# Patient Record
Sex: Female | Born: 1950 | Race: White | Hispanic: No | Smoking: Former smoker
Health system: Southern US, Community
[De-identification: ages and names within clinical notes are randomized; demographics above are authoritative.]

## PROBLEM LIST (undated history)

## (undated) DIAGNOSIS — M810 Age-related osteoporosis without current pathological fracture: Secondary | ICD-10-CM

## (undated) DIAGNOSIS — T7840XA Allergy, unspecified, initial encounter: Secondary | ICD-10-CM

## (undated) DIAGNOSIS — R112 Nausea with vomiting, unspecified: Secondary | ICD-10-CM

## (undated) DIAGNOSIS — Z9889 Other specified postprocedural states: Secondary | ICD-10-CM

## (undated) DIAGNOSIS — D689 Coagulation defect, unspecified: Secondary | ICD-10-CM

## (undated) DIAGNOSIS — M1990 Unspecified osteoarthritis, unspecified site: Secondary | ICD-10-CM

## (undated) DIAGNOSIS — R339 Retention of urine, unspecified: Secondary | ICD-10-CM

## (undated) DIAGNOSIS — C801 Malignant (primary) neoplasm, unspecified: Secondary | ICD-10-CM

## (undated) DIAGNOSIS — T8859XA Other complications of anesthesia, initial encounter: Secondary | ICD-10-CM

## (undated) DIAGNOSIS — Z8619 Personal history of other infectious and parasitic diseases: Secondary | ICD-10-CM

## (undated) DIAGNOSIS — D649 Anemia, unspecified: Secondary | ICD-10-CM

## (undated) DIAGNOSIS — R519 Headache, unspecified: Secondary | ICD-10-CM

## (undated) DIAGNOSIS — F32A Depression, unspecified: Secondary | ICD-10-CM

## (undated) DIAGNOSIS — F319 Bipolar disorder, unspecified: Secondary | ICD-10-CM

## (undated) HISTORY — DX: Personal history of other infectious and parasitic diseases: Z86.19

## (undated) HISTORY — DX: Coagulation defect, unspecified: D68.9

## (undated) HISTORY — DX: Allergy, unspecified, initial encounter: T78.40XA

## (undated) HISTORY — DX: Unspecified osteoarthritis, unspecified site: M19.90

## (undated) HISTORY — DX: Age-related osteoporosis without current pathological fracture: M81.0

## (undated) HISTORY — DX: Malignant (primary) neoplasm, unspecified: C80.1

## (undated) HISTORY — PX: FRACTURE SURGERY: SHX138

## (undated) HISTORY — DX: Depression, unspecified: F32.A

---

## 1973-09-24 DIAGNOSIS — I639 Cerebral infarction, unspecified: Secondary | ICD-10-CM

## 1973-09-24 HISTORY — DX: Cerebral infarction, unspecified: I63.9

## 1977-09-24 HISTORY — PX: ABDOMINAL HYSTERECTOMY: SHX81

## 1998-09-24 DIAGNOSIS — F319 Bipolar disorder, unspecified: Secondary | ICD-10-CM | POA: Insufficient documentation

## 2009-01-16 DIAGNOSIS — M5137 Other intervertebral disc degeneration, lumbosacral region: Secondary | ICD-10-CM | POA: Insufficient documentation

## 2015-10-11 DIAGNOSIS — F32A Depression, unspecified: Secondary | ICD-10-CM | POA: Insufficient documentation

## 2015-10-11 DIAGNOSIS — M1990 Unspecified osteoarthritis, unspecified site: Secondary | ICD-10-CM | POA: Insufficient documentation

## 2015-10-11 DIAGNOSIS — F329 Major depressive disorder, single episode, unspecified: Secondary | ICD-10-CM | POA: Insufficient documentation

## 2015-10-12 ENCOUNTER — Ambulatory Visit (INDEPENDENT_AMBULATORY_CARE_PROVIDER_SITE_OTHER): Payer: Medicare Other | Admitting: Family Medicine

## 2015-10-12 ENCOUNTER — Encounter: Payer: Self-pay | Admitting: Family Medicine

## 2015-10-12 VITALS — BP 90/60 | HR 76 | Temp 98.0°F | Resp 16 | Ht 65.0 in | Wt 133.0 lb

## 2015-10-12 DIAGNOSIS — Z1159 Encounter for screening for other viral diseases: Secondary | ICD-10-CM

## 2015-10-12 DIAGNOSIS — Z0000 Encounter for general adult medical examination without abnormal findings: Secondary | ICD-10-CM

## 2015-10-12 DIAGNOSIS — Z1239 Encounter for other screening for malignant neoplasm of breast: Secondary | ICD-10-CM

## 2015-10-12 NOTE — Progress Notes (Deleted)
Patient: Pamela Henson, Female    DOB: 1951/04/13, 65 y.o.   MRN: [id number] Visit Date: 10/12/2015  Today's Provider: Lelon Huh, MD   Chief Complaint  Patient presents with  . Annual Exam  . Follow-up    Bipolar affective disorder and Degeneration of Lunbar disc   Subjective:    Annual physical exam Pamela Henson is a 65 y.o. female who presents today for health maintenance and complete physical. She feels {DESC; WELL/FAIRLY WELL/POORLY:18703}. She reports exercising ***. She reports she is sleeping {DESC; WELL/FAIRLY WELL/POORLY:18703}.  ----------------------------------------------------------------- Follow up Bipolar affective disorder:  Last office visit was 4 years ago and no changes were made. This condition was followed by Dr. Caprice Beaver.   Follow up Degeneration of Lumbar disc:  Last office visit was 4 years ago and patient was started on Lidoderm patches.    Review of Systems  Social History      She  reports that she quit smoking about 17 years ago. She does not have any smokeless tobacco history on file. She reports that she does not drink alcohol or use illicit drugs.       Social History   Social History  . Marital Status: Widowed    Spouse Name: N/A  . Number of Children: N/A  . Years of Education: N/A   Social History Main Topics  . Smoking status: Former Smoker -- 15 years    Quit date: 09/24/1998  . Smokeless tobacco: Not on file  . Alcohol Use: No  . Drug Use: No  . Sexual Activity: Not on file   Other Topics Concern  . Not on file   Social History Narrative  . No narrative on file    Past Medical History  Diagnosis Date  . Bipolar disorder ([place])   . History of chicken pox   . History of measles   . History of mumps   . Depression   . Arthritis      Patient Active Problem List   Diagnosis Date Noted  . Arthritis 10/11/2015  . Clinical depression 10/11/2015  . Degeneration of lumbar or lumbosacral intervertebral disc  01/16/2009  . Bipolar affective disorder ([place]) 09/24/1998    Past Surgical History  Procedure Laterality Date  . Abdominal hysterectomy  1979    age 31 due to vaginal bleeding    Family History        Family Status  Relation Status Death Age  . Mother Deceased 54    stroke  . Father Deceased 43    stroke        Her family history includes Stroke (age of onset: 51) in her mother; Stroke (age of onset: 71) in her father.    Allergies no known allergies  Previous Medications   GABAPENTIN (NEURONTIN) 400 MG CAPSULE    Take 1 capsule by mouth daily.   LIDOCAINE (LIDODERM) 5 %    1-3 Patch for no more than 12 hours per day    Patient Care Team: Birdie Sons, MD as PCP - General (Family Medicine) Historical Provider, MD     Objective:   Vitals: There were no vitals taken for this visit.   Physical Exam   Depression Screen No flowsheet data found.    Assessment & Plan:     Routine Health Maintenance and Physical Exam  Exercise Activities and Dietary recommendations Goals    None       There is no immunization history on file  for this patient.  Health Maintenance  Topic Date Due  . Hepatitis C Screening  1951-09-03  . HIV Screening  12/24/1967  . TETANUS/TDAP  12/24/1971  . PAP SMEAR  12/23/1973  . MAMMOGRAM  12/24/2002  . COLONOSCOPY  12/24/2002  . ZOSTAVAX  12/23/2012  . INFLUENZA VACCINE  04/25/2015      Discussed health benefits of physical activity, and encouraged her to engage in regular exercise appropriate for her age and condition.    --------------------------------------------------------------------

## 2015-10-12 NOTE — Progress Notes (Signed)
Patient: Pamela Henson, Female    DOB: 04-06-1951, 65 y.o.   MRN: [id number] Visit Date: 10/12/2015  Today's Provider: Lelon Huh, MD   Chief Complaint  Patient presents with  . Annual Exam  . Follow-up    Bipolar affective disorder and Degeneration of Lunbar disc   Subjective:    Annual exam  Pamela Henson is a 65 y.o. female. She feels well. She reports exercising once a week dancing and walking. She reports she is sleeping fairly well.  ----------------------------------------------------------- Follow up Bipolar affective disorder/ Depression:  Last office visit was 4 years ago and no changes were made. This condition is followed by Dr. Caprice Beaver. Since last office visit, patient has been following up with Dr. Caprice Beaver every 3 months. Patient has been started on Zoloft and EnLyte. Gabapentin dosage was decreased. Patient feels her symptoms are now controlled with theses changes.  she has been intolerant to many antidepressants. She had [employer] testing to evaluate her potential genetic reactions to medications. She states that Dr. Caprice Beaver subsequent prescribed [facility] which is a folate replacement which has worked great for her. She has no other complaints today. Patient now sees Dr. Caprice Beaver every 6 months.    Follow up Degeneration of Lumbar disc:  Last office visit was 4 years ago and patient was started on Lidoderm patches. Patient states she has not used the Lidoderm patches for several years. Patient has been using OTC pain patches which has helped with back pain.    Review of Systems  Constitutional: Negative for fever, chills and fatigue.  HENT: Positive for congestion. Negative for ear pain, rhinorrhea, sneezing and sore throat.   Eyes: Negative.  Negative for pain and redness.  Respiratory: Positive for cough (productive with clear phlegm). Negative for shortness of breath and wheezing.   Cardiovascular: Negative for chest pain and leg swelling.    Gastrointestinal: Negative for nausea, abdominal pain, diarrhea, constipation and blood in stool.  Endocrine: Negative for polydipsia and polyphagia.  Genitourinary: Negative.  Negative for dysuria, hematuria, flank pain, vaginal bleeding, vaginal discharge and pelvic pain.  Musculoskeletal: Positive for back pain (stable). Negative for joint swelling, arthralgias and gait problem.  Skin: Negative for rash.  Neurological: Negative.  Negative for dizziness, tremors, seizures, weakness, light-headedness, numbness and headaches.  Hematological: Negative for adenopathy.  Psychiatric/Behavioral: Negative.  Negative for behavioral problems, confusion and dysphoric mood. The patient is not nervous/anxious and is not hyperactive.     Social History   Social History  . Marital Status: Widowed    Spouse Name: N/A  . Number of Children: 1  . Years of Education: N/A   Occupational History  . Disablity     Due to back problems   Social History Main Topics  . Smoking status: Former Smoker -- 15 years    Quit date: 09/24/1998  . Smokeless tobacco: Not on file  . Alcohol Use: No  . Drug Use: No  . Sexual Activity: Not on file   Other Topics Concern  . Not on file   Social History Narrative    Past Medical History  Diagnosis Date  . Bipolar disorder ([place])   . History of chicken pox   . History of measles   . History of mumps   . Depression   . Arthritis      Patient Active Problem List   Diagnosis Date Noted  . Arthritis 10/11/2015  . Clinical depression 10/11/2015  . Degeneration of lumbar  or lumbosacral intervertebral disc 01/16/2009  . Bipolar affective disorder ([place]) 09/24/1998    Past Surgical History  Procedure Laterality Date  . Abdominal hysterectomy  1979    age 53 due to vaginal bleeding    Her family history includes Stroke (age of onset: 48) in her mother; Stroke (age of onset: 34) in her father.    Previous Medications   DIETARY MANAGEMENT PRODUCT (ENLYTE  PO)    Take 1 capsule by mouth daily.   GABAPENTIN (NEURONTIN) 100 MG CAPSULE    Take 100 mg by mouth daily.   LIDOCAINE (LIDODERM) 5 %    Reported on 10/12/2015   SERTRALINE (ZOLOFT) 25 MG TABLET    Take 25 mg by mouth daily.    Patient Care Team: Birdie Sons, MD as PCP - General (Family Medicine) Historical Provider, MD Sheralyn Boatman, MD as Consulting Physician (Psychiatry)     Objective:   Vitals: BP 90/60 mmHg  Pulse 76  Temp(Src) 98 F (36.7 C) (Oral)  Resp 16  Ht 5\' 5"  (1.651 m)  Wt 133 lb (60.328 kg)  BMI 22.13 kg/m2  SpO2 93%  Physical Exam    General Appearance:    Alert, cooperative, no distress, appears stated age  Head:    Normocephalic, without obvious abnormality, atraumatic  Eyes:    PERRL, conjunctiva/corneas clear, EOM's intact, fundi    benign, both eyes  Ears:    Normal TM's and external ear canals, both ears  Nose:   Nares normal, septum midline, mucosa normal, no drainage    or sinus tenderness  Throat:   Lips, mucosa, and tongue normal; teeth and gums normal  Neck:   Supple, symmetrical, trachea midline, no adenopathy;    thyroid:  no enlargement/tenderness/nodules; no carotid   bruit or JVD  Back:     Symmetric, no curvature, ROM normal, no CVA tenderness  Lungs:     Clear to auscultation bilaterally, respirations unlabored  Chest Wall:    No tenderness or deformity   Heart:    Regular rate and rhythm, S1 and S2 normal, no murmur, rub   or gallop  Breast Exam:    normal appearance, no masses or tenderness  Abdomen:     Soft, non-tender, bowel sounds active all four quadrants,    no masses, no organomegaly  Pelvic:    deferred  Extremities:   Extremities normal, atraumatic, no cyanosis or edema  Pulses:   2+ and symmetric all extremities  Skin:   Skin color, texture, turgor normal, no rashes or lesions  Lymph nodes:   Cervical, supraclavicular, and axillary nodes normal  Neurologic:   CNII-XII intact, normal strength, sensation and  reflexes    throughout    Activities of Daily Living In your present state of health, do you have any difficulty performing the following activities: 10/12/2015  Hearing? N  Vision? N  Difficulty concentrating or making decisions? N  Walking or climbing stairs? N  Dressing or bathing? N  Doing errands, shopping? N    Fall Risk Assessment Fall Risk  10/12/2015  Falls in the past year? No     Depression Screen PHQ 2/9 Scores 10/12/2015  PHQ - 2 Score 0    Cognitive Testing - 6-CIT  Correct? Score   What year is it? yes 0 0 or 4  What month is it? yes 0 0 or 3  Memorize:    Pia Mau,  [address]  [place],  What time is it? (within 1 hour) yes 0 0 or 3  Count backwards from 20 yes 0 0, 2, or 4  Name the months of the year yes 0 0, 2, or 4  Repeat name & address above yes 0 0, 2, 4, 6, 8, or 10       TOTAL SCORE  0/28   Interpretation:  Normal  Normal (0-7) Abnormal (8-28)     Audit-C Alcohol Use Screening  Question Answer Points  How often do you have alcoholic drink? never 0  On days you do drink alcohol, how many drinks do you typically consume? 0 0  How oftey will you drink 6 or more in a total? never 0  Total Score:  0   A score of 3 or more in women, and 4 or more in men indicates increased risk for alcohol abuse, EXCEPT if all of the points are from question 1.   Assessment & Plan:     Annual Wellness Visit  Reviewed patient's Family Medical History Reviewed and updated list of patient's medical providers Assessment of cognitive impairment was done Assessed patient's functional ability Established a written schedule for health screening [place] Completed and Reviewed  Exercise Activities and Dietary recommendations Goals    None       There is no immunization history on file for this patient.  Health Maintenance  Topic Date Due  . Hepatitis C Screening  June 30, 1951  . HIV Screening  12/24/1967  . TETANUS/TDAP   12/24/1971  . PAP SMEAR  12/23/1973  . MAMMOGRAM  12/24/2002  . COLONOSCOPY  12/24/2002  . ZOSTAVAX  12/23/2012  . INFLUENZA VACCINE  12/23/2015 (Originally 04/25/2015)      Discussed health benefits of physical activity, and encouraged her to engage in regular exercise appropriate for her age and condition.    ------------------------------------------------------------------------------------------------------------  1. Annual physical exam Doing well.  - Basic metabolic panel - Lipid panel  2. Screening for breast cancer  - MM DIGITAL SCREENING BILATERAL; Future  3. Need for hepatitis C screening test  - Hepatitis C antibody

## 2015-10-13 ENCOUNTER — Encounter: Payer: Self-pay | Admitting: Family Medicine

## 2015-10-19 ENCOUNTER — Telehealth: Payer: Self-pay

## 2015-10-19 LAB — BASIC METABOLIC PANEL
BUN/Creatinine Ratio: 24 (ref 11–26)
BUN: 16 mg/dL (ref 8–27)
CO2: 21 mmol/L (ref 18–29)
CREATININE: 0.66 mg/dL (ref 0.57–1.00)
Calcium: 9.2 mg/dL (ref 8.7–10.3)
Chloride: 105 mmol/L (ref 96–106)
GFR calc Af Amer: 109 mL/min/{1.73_m2} (ref >59)
GFR, EST NON AFRICAN AMERICAN: 95 mL/min/{1.73_m2} (ref >59)
GLUCOSE: 107 mg/dL — AB (ref 65–99)
Potassium: 4.4 mmol/L (ref 3.5–5.2)
Sodium: 144 mmol/L (ref 134–144)

## 2015-10-19 LAB — LIPID PANEL
CHOL/HDL RATIO: 3.5 ratio (ref 0.0–4.4)
CHOLESTEROL TOTAL: 202 mg/dL — AB (ref 100–199)
HDL: 57 mg/dL (ref >39)
LDL CALC: 119 mg/dL — AB (ref 0–99)
TRIGLYCERIDES: 128 mg/dL (ref 0–149)
VLDL CHOLESTEROL CAL: 26 mg/dL (ref 5–40)

## 2015-10-19 LAB — HEPATITIS C ANTIBODY: Hep C Virus Ab: <0.1 s/co ratio (ref 0.0–0.9)

## 2015-10-19 NOTE — Telephone Encounter (Signed)
-----   Message from Birdie Sons, MD sent at 10/19/2015 12:34 PM EST ----- Blood sugar, kidney functions, electrolytes and cholesterol are all normal. Check labs yearly.

## 2015-10-19 NOTE — Telephone Encounter (Signed)
Pt advised.   Thanks,   -Layna Roeper  

## 2015-10-31 ENCOUNTER — Ambulatory Visit (HOSPITAL_COMMUNITY)
Admission: RE | Admit: 2015-10-31 | Discharge: 2015-10-31 | Disposition: A | Discharge status: Home / Self Care | Payer: Medicare Other | Source: Ambulatory Visit | Attending: Family Medicine | Admitting: Family Medicine

## 2015-10-31 DIAGNOSIS — Z1239 Encounter for other screening for malignant neoplasm of breast: Secondary | ICD-10-CM

## 2015-10-31 DIAGNOSIS — Z1231 Encounter for screening mammogram for malignant neoplasm of breast: Secondary | ICD-10-CM | POA: Diagnosis not present

## 2016-01-11 DIAGNOSIS — M9902 Segmental and somatic dysfunction of thoracic region: Secondary | ICD-10-CM | POA: Diagnosis not present

## 2016-01-11 DIAGNOSIS — M9903 Segmental and somatic dysfunction of lumbar region: Secondary | ICD-10-CM | POA: Diagnosis not present

## 2016-01-11 DIAGNOSIS — M9905 Segmental and somatic dysfunction of pelvic region: Secondary | ICD-10-CM | POA: Diagnosis not present

## 2016-01-11 DIAGNOSIS — M545 Low back pain: Secondary | ICD-10-CM | POA: Diagnosis not present

## 2016-03-14 DIAGNOSIS — H524 Presbyopia: Secondary | ICD-10-CM | POA: Diagnosis not present

## 2016-04-04 DIAGNOSIS — M545 Low back pain: Secondary | ICD-10-CM | POA: Diagnosis not present

## 2016-04-04 DIAGNOSIS — M9905 Segmental and somatic dysfunction of pelvic region: Secondary | ICD-10-CM | POA: Diagnosis not present

## 2016-04-04 DIAGNOSIS — M9903 Segmental and somatic dysfunction of lumbar region: Secondary | ICD-10-CM | POA: Diagnosis not present

## 2016-04-04 DIAGNOSIS — M9902 Segmental and somatic dysfunction of thoracic region: Secondary | ICD-10-CM | POA: Diagnosis not present

## 2016-05-23 DIAGNOSIS — N390 Urinary tract infection, site not specified: Secondary | ICD-10-CM | POA: Diagnosis not present

## 2016-05-23 DIAGNOSIS — N3000 Acute cystitis without hematuria: Secondary | ICD-10-CM | POA: Diagnosis not present

## 2016-05-25 ENCOUNTER — Encounter: Payer: Self-pay | Admitting: Family Medicine

## 2016-05-25 ENCOUNTER — Ambulatory Visit (INDEPENDENT_AMBULATORY_CARE_PROVIDER_SITE_OTHER): Payer: Medicare Other | Admitting: Family Medicine

## 2016-05-25 VITALS — BP 104/70 | HR 81 | Temp 98.1°F | Resp 16 | Ht 65.0 in | Wt 140.0 lb

## 2016-05-25 DIAGNOSIS — R350 Frequency of micturition: Secondary | ICD-10-CM | POA: Diagnosis not present

## 2016-05-25 DIAGNOSIS — N390 Urinary tract infection, site not specified: Secondary | ICD-10-CM | POA: Diagnosis not present

## 2016-05-25 LAB — POCT URINALYSIS DIPSTICK
Bilirubin, UA: NEGATIVE
GLUCOSE UA: NEGATIVE
NITRITE UA: NEGATIVE
Protein, UA: NEGATIVE
Spec Grav, UA: 1.01
UROBILINOGEN UA: 0.2
pH, UA: 7

## 2016-05-25 MED ORDER — SULFAMETHOXAZOLE-TRIMETHOPRIM 800-160 MG PO TABS: 1.0000 | ORAL_TABLET | Freq: Two times a day (BID) | ORAL | 0 refills | Status: AC

## 2016-05-25 MED ORDER — TAMSULOSIN HCL 0.4 MG PO CAPS: 0.4000 mg | ORAL_CAPSULE | Freq: Every day | ORAL | 0 refills | Status: DC

## 2016-05-25 NOTE — Progress Notes (Signed)
Patient: Pamela Henson Female    DOB: 03/17/1951   65 y.o.   MRN: [id number] Visit Date: 05/25/2016  Today's Provider: Lelon Huh, MD   Chief Complaint  Patient presents with  . Dysuria   Subjective:    Patient started having painful urination 5 days ago. Symptoms of pelvic pain, urgency, frequency and burning when voiding. Patient went to urgent care in [facility] Wednesday 05/23/2016 and was prescribed cipro. Patient states that she has had no relief.   Dysuria   This is a new problem. The current episode started in the past 7 days (6 days ago). The problem occurs every urination. The problem has been unchanged. The quality of the pain is described as burning, aching and stabbing. The pain is at a severity of 9/10. The pain is severe. There has been no fever. Associated symptoms include frequency. Pertinent negatives include no chills, discharge, flank pain, hematuria, hesitancy, nausea, possible pregnancy, sweats, urgency or vomiting. She has tried antibiotics (cipro 500) for the symptoms. The treatment provided no relief.  states she was up all night last night having to void several times with very little urine flow.     No Known Allergies Current Meds  Medication Sig  . Dietary Management Product (ENLYTE PO) Take 1 capsule by mouth daily.  [name] Kitchen gabapentin (NEURONTIN) 100 MG capsule Take 100 mg by mouth daily.  [name] Kitchen lidocaine (LIDODERM) 5 % Reported on 10/12/2015  . sertraline (ZOLOFT) 25 MG tablet Take 25 mg by mouth daily.    Review of Systems  Constitutional: Negative for appetite change, chills, fatigue and fever.  Respiratory: Negative for chest tightness and shortness of breath.   Cardiovascular: Negative for chest pain and palpitations.  Gastrointestinal: Negative for abdominal pain, nausea and vomiting.  Genitourinary: Positive for dysuria, frequency, pelvic pain and vaginal pain. Negative for flank pain, hematuria, hesitancy, urgency, vaginal bleeding and vaginal  discharge.  Neurological: Negative for dizziness and weakness.    Social History  Substance Use Topics  . Smoking status: Former Smoker    Packs/day: 1.00    Years: 15.00    Types: Cigarettes    Quit date: 09/24/1998  . Smokeless tobacco: Not on file  . Alcohol use No   Objective:   BP 104/70 (BP Location: Left Arm, Patient Position: Sitting, Cuff Size: Normal)   Pulse 81   Temp 98.1 F (36.7 C) (Oral)   Resp 16   Ht 5\' 5"  (1.651 m)   Wt 140 lb (63.5 kg)   SpO2 92%   BMI 23.30 kg/m   Physical Exam  General Appearance:    Alert, cooperative, no distress  Eyes:    PERRL, conjunctiva/corneas clear, EOM's intact       Lungs:     Clear to auscultation bilaterally, respirations unlabored  Heart:    Regular rate and rhythm  Abdomen:   bowel sounds present and normal in all 4 quadrants, soft, round or nontender. No CVA tenderness. Fullness of bladder.          Assessment & Plan:     1. Urinary frequency Unclear if this is entirely due to UTI or if she is having incomplete bladder emptying. Try empirical tamsulosin and change antibiotic as below.  - tamsulosin (FLOMAX) 0.4 MG CAPS capsule; Take 1 capsule (0.4 mg total) by mouth daily.  Dispense: 30 capsule; Refill: 0  2. Urinary tract infection without hematuria, site unspecified No improvement in symptoms on Cipro, will change antibiotic and  repeat culture - Urine culture - sulfamethoxazole-trimethoprim (BACTRIM DS,SEPTRA DS) 800-160 MG tablet; Take 1 tablet by mouth 2 (two) times daily.  Dispense: 14 tablet; Refill: 0  Advised to go to ER if abdominal swelling or pain worsens, or if she develops any fever, nausea, or vomiting. Advised to call if not greatly improved over the weekend. Consider pelvic imaging if symptoms not resolved.         Lelon Huh, MD  [facility]

## 2016-05-26 LAB — URINE CULTURE: ORGANISM ID, BACTERIA: NO GROWTH

## 2016-05-27 ENCOUNTER — Emergency Department (HOSPITAL_COMMUNITY): Payer: Medicare Other

## 2016-05-27 ENCOUNTER — Emergency Department (HOSPITAL_COMMUNITY)
Admission: EM | Admit: 2016-05-27 | Discharge: 2016-05-27 | Disposition: A | Discharge status: Home / Self Care | Payer: Medicare Other | Attending: Emergency Medicine | Admitting: Emergency Medicine

## 2016-05-27 ENCOUNTER — Encounter (HOSPITAL_COMMUNITY): Payer: Self-pay | Admitting: Emergency Medicine

## 2016-05-27 DIAGNOSIS — R339 Retention of urine, unspecified: Secondary | ICD-10-CM | POA: Diagnosis not present

## 2016-05-27 DIAGNOSIS — R350 Frequency of micturition: Secondary | ICD-10-CM

## 2016-05-27 DIAGNOSIS — Z79899 Other long term (current) drug therapy: Secondary | ICD-10-CM | POA: Insufficient documentation

## 2016-05-27 DIAGNOSIS — Z87891 Personal history of nicotine dependence: Secondary | ICD-10-CM | POA: Diagnosis not present

## 2016-05-27 DIAGNOSIS — R300 Dysuria: Secondary | ICD-10-CM | POA: Diagnosis present

## 2016-05-27 DIAGNOSIS — R1030 Lower abdominal pain, unspecified: Secondary | ICD-10-CM | POA: Insufficient documentation

## 2016-05-27 HISTORY — DX: Bipolar disorder, unspecified: F31.9

## 2016-05-27 LAB — URINALYSIS, ROUTINE W REFLEX MICROSCOPIC
Bilirubin Urine: NEGATIVE
GLUCOSE, UA: NEGATIVE mg/dL
Hgb urine dipstick: NEGATIVE
Ketones, ur: NEGATIVE mg/dL
LEUKOCYTES UA: NEGATIVE
Nitrite: NEGATIVE
PROTEIN: NEGATIVE mg/dL
Specific Gravity, Urine: <1.005 — ABNORMAL LOW (ref 1.005–1.030)
pH: 7 (ref 5.0–8.0)

## 2016-05-27 LAB — WET PREP, GENITAL
CLUE CELLS WET PREP: NONE SEEN
SPERM: NONE SEEN
Trich, Wet Prep: NONE SEEN
WBC WET PREP: NONE SEEN
YEAST WET PREP: NONE SEEN

## 2016-05-27 NOTE — ED Notes (Signed)
Pt c/o feeling like she can not urinate.  C/o lower abdominal pain.

## 2016-05-27 NOTE — ED Triage Notes (Signed)
Pt reports difficulty with urination, burning and pressure.  Pt was seen at walk in clinic and given cipro and flomax.  Pt still having symptoms at this time. Pt denies n/v/d.

## 2016-05-27 NOTE — Discharge Instructions (Signed)
Take your prescription antibiotic and flomax as previously directed.  Call the Urologist on Tuesday to schedule a follow up appointment this week.  Return to the Emergency Department immediately sooner if worsening.

## 2016-05-27 NOTE — ED Provider Notes (Signed)
[facility] DEPT Provider Note   CSN: [id number] Arrival date & time: 05/27/16  1237     History   Chief Complaint Chief Complaint  Patient presents with  . Dysuria    HPI Pamela Henson is a 65 y.o. female.  HPI  Pt was seen at 1320. Per pt, c/o gradual onset and persistence of constant urinary frequency, "pressure" and "burning" for the past 1 week. Pt was evaluated at [facility] last week for same, dx UTI, rx antibiotic. Pt states she followed up with her PMD 2 days ago, rx flomax and had her antibiotic changed. States she continues to have symptoms.  Denies back/flank pain, no fevers, no abd pain, no N/V/D, no vaginal bleeding/discharge, no rash.    Past Medical History:  Diagnosis Date  . Bipolar 1 disorder ([place])   . History of chicken pox   . History of measles   . History of mumps     Patient Active Problem List   Diagnosis Date Noted  . Arthritis 10/11/2015  . Clinical depression 10/11/2015  . Degeneration of lumbar or lumbosacral intervertebral disc 01/16/2009  . Bipolar affective disorder ([place]) 09/24/1998    Past Surgical History:  Procedure Laterality Date  . ABDOMINAL HYSTERECTOMY  1979   age 30 due to vaginal bleeding    OB History    Gravida Para Term Preterm AB Living   1 1           SAB TAB Ectopic Multiple Live Births                   Home Medications    Prior to Admission medications   Medication Sig Start Date End Date Taking? Authorizing Provider  Dietary Management Product (ENLYTE PO) Take 1 capsule by mouth daily.   Yes Historical Provider, MD  gabapentin (NEURONTIN) 100 MG capsule Take 100 mg by mouth at bedtime.    Yes Historical Provider, MD  sertraline (ZOLOFT) 25 MG tablet Take 25 mg by mouth at bedtime.    Yes Historical Provider, MD  sulfamethoxazole-trimethoprim (BACTRIM DS,SEPTRA DS) 800-160 MG tablet Take 1 tablet by mouth 2 (two) times daily. 05/25/16 06/01/16 Yes Birdie Sons, MD  tamsulosin (FLOMAX) 0.4 MG CAPS capsule Take 1  capsule (0.4 mg total) by mouth daily. 05/25/16  Yes Birdie Sons, MD    Family History Family History  Problem Relation Age of Onset  . Stroke Mother 20  . Stroke Father 48    Social History Social History  Substance Use Topics  . Smoking status: Former Smoker    Packs/day: 1.00    Years: 15.00    Types: Cigarettes    Quit date: 09/24/1998  . Smokeless tobacco: Not on file  . Alcohol use No     Allergies   Review of patient's allergies indicates no known allergies.   Review of Systems Review of Systems ROS: Statement: All systems negative except as marked or noted in the HPI; Constitutional: Negative for fever and chills. ; ; Eyes: Negative for eye pain, redness and discharge. ; ; ENMT: Negative for ear pain, hoarseness, nasal congestion, sinus pressure and sore throat. ; ; Cardiovascular: Negative for chest pain, palpitations, diaphoresis, dyspnea and peripheral edema. ; ; Respiratory: Negative for cough, wheezing and stridor. ; ; Gastrointestinal: Negative for nausea, vomiting, diarrhea, abdominal pain, blood in stool, hematemesis, jaundice and rectal bleeding. . ; ; Genitourinary: +urinary frequency, pressure, burning. Negative for flank pain and hematuria. ; ; GYN:  No pelvic  pain, no vaginal bleeding, no vaginal discharge, no vulvar pain.;; Musculoskeletal: Negative for back pain and neck pain. Negative for swelling and trauma.; ; Skin: Negative for pruritus, rash, abrasions, blisters, bruising and skin lesion.; ; Neuro: Negative for headache, lightheadedness and neck stiffness. Negative for weakness, altered level of consciousness, altered mental status, extremity weakness, paresthesias, involuntary movement, seizure and syncope.       Physical Exam Updated Vital Signs BP 130/75 (BP Location: Left Arm)   Pulse 108   Temp (!) 96.8 F (36 C) (Oral)   Resp 20   Ht 5\' 5"  (1.651 m)   Wt 140 lb (63.5 kg)   SpO2 100%   BMI 23.30 kg/m   Physical Exam 1325: Physical  examination:  Nursing notes reviewed; Vital signs and O2 SAT reviewed;  Constitutional: Well developed, Well nourished, Well hydrated, In no acute distress; Head:  Normocephalic, atraumatic; Eyes: EOMI, PERRL, No scleral icterus; ENMT: Mouth and pharynx normal, Mucous membranes moist; Neck: Supple, Full range of motion, No lymphadenopathy; Cardiovascular: Regular rate and rhythm, No gallop; Respiratory: Breath sounds clear & equal bilaterally, No wheezes.  Speaking full sentences with ease, Normal respiratory effort/excursion; Chest: Nontender, Movement normal; Abdomen: Soft, +suprapubic tenderness to palp. Normal bowel sounds; Genitourinary: No CVA tenderness; Spine:  No midline CS, TS, LS tenderness.;; Extremities: Pulses normal, No tenderness, No edema, No calf edema or asymmetry.; Neuro: AA&Ox3, Major CN grossly intact.  Speech clear. No gross focal motor or sensory deficits in extremities. Climbs on and off stretcher easily by herself. Gait steady.; Skin: Color normal, Warm, Dry.   ED Treatments / Results  Labs (all labs ordered are listed, but only abnormal results are displayed)   EKG  EKG Interpretation None       Radiology   Procedures Procedures (including critical care time)  Medications Ordered in ED Medications - No data to display   Initial Impression / Assessment and Plan / ED Course  I have reviewed the triage vital signs and the nursing notes.  Pertinent labs & imaging results that were available during my care of the patient were reviewed by me and considered in my medical decision making (see chart for details).   MDM Reviewed: previous chart, nursing note and vitals Interpretation: labs and CT scan   Results for orders placed or performed during the hospital encounter of 05/27/16  Wet prep, genital  Result Value Ref Range   Yeast Wet Prep HPF POC NONE SEEN NONE SEEN   Trich, Wet Prep NONE SEEN NONE SEEN   Clue Cells Wet Prep HPF POC NONE SEEN NONE SEEN    WBC, Wet Prep HPF POC NONE SEEN NONE SEEN   Sperm NONE SEEN   Urinalysis, Routine w reflex microscopic- may I&O cath if menses  Result Value Ref Range   Color, Urine YELLOW YELLOW   APPearance CLEAR CLEAR   Specific Gravity, Urine <1.005 (L) 1.005 - 1.030   pH 7.0 5.0 - 8.0   Glucose, UA NEGATIVE NEGATIVE mg/dL   Hgb urine dipstick NEGATIVE NEGATIVE   Bilirubin Urine NEGATIVE NEGATIVE   Ketones, ur NEGATIVE NEGATIVE mg/dL   Protein, ur NEGATIVE NEGATIVE mg/dL   Nitrite NEGATIVE NEGATIVE   Leukocytes, UA NEGATIVE NEGATIVE   Ct Renal Stone Study Result Date: 05/27/2016 CLINICAL DATA:  Two day history of difficulty urinating and lower abdominal pain. EXAM: CT ABDOMEN AND PELVIS WITHOUT CONTRAST TECHNIQUE: Multidetector CT imaging of the abdomen and pelvis was performed following the standard protocol without IV contrast. COMPARISON:  None. FINDINGS: Lower chest: The lung bases are clear of acute process. No pulmonary lesions or pleural effusion. The heart is normal in size. No pericardial effusion. The distal esophagus is grossly normal. Hepatobiliary: No focal hepatic lesions or intrahepatic biliary dilatation. The gallbladder is normal. No common bile duct dilatation. Pancreas: No mass, inflammation or duct dilatation. Spleen: Normal size.  No focal lesions. Adrenals/Urinary Tract: The adrenal glands are unremarkable. No renal, ureteral or bladder calculi. No hydroureteronephrosis. No renal lesions. Bladder is markedly distended extending up to the level of the umbilicus. No bladder mass or wall thickening. Stomach/Bowel: The stomach, duodenum, small bowel and colon are grossly normal without oral contrast. No inflammatory changes, mass lesions or obstructive findings. The terminal ileum is normal. The appendix is normal. Vascular/Lymphatic: No mesenteric or retroperitoneal mass or adenopathy. Advanced atherosclerotic calcifications involving aorta and iliac arteries. No aneurysm. Reproductive: Status  posthysterectomy. I believe both ovaries are still present and appear normal. Other: No inguinal mass or adenopathy. No abdominal wall hernia or subcutaneous lesions. Musculoskeletal: No significant bony findings. IMPRESSION: 1. Markedly distended bladder extending up to the level of the umbilicus. No bladder mass or calculi. 2. No renal or ureteral calculi or hydroureteronephrosis. 3. Advanced atherosclerotic calcifications involving the aorta and iliac arteries. Electronically Signed   By: Marijo Sanes M.D.   On: 05/27/2016 15:00    1525:  Pt went to CT scan before pelvic exam completed. CT scan with distended bladder, but otherwise reassuring. Upon her return: pt ambulated to the bathroom to urinate twice but continued to state she "still needed to go." Bladder scan with 250+ ml urine. I&O cath performed before pelvic exam: 1781ml urine drained. Pt immediately felt "all better." Pelvic exam performed with permission of pt and female ED RN assist during exam.  External genitalia w/o lesions. Vaginal vault without discharge.  Cervix surgically absent. Wet prep obtained and sent to lab. T/C to Uro Dr. Junious Silk, case discussed, including:  HPI, pertinent PM/SHx, VS/PE, dx testing, ED course and treatment:  Ok to continue flomax, FYI pt that zoloft also known to cause urinary retention, offer foley vs strict return precautions (repeat I&O cath), f/u office this week. Dx and testing, as well as d/w Uro MD, d/w pt and family.  Questions answered.  Verb understanding. Pt does not want foley catheter, agreeable to d/c home with outpt f/u and return to ED immediately sooner if developing urinary retention symptoms.     Final Clinical Impressions(s) / ED Diagnoses   Final diagnoses:  Frequency of urination    New Prescriptions New Prescriptions   No medications on file     Francine Graven, DO 05/30/16 1735

## 2016-05-29 ENCOUNTER — Emergency Department (HOSPITAL_COMMUNITY)
Admission: EM | Admit: 2016-05-29 | Discharge: 2016-05-29 | Disposition: A | Discharge status: Home / Self Care | Payer: Medicare Other | Attending: Emergency Medicine | Admitting: Emergency Medicine

## 2016-05-29 ENCOUNTER — Emergency Department (HOSPITAL_COMMUNITY): Payer: Medicare Other

## 2016-05-29 ENCOUNTER — Encounter (HOSPITAL_COMMUNITY): Payer: Self-pay | Admitting: Emergency Medicine

## 2016-05-29 ENCOUNTER — Telehealth: Payer: Self-pay

## 2016-05-29 DIAGNOSIS — Z87891 Personal history of nicotine dependence: Secondary | ICD-10-CM | POA: Diagnosis not present

## 2016-05-29 DIAGNOSIS — R339 Retention of urine, unspecified: Secondary | ICD-10-CM | POA: Insufficient documentation

## 2016-05-29 DIAGNOSIS — M5126 Other intervertebral disc displacement, lumbar region: Secondary | ICD-10-CM | POA: Diagnosis not present

## 2016-05-29 LAB — URINALYSIS, ROUTINE W REFLEX MICROSCOPIC
Bilirubin Urine: NEGATIVE
GLUCOSE, UA: NEGATIVE mg/dL
HGB URINE DIPSTICK: NEGATIVE
Ketones, ur: NEGATIVE mg/dL
LEUKOCYTES UA: NEGATIVE
Nitrite: NEGATIVE
PH: 6 (ref 5.0–8.0)
Protein, ur: NEGATIVE mg/dL
Specific Gravity, Urine: <1.005 — ABNORMAL LOW (ref 1.005–1.030)

## 2016-05-29 LAB — BASIC METABOLIC PANEL
ANION GAP: 7 (ref 5–15)
BUN: 8 mg/dL (ref 6–20)
CHLORIDE: 104 mmol/L (ref 101–111)
CO2: 25 mmol/L (ref 22–32)
Calcium: 9.1 mg/dL (ref 8.9–10.3)
Creatinine, Ser: 0.73 mg/dL (ref 0.44–1.00)
GFR calc Af Amer: >60 mL/min (ref >60)
GFR calc non Af Amer: >60 mL/min (ref >60)
GLUCOSE: 103 mg/dL — AB (ref 65–99)
POTASSIUM: 4.4 mmol/L (ref 3.5–5.1)
Sodium: 136 mmol/L (ref 135–145)

## 2016-05-29 MED ORDER — PHENAZOPYRIDINE HCL 100 MG PO TABS
100.0000 mg | ORAL_TABLET | Freq: Once | ORAL | Status: AC
  Administered 2016-05-29: 100 mg via ORAL
  Filled 2016-05-29: qty 1

## 2016-05-29 NOTE — Telephone Encounter (Signed)
Pt advised. Pt had to go to ED for urinary urgency. Is currently catheterized. Has FU with uro scheduled for next week. Renaldo Fiddler, CMA

## 2016-05-29 NOTE — ED Triage Notes (Signed)
Pt reports urinary retention x 1 week. States she was here on Sunday and 2L were drained from her bladder with in and out cath. States she is only able to urinate a tiny bit each time she goes. Pt also c/o pelvic pressure.

## 2016-05-29 NOTE — ED Provider Notes (Signed)
[facility] DEPT Provider Note   CSN: [id number] Arrival date & time: 05/29/16  [id number]     History   Chief Complaint Chief Complaint  Patient presents with  . Urinary Retention    HPI Pamela Henson is a 65 y.o. female. She presents again with urinary retention. She was having some dysuria last week and placed on an antibiotic by her primary care physician. Seen in the emergency room on Sunday and underwent straight cath for nearly 2 L of urine. Care was discussed with urology patient chose to attempt a voiding trial. She's been minimally able to void since that time and presents here with suprapubic discomfort.  HPI  Past Medical History:  Diagnosis Date  . Bipolar 1 disorder ([place])   . History of chicken pox   . History of measles   . History of mumps     Patient Active Problem List   Diagnosis Date Noted  . Arthritis 10/11/2015  . Clinical depression 10/11/2015  . Degeneration of lumbar or lumbosacral intervertebral disc 01/16/2009  . Bipolar affective disorder ([place]) 09/24/1998    Past Surgical History:  Procedure Laterality Date  . ABDOMINAL HYSTERECTOMY  1979   age 35 due to vaginal bleeding    OB History    Gravida Para Term Preterm AB Living   1 1           SAB TAB Ectopic Multiple Live Births                   Home Medications    Prior to Admission medications   Medication Sig Start Date End Date Taking? Authorizing Provider  Dietary Management Product (ENLYTE PO) Take 1 capsule by mouth daily.    Historical Provider, MD  gabapentin (NEURONTIN) 100 MG capsule Take 100 mg by mouth at bedtime.     Historical Provider, MD  sertraline (ZOLOFT) 25 MG tablet Take 25 mg by mouth at bedtime.     Historical Provider, MD  sulfamethoxazole-trimethoprim (BACTRIM DS,SEPTRA DS) 800-160 MG tablet Take 1 tablet by mouth 2 (two) times daily. 05/25/16 06/01/16  Birdie Sons, MD  tamsulosin (FLOMAX) 0.4 MG CAPS capsule Take 1 capsule (0.4 mg total) by mouth daily. 05/25/16    Birdie Sons, MD    Family History Family History  Problem Relation Age of Onset  . Stroke Mother 57  . Stroke Father 43    Social History Social History  Substance Use Topics  . Smoking status: Former Smoker    Packs/day: 1.00    Years: 15.00    Types: Cigarettes    Quit date: 09/24/1998  . Smokeless tobacco: Never Used  . Alcohol use No     Allergies   Review of patient's allergies indicates no known allergies.   Review of Systems Review of Systems  Constitutional: Negative for appetite change, chills, diaphoresis, fatigue and fever.  HENT: Negative for mouth sores, sore throat and trouble swallowing.   Eyes: Negative for visual disturbance.  Respiratory: Negative for cough, chest tightness, shortness of breath and wheezing.   Cardiovascular: Negative for chest pain.  Gastrointestinal: Negative for abdominal distention, abdominal pain, diarrhea, nausea and vomiting.  Endocrine: Negative for polydipsia, polyphagia and polyuria.  Genitourinary: Positive for decreased urine volume, difficulty urinating and frequency. Negative for dysuria and hematuria.  Musculoskeletal: Negative for gait problem.  Skin: Negative for color change, pallor and rash.  Neurological: Negative for dizziness, syncope, light-headedness and headaches.  Hematological: Does not bruise/bleed easily.  Psychiatric/Behavioral: Negative  for behavioral problems and confusion.     Physical Exam Updated Vital Signs BP 92/70 (BP Location: Left Arm)   Pulse 115   Temp 97.6 F (36.4 C) (Oral)   Resp 20   Ht 5\' 5"  (1.651 m)   Wt 140 lb (63.5 kg)   SpO2 95%   BMI 23.30 kg/m   Physical Exam  Constitutional: She is oriented to person, place, and time. She appears well-developed and well-nourished. No distress.  HENT:  Head: Normocephalic.  Eyes: Conjunctivae are normal. Pupils are equal, round, and reactive to light. No scleral icterus.  Neck: Normal range of motion. Neck supple. No thyromegaly  present.  Cardiovascular: Normal rate and regular rhythm.  Exam reveals no gallop and no friction rub.   No murmur heard. Pulmonary/Chest: Effort normal and breath sounds normal. No respiratory distress. She has no wheezes. She has no rales.  Abdominal: Soft. Bowel sounds are normal. She exhibits no distension. There is no tenderness. There is no rebound.  Musculoskeletal: Normal range of motion.  Neurological: She is alert and oriented to person, place, and time.  Skin: Skin is warm and dry. No rash noted.  Psychiatric: She has a normal mood and affect. Her behavior is normal.   rectal has good tone. No hard stool in the vault. Normal perineal sensation. Norma symmetric strength to flex/.extend hip and knees, dorsi/plantar flex ankles. Normal symmetric sensation to all distributions to LEs Patellar and achilles reflexes 1-2+. Downgoing Babinski    ED Treatments / Results  Labs (all labs ordered are listed, but only abnormal results are displayed) Labs Reviewed  URINALYSIS, ROUTINE W REFLEX MICROSCOPIC (NOT AT [facility]) - Abnormal; Notable for the following:       Result Value   Specific Gravity, Urine <1.005 (*)    All other components within normal limits  BASIC METABOLIC PANEL - Abnormal; Notable for the following:    Glucose, Bld 103 (*)    All other components within normal limits    EKG  EKG Interpretation None       Radiology Mr Lumbar Spine Wo Contrast  Result Date: 05/29/2016 CLINICAL DATA:  Urinary retention and bilateral lower extremity weakness for 1 week. EXAM: MRI LUMBAR SPINE WITHOUT CONTRAST TECHNIQUE: Multiplanar, multisequence MR imaging of the lumbar spine was performed. No intravenous contrast was administered. COMPARISON:  CT abdomen and pelvis 05/27/2016. FINDINGS: Segmentation:  Unremarkable. Alignment:  Trace anterolisthesis L4 on L5 is seen. Vertebrae: Small Schmorl's node in the inferior endplate of L2 is identified. No fracture or worrisome marrow lesion.  Conus medullaris: Extends to the L1-2: Level and appears normal. Paraspinal and other soft tissues: Unremarkable. Disc levels: T11-12 is imaged in the sagittal plane only and negative. T12-L1:  Negative. L1-2: Shallow right paracentral protrusion without central canal or foraminal narrowing. L2-3: Shallow disc bulge without central canal or foraminal narrowing. L3-4: Shallow disc bulge. The central canal and left foramen are widely patent. Mild right foraminal narrowing is noted. L4-5: Moderate facet arthropathy and a shallow disc bulge are seen. The central canal and foramina are widely patent. L5-S1: Shallow broad-based central protrusion contacts the descending right S1 root without compression or displacement. The foramina are open. IMPRESSION: No finding to explain the patient's symptoms. Mild right foraminal narrowing L3-4. Shallow broad-based central protrusion at L5-S1 contacts the descending right S1 root without compression or displacement. Electronically Signed   By: Inge Rise M.D.   On: 05/29/2016 11:34   Ct Renal Stone Study  Result Date: 05/27/2016  CLINICAL DATA:  Two day history of difficulty urinating and lower abdominal pain. EXAM: CT ABDOMEN AND PELVIS WITHOUT CONTRAST TECHNIQUE: Multidetector CT imaging of the abdomen and pelvis was performed following the standard protocol without IV contrast. COMPARISON:  None. FINDINGS: Lower chest: The lung bases are clear of acute process. No pulmonary lesions or pleural effusion. The heart is normal in size. No pericardial effusion. The distal esophagus is grossly normal. Hepatobiliary: No focal hepatic lesions or intrahepatic biliary dilatation. The gallbladder is normal. No common bile duct dilatation. Pancreas: No mass, inflammation or duct dilatation. Spleen: Normal size.  No focal lesions. Adrenals/Urinary Tract: The adrenal glands are unremarkable. No renal, ureteral or bladder calculi. No hydroureteronephrosis. No renal lesions. Bladder is  markedly distended extending up to the level of the umbilicus. No bladder mass or wall thickening. Stomach/Bowel: The stomach, duodenum, small bowel and colon are grossly normal without oral contrast. No inflammatory changes, mass lesions or obstructive findings. The terminal ileum is normal. The appendix is normal. Vascular/Lymphatic: No mesenteric or retroperitoneal mass or adenopathy. Advanced atherosclerotic calcifications involving aorta and iliac arteries. No aneurysm. Reproductive: Status posthysterectomy. I believe both ovaries are still present and appear normal. Other: No inguinal mass or adenopathy. No abdominal wall hernia or subcutaneous lesions. Musculoskeletal: No significant bony findings. IMPRESSION: 1. Markedly distended bladder extending up to the level of the umbilicus. No bladder mass or calculi. 2. No renal or ureteral calculi or hydroureteronephrosis. 3. Advanced atherosclerotic calcifications involving the aorta and iliac arteries. Electronically Signed   By: Marijo Sanes M.D.   On: 05/27/2016 15:00    Procedures Procedures (including critical care time)  Medications Ordered in ED Medications  phenazopyridine (PYRIDIUM) tablet 100 mg (100 mg Oral Given 05/29/16 1036)  phenazopyridine (PYRIDIUM) tablet 100 mg (100 mg Oral Given 05/29/16 1036)     Initial Impression / Assessment and Plan / ED Course  I have reviewed the triage vital signs and the nursing notes.  Pertinent labs & imaging results that were available during my care of the patient were reviewed by me and considered in my medical decision making (see chart for details).  Clinical Course    With acute urinary retention and constipation MRI was ordered to rule out neurogenic bladder. This is normal. We will stop her Zoloft. She has a bladder stretch injury and this will require catheter placement for at least 5-7 days. Constipation may be simply due to the size of her bladder which is now decompressed. However agree  with urological follow-up. Patient instructed in the use of her leg bag and catheter care.  Final Clinical Impressions(s) / ED Diagnoses   Final diagnoses:  Urinary retention    New Prescriptions New Prescriptions   No medications on file     Tanna Furry, MD 05/29/16 1236

## 2016-05-29 NOTE — ED Notes (Signed)
Pt c/o urinary retention. States she was seen in ED x 2 days ago, had bladder drained with in and out catheter and has been taking flomax at home with no relief. Pt tender over abdomen when obtaining bladder scan and c/o feeling pressure. Pt states she was able to urinate "a few drops" pta.

## 2016-05-29 NOTE — Telephone Encounter (Signed)
-----   Message from Birdie Sons, MD sent at 05/27/2016  8:39 AM EDT ----- Urine culture is negative. If not doing much better then need referral to urology for urinary retention/frequeny

## 2016-05-29 NOTE — ED Provider Notes (Signed)
[facility] DEPT Provider Note   CSN: [id number] Arrival date & time: 05/29/16  [id number]  By signing my name below, I, Pamela Henson, attest that this documentation has been prepared under the direction and in the presence of Tanna Furry, MD . Electronically Signed: Higinio Henson, Scribe. 05/29/2016. 10:27 AM.  History   Chief Complaint Chief Complaint  Patient presents with  . Urinary Retention   The history is provided by the patient. No language interpreter was used.   HPI Comments: Pamela Henson is a 65 y.o. female who presents to the Emergency Department complaining of gradually worsening, urinary retention that began 1 week ago and worsened today. Pt reports she went to a walk in clinic 5 days ago and was diagnosed with a UTI and then saw her PCP 4 days ago and was prescribed Flomax and another antibiotic with moderate relief. She notes her PCP did not take her off of Zoloft. She states she also visited the ED 2 days ago and received a CT scan which was negative and had a catheter placed in which 2L of fluid were drained from her bladder. She notes she is only able to urinate a "tiny bit" each time she goes. Pt also reports associated pelvic pressure that began this morning; she notes she is not in pain now in the ED. She notes she has also been constipated recently and has been taking Senna with no relief. She states she had 1 had BM yesterday and noticed some rectal bleeding. Pt reports she has also been more "thirsty" and dehydrated for the past 3 days in which she feels like she "can't get enough." She denies back pain, headache and increased clumsiness.   Past Medical History:  Diagnosis Date  . Bipolar 1 disorder ([place])   . History of chicken pox   . History of measles   . History of mumps     Patient Active Problem List   Diagnosis Date Noted  . Arthritis 10/11/2015  . Clinical depression 10/11/2015  . Degeneration of lumbar or lumbosacral intervertebral disc 01/16/2009  . Bipolar  affective disorder ([place]) 09/24/1998    Past Surgical History:  Procedure Laterality Date  . ABDOMINAL HYSTERECTOMY  1979   age 29 due to vaginal bleeding    OB History    Gravida Para Term Preterm AB Living   1 1           SAB TAB Ectopic Multiple Live Births                   Home Medications    Prior to Admission medications   Medication Sig Start Date End Date Taking? Authorizing Provider  Dietary Management Product (ENLYTE PO) Take 1 capsule by mouth daily.    Historical Provider, MD  gabapentin (NEURONTIN) 100 MG capsule Take 100 mg by mouth at bedtime.     Historical Provider, MD  sertraline (ZOLOFT) 25 MG tablet Take 25 mg by mouth at bedtime.     Historical Provider, MD  sulfamethoxazole-trimethoprim (BACTRIM DS,SEPTRA DS) 800-160 MG tablet Take 1 tablet by mouth 2 (two) times daily. 05/25/16 06/01/16  Birdie Sons, MD  tamsulosin (FLOMAX) 0.4 MG CAPS capsule Take 1 capsule (0.4 mg total) by mouth daily. 05/25/16   Birdie Sons, MD    Family History Family History  Problem Relation Age of Onset  . Stroke Mother 39  . Stroke Father 16    Social History Social History  Substance Use Topics  .  Smoking status: Former Smoker    Packs/day: 1.00    Years: 15.00    Types: Cigarettes    Quit date: 09/24/1998  . Smokeless tobacco: Never Used  . Alcohol use No     Allergies   Review of patient's allergies indicates no known allergies.  Review of Systems Review of Systems  Constitutional: Negative for appetite change, chills, diaphoresis, fatigue and fever.  HENT: Negative for mouth sores, sore throat and trouble swallowing.   Eyes: Negative for visual disturbance.  Respiratory: Negative for cough, chest tightness, shortness of breath and wheezing.   Cardiovascular: Negative for chest pain.  Gastrointestinal: Positive for constipation. Negative for abdominal distention, abdominal pain, diarrhea, nausea and vomiting.  Endocrine: Negative for polydipsia, polyphagia  and polyuria.  Genitourinary: Positive for decreased urine volume and pelvic pain. Negative for dysuria, frequency and hematuria.  Musculoskeletal: Negative for back pain and gait problem.  Skin: Negative for color change, pallor and rash.  Neurological: Negative for dizziness, syncope, light-headedness and headaches.  Hematological: Does not bruise/bleed easily.  Psychiatric/Behavioral: Negative for behavioral problems and confusion.   Physical Exam Updated Vital Signs BP 92/70 (BP Location: Left Arm)   Pulse 115   Temp 97.6 F (36.4 C) (Oral)   Resp 20   Ht 5\' 5"  (1.651 m)   Wt 140 lb (63.5 kg)   SpO2 95%   BMI 23.30 kg/m   Physical Exam  Constitutional: She is oriented to person, place, and time. She appears well-developed and well-nourished. No distress.  HENT:  Head: Normocephalic.  Eyes: Conjunctivae are normal. Pupils are equal, round, and reactive to light. No scleral icterus.  Neck: Normal range of motion. Neck supple. No thyromegaly present.  Cardiovascular: Normal rate and regular rhythm.  Exam reveals no gallop and no friction rub.   No murmur heard. Pulmonary/Chest: Effort normal and breath sounds normal. No respiratory distress. She has no wheezes. She has no rales.  Abdominal: Soft. Bowel sounds are normal. She exhibits no distension. There is no tenderness. There is no rebound.  Genitourinary:  Genitourinary Comments: Chaperone was present for exam which was performed with no discomfort or complications.  Normal sphincter tone on rectal exam   Musculoskeletal: Normal range of motion.  Neurological: She is alert and oriented to person, place, and time.  Normal senation to BLE   Skin: Skin is warm and dry. No rash noted.  Psychiatric: She has a normal mood and affect. Her behavior is normal.   ED Treatments / Results  Labs (all labs ordered are listed, but only abnormal results are displayed) Labs Reviewed  URINALYSIS, ROUTINE W REFLEX MICROSCOPIC (NOT AT  [facility])    EKG  EKG Interpretation None       Radiology Ct Renal Stone Study  Result Date: 05/27/2016 CLINICAL DATA:  Two day history of difficulty urinating and lower abdominal pain. EXAM: CT ABDOMEN AND PELVIS WITHOUT CONTRAST TECHNIQUE: Multidetector CT imaging of the abdomen and pelvis was performed following the standard protocol without IV contrast. COMPARISON:  None. FINDINGS: Lower chest: The lung bases are clear of acute process. No pulmonary lesions or pleural effusion. The heart is normal in size. No pericardial effusion. The distal esophagus is grossly normal. Hepatobiliary: No focal hepatic lesions or intrahepatic biliary dilatation. The gallbladder is normal. No common bile duct dilatation. Pancreas: No mass, inflammation or duct dilatation. Spleen: Normal size.  No focal lesions. Adrenals/Urinary Tract: The adrenal glands are unremarkable. No renal, ureteral or bladder calculi. No hydroureteronephrosis. No renal lesions.  Bladder is markedly distended extending up to the level of the umbilicus. No bladder mass or wall thickening. Stomach/Bowel: The stomach, duodenum, small bowel and colon are grossly normal without oral contrast. No inflammatory changes, mass lesions or obstructive findings. The terminal ileum is normal. The appendix is normal. Vascular/Lymphatic: No mesenteric or retroperitoneal mass or adenopathy. Advanced atherosclerotic calcifications involving aorta and iliac arteries. No aneurysm. Reproductive: Status posthysterectomy. I believe both ovaries are still present and appear normal. Other: No inguinal mass or adenopathy. No abdominal wall hernia or subcutaneous lesions. Musculoskeletal: No significant bony findings. IMPRESSION: 1. Markedly distended bladder extending up to the level of the umbilicus. No bladder mass or calculi. 2. No renal or ureteral calculi or hydroureteronephrosis. 3. Advanced atherosclerotic calcifications involving the aorta and iliac arteries.  Electronically Signed   By: Marijo Sanes M.D.   On: 05/27/2016 15:00    Procedures Procedures  DIAGNOSTIC STUDIES:  Oxygen Saturation is 95% on RA, normal by my interpretation.    COORDINATION OF CARE:  10:25 AM Discussed treatment Henson with pt at bedside and pt agreed to Henson.  Medications Ordered in ED Medications - No data to display   Initial Impression / Assessment and Henson / ED Course  I have reviewed the triage vital signs and the nursing notes.  Pertinent labs & imaging results that were available during my care of the patient were reviewed by me and considered in my medical decision making (see chart for details).  Clinical Course   With patient complaining of constipation a rectal exam was done. There is no stool in the fall. She does have good rectal tone and sensation. MRI does not show any lumbar sacral lesions that would suggest neurogenic bowel or bladder area constipation may have simply been from the size of her bladder. Symptoms are consistent with a bladder stretch injury. Vascular to tolerate the catheter for 5-7 days and follow-up with urology. We will stop her Zoloft. Astra contact her psychiatrist regarding this. Was instructed in use of the leg bag and Foley care.  I personally performed the services described in this documentation, which was scribed in my presence. The recorded information has been reviewed and is accurate.   Final Clinical Impressions(s) / ED Diagnoses   Final diagnoses:  None    New Prescriptions New Prescriptions   No medications on file     Tanna Furry, MD 05/29/16 1226

## 2016-05-29 NOTE — Discharge Instructions (Signed)
Stop zoloft.  Call urologist for appointment in 5-7 days.

## 2016-05-29 NOTE — ED Notes (Signed)
Foley converted to a leg bag

## 2016-06-04 DIAGNOSIS — R338 Other retention of urine: Secondary | ICD-10-CM | POA: Diagnosis not present

## 2016-06-12 DIAGNOSIS — R338 Other retention of urine: Secondary | ICD-10-CM | POA: Diagnosis not present

## 2016-06-13 ENCOUNTER — Telehealth: Payer: Self-pay | Admitting: Family Medicine

## 2016-06-13 DIAGNOSIS — K648 Other hemorrhoids: Secondary | ICD-10-CM

## 2016-06-13 NOTE — Telephone Encounter (Signed)
Pt called saying she was in to see you the first of September.  She is having two problems, one with bladder and also with hemorrhoids.  She now has a catheter for her bladder and is supposed to have URO dynamics bladder test Oct 6th.  She says the pain with her hemorrhoids is the worse right now.  She says the Urologist thinks they are related.  She would like to get your opinion.  Her call back is [phone]

## 2016-06-13 NOTE — Telephone Encounter (Signed)
I don't know of any way they would be related, does she need prescription for hemorrhoid cream?

## 2016-06-14 NOTE — Telephone Encounter (Signed)
Patient was notified. Patient stated that she would like to have something sent to pharmacy.

## 2016-06-15 MED ORDER — HYDROCORTISONE ACETATE 25 MG RE SUPP: 25.0000 mg | Freq: Two times a day (BID) | RECTAL | 0 refills | Status: DC

## 2016-06-15 NOTE — Telephone Encounter (Signed)
Her hemorrhoids are internal.   She uses [facility] in [place].  ThanksTeri

## 2016-06-15 NOTE — Telephone Encounter (Signed)
Are they external or internal hemorrhoids?

## 2016-06-15 NOTE — Telephone Encounter (Signed)
Please advise 

## 2016-06-15 NOTE — Telephone Encounter (Signed)
Sent in anusol suppository for Dr. Rosanna Randy

## 2016-06-15 NOTE — Telephone Encounter (Signed)
Dr Rosanna Randy left and I am leaving can you review this-aa

## 2016-06-16 NOTE — Telephone Encounter (Signed)
thanks

## 2016-07-04 DIAGNOSIS — R338 Other retention of urine: Secondary | ICD-10-CM | POA: Diagnosis not present

## 2016-07-18 DIAGNOSIS — R338 Other retention of urine: Secondary | ICD-10-CM | POA: Diagnosis not present

## 2016-07-18 DIAGNOSIS — R350 Frequency of micturition: Secondary | ICD-10-CM | POA: Diagnosis not present

## 2016-08-03 DIAGNOSIS — R350 Frequency of micturition: Secondary | ICD-10-CM | POA: Diagnosis not present

## 2016-08-03 DIAGNOSIS — R338 Other retention of urine: Secondary | ICD-10-CM | POA: Diagnosis not present

## 2016-09-13 DIAGNOSIS — R350 Frequency of micturition: Secondary | ICD-10-CM | POA: Diagnosis not present

## 2016-09-13 DIAGNOSIS — R338 Other retention of urine: Secondary | ICD-10-CM | POA: Diagnosis not present

## 2016-11-06 DIAGNOSIS — R338 Other retention of urine: Secondary | ICD-10-CM | POA: Diagnosis not present

## 2016-11-06 DIAGNOSIS — R350 Frequency of micturition: Secondary | ICD-10-CM | POA: Diagnosis not present

## 2017-05-08 DIAGNOSIS — R350 Frequency of micturition: Secondary | ICD-10-CM | POA: Diagnosis not present

## 2017-05-08 DIAGNOSIS — R338 Other retention of urine: Secondary | ICD-10-CM | POA: Diagnosis not present

## 2017-07-10 DIAGNOSIS — M545 Low back pain: Secondary | ICD-10-CM | POA: Diagnosis not present

## 2017-07-10 DIAGNOSIS — M9902 Segmental and somatic dysfunction of thoracic region: Secondary | ICD-10-CM | POA: Diagnosis not present

## 2017-07-10 DIAGNOSIS — M9903 Segmental and somatic dysfunction of lumbar region: Secondary | ICD-10-CM | POA: Diagnosis not present

## 2017-07-10 DIAGNOSIS — M9905 Segmental and somatic dysfunction of pelvic region: Secondary | ICD-10-CM | POA: Diagnosis not present

## 2017-07-20 DIAGNOSIS — Z0000 Encounter for general adult medical examination without abnormal findings: Secondary | ICD-10-CM | POA: Diagnosis not present

## 2017-07-20 DIAGNOSIS — Z1211 Encounter for screening for malignant neoplasm of colon: Secondary | ICD-10-CM | POA: Diagnosis not present

## 2017-07-20 LAB — FECAL OCCULT BLOOD, GUAIAC: Fecal Occult Blood: POSITIVE

## 2017-07-29 IMAGING — CT CT RENAL STONE PROTOCOL
2 of 4 series · 16 of 46 positions shown, 18 images · non-contrast
Comparison: None.

CLINICAL DATA: Two day history of difficulty urinating and lower
abdominal pain.

EXAM:
CT ABDOMEN AND PELVIS WITHOUT CONTRAST
TECHNIQUE: Multidetector CT imaging of the abdomen and pelvis was performed
following the standard protocol without IV contrast.

[Series 2: routine abd pel with · axial · 0.71mm/px · z∈[-446,-41]mm · 13 of 89 slices shown, 15 images]
[im 4/89  soft-tissue]
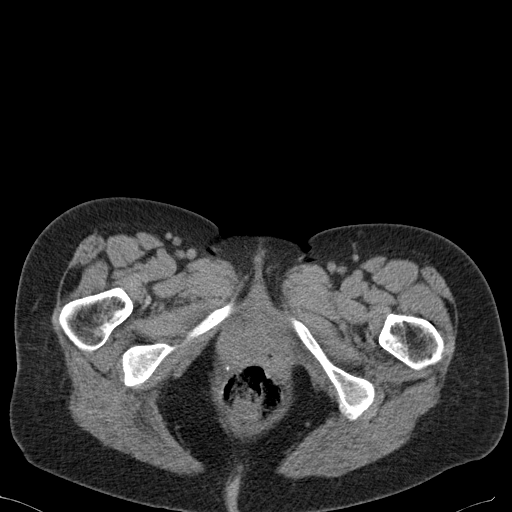
[im 4/89  bone]
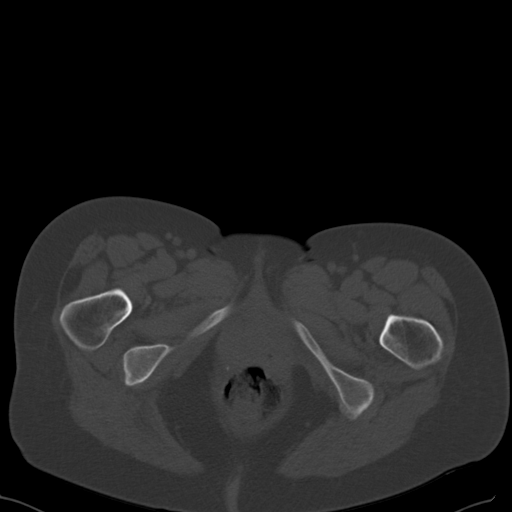
[im 12/89  soft-tissue]
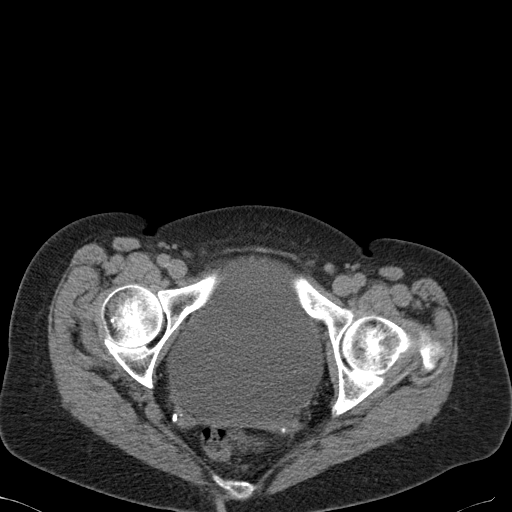
[im 19/89  soft-tissue]
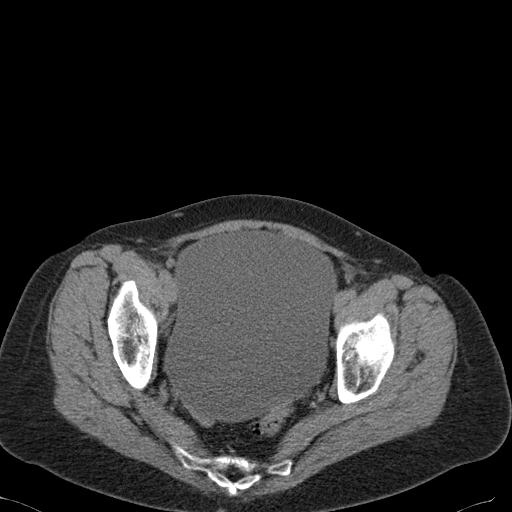
[im 26/89  soft-tissue]
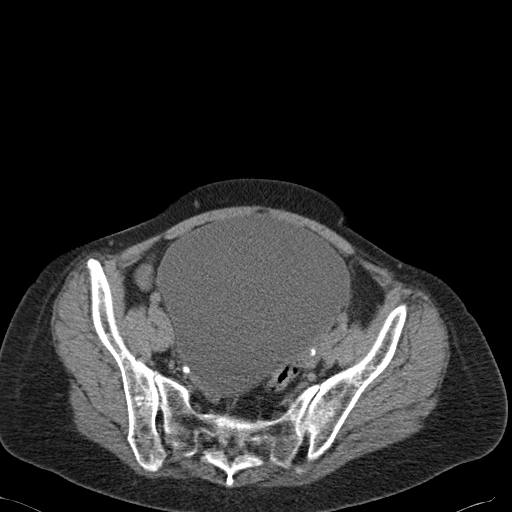
[im 30/89  soft-tissue]
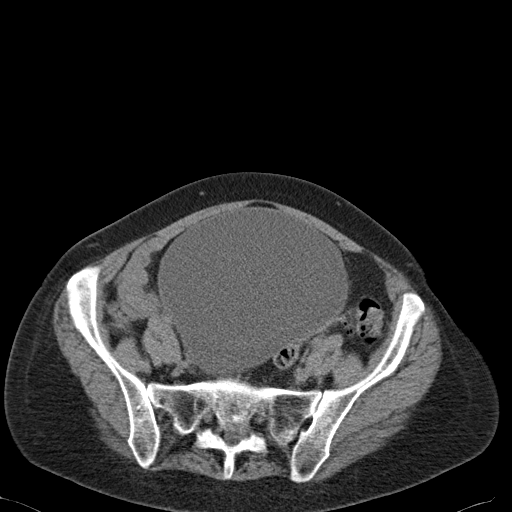
[im 37/89  soft-tissue]
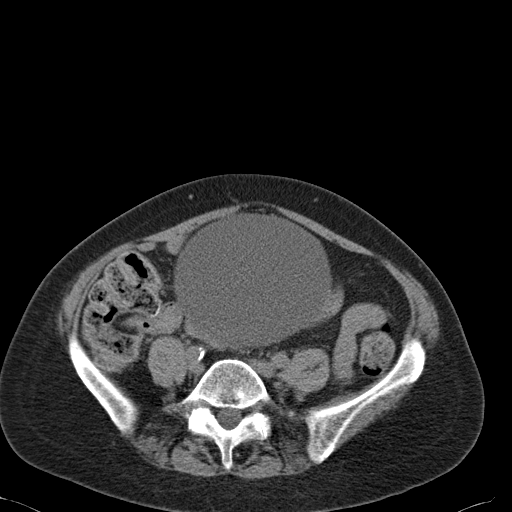
[im 45/89  soft-tissue]
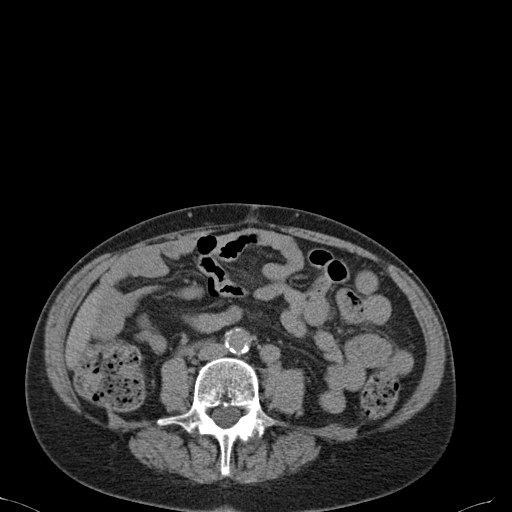
[im 52/89  soft-tissue]
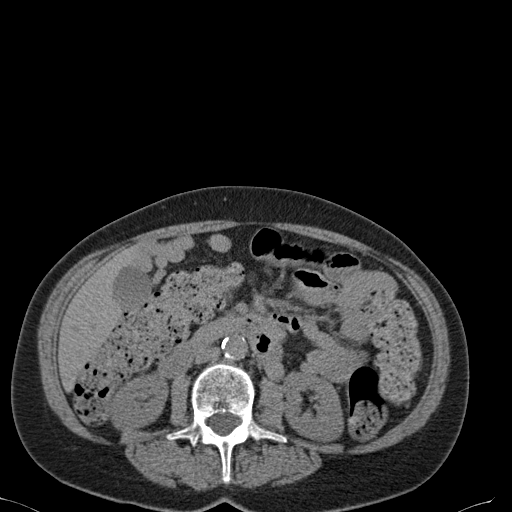
[im 59/89  soft-tissue]
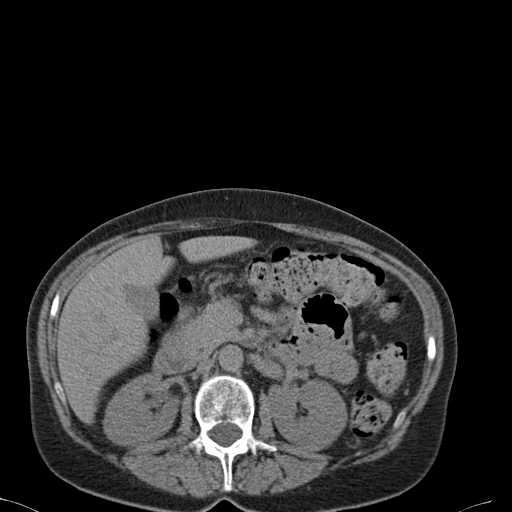
[im 59/89  bone]
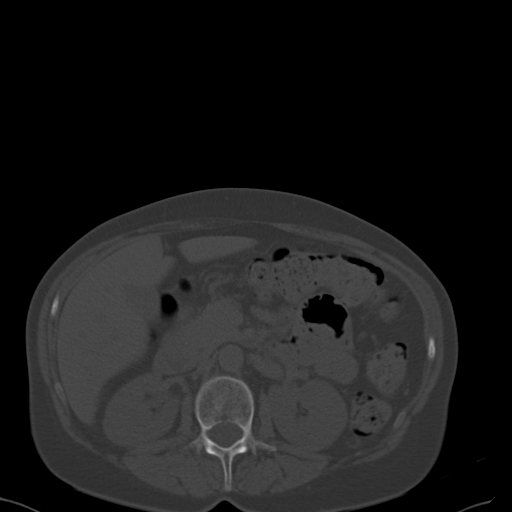
[im 63/89  soft-tissue]
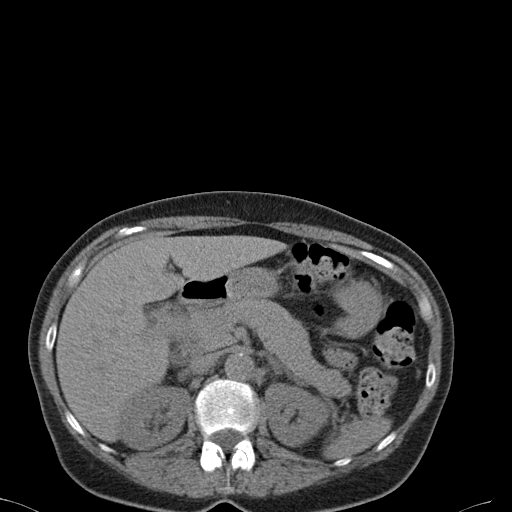
[im 70/89  soft-tissue]
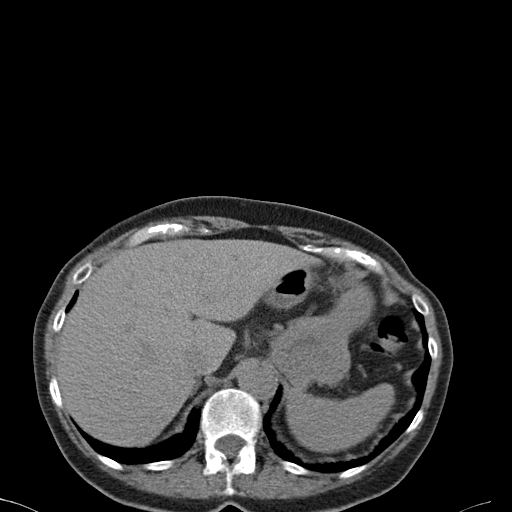
[im 78/89  soft-tissue]
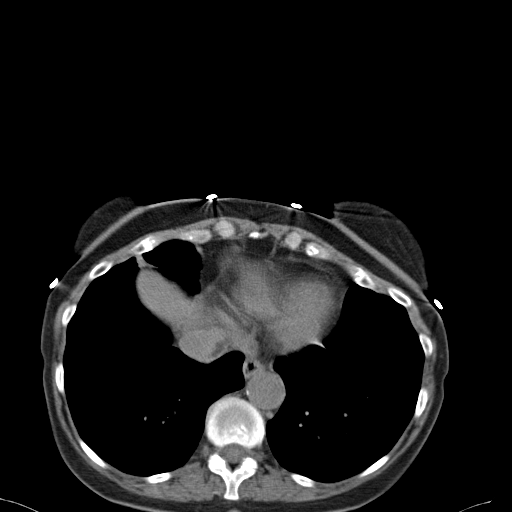
[im 85/89  soft-tissue]
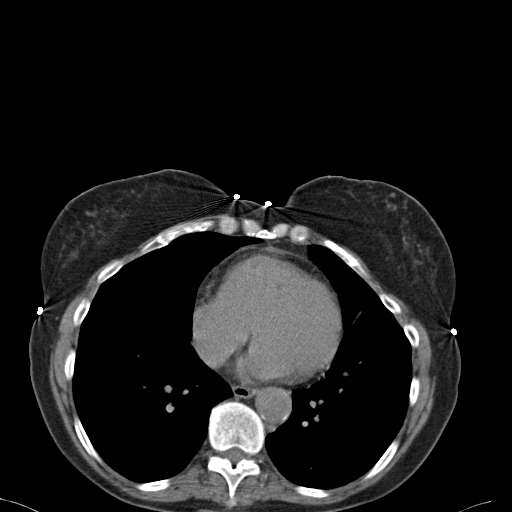

[Series 3: coronal · coronal · 0.81mm/px · 3 of 143 slices shown]
[im 48/143  soft-tissue]
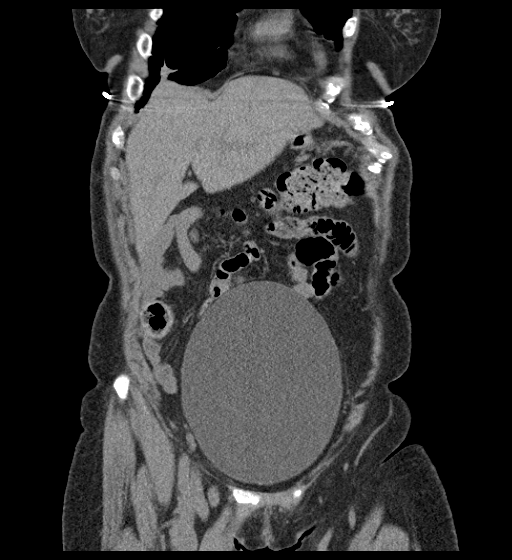
[im 64/143  soft-tissue]
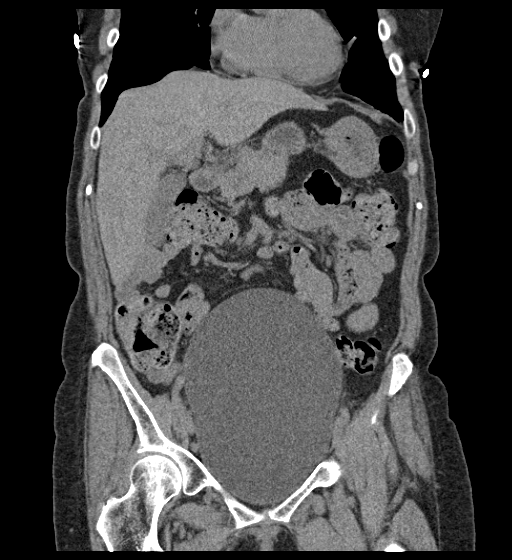
[im 79/143  soft-tissue]
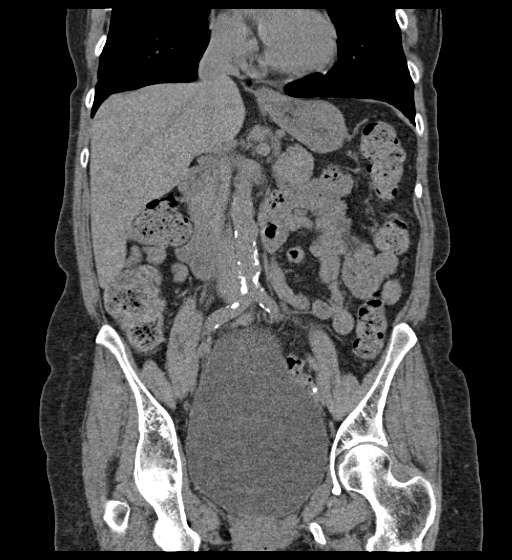

[16 of 46 positions shown; findings below may reference images not displayed]

FINDINGS: Lower chest: The lung bases are clear of acute process. No pulmonary
lesions or pleural effusion. The heart is normal in size. No
pericardial effusion. The distal esophagus is grossly normal.

Hepatobiliary: No focal hepatic lesions or intrahepatic biliary
dilatation. The gallbladder is normal. No common bile duct
dilatation.

Pancreas: No mass, inflammation or duct dilatation.

Spleen: Normal size.  No focal lesions.

Adrenals/Urinary Tract: The adrenal glands are unremarkable.

No renal, ureteral or bladder calculi. No hydroureteronephrosis. No
renal lesions.

Bladder is markedly distended extending up to the level of the
umbilicus. No bladder mass or wall thickening.

Stomach/Bowel: The stomach, duodenum, small bowel and colon are
grossly normal without oral contrast. No inflammatory changes, mass
lesions or obstructive findings. The terminal ileum is normal. The
appendix is normal.

Vascular/Lymphatic: No mesenteric or retroperitoneal mass or
adenopathy. Advanced atherosclerotic calcifications involving aorta
and iliac arteries. No aneurysm.

Reproductive: Status posthysterectomy. I believe both ovaries are
still present and appear normal.

Other: No inguinal mass or adenopathy. No abdominal wall hernia or
subcutaneous lesions.

Musculoskeletal: No significant bony findings.
IMPRESSION: 1. Markedly distended bladder extending up to the level of the
umbilicus. No bladder mass or calculi.
2. No renal or ureteral calculi or hydroureteronephrosis.
3. Advanced atherosclerotic calcifications involving the aorta and
iliac arteries.

## 2017-08-14 ENCOUNTER — Telehealth: Payer: Self-pay | Admitting: Family Medicine

## 2017-08-14 DIAGNOSIS — R195 Other fecal abnormalities: Secondary | ICD-10-CM | POA: Insufficient documentation

## 2017-08-14 NOTE — Telephone Encounter (Signed)
Patient was notified. Referral in epic.  

## 2017-08-14 NOTE — Telephone Encounter (Signed)
Please advise patient we received copy of stool test results which was positive for blood. She needs referral to GI for further evaluation.

## 2017-08-14 NOTE — Telephone Encounter (Signed)
LMTCB ED 

## 2017-08-14 NOTE — Telephone Encounter (Signed)
Please refer to GI. Thanks!

## 2017-08-21 ENCOUNTER — Encounter (INDEPENDENT_AMBULATORY_CARE_PROVIDER_SITE_OTHER): Payer: Self-pay | Admitting: Internal Medicine

## 2017-10-01 ENCOUNTER — Ambulatory Visit (INDEPENDENT_AMBULATORY_CARE_PROVIDER_SITE_OTHER): Payer: Medicare Other | Admitting: Internal Medicine

## 2017-10-07 NOTE — Telephone Encounter (Signed)
Patient states she had to reschedule GI appointment. She has appt with [facility] 10/10/2017.

## 2017-10-07 NOTE — Telephone Encounter (Signed)
Patient was referred to GI in November for positive stool test, but I don't have records that GI ever saw her. Please check with patient if she ever saw GI. If not we need to redo referral. Thanks.

## 2017-10-10 ENCOUNTER — Telehealth (INDEPENDENT_AMBULATORY_CARE_PROVIDER_SITE_OTHER): Payer: Self-pay | Admitting: *Deleted

## 2017-10-10 ENCOUNTER — Encounter (INDEPENDENT_AMBULATORY_CARE_PROVIDER_SITE_OTHER): Payer: Self-pay | Admitting: Internal Medicine

## 2017-10-10 ENCOUNTER — Encounter (INDEPENDENT_AMBULATORY_CARE_PROVIDER_SITE_OTHER): Payer: Self-pay | Admitting: *Deleted

## 2017-10-10 ENCOUNTER — Ambulatory Visit (INDEPENDENT_AMBULATORY_CARE_PROVIDER_SITE_OTHER): Payer: Medicare Other | Admitting: Internal Medicine

## 2017-10-10 VITALS — BP 128/78 | HR 80 | Temp 97.7°F | Ht 65.0 in | Wt 141.6 lb

## 2017-10-10 DIAGNOSIS — R195 Other fecal abnormalities: Secondary | ICD-10-CM | POA: Diagnosis not present

## 2017-10-10 MED ORDER — PEG 3350-KCL-NA BICARB-NACL 420 G PO SOLR: 4000.0000 mL | Freq: Once | ORAL | 0 refills | Status: AC

## 2017-10-10 NOTE — Patient Instructions (Signed)
Colonoscopy. The risks of bleeding, perforation and infection were reviewed with patient.  

## 2017-10-10 NOTE — Progress Notes (Addendum)
   Subjective:    Patient ID: Pamela Henson, female    DOB: [birth year], 67 y.o.   MRN: [id number]  HPI Referred by Dr. Lelon Huh for positive stool card. She denies seeing any blood in her stools. She leans toward constipation.  Stools are usually small and hard. Change in stool for about a year. She says in 2017, her bladder stopped working. She says as long as she takes the Flomax she has no trouble voiding. Her appetite is okay. No weight loss. She has never had a colonoscopy.  No family hx of colon cancer.    Review of Systems Past Medical History:  Diagnosis Date  . Bipolar 1 disorder ([place])   . History of chicken pox   . History of measles   . History of mumps     Past Surgical History:  Procedure Laterality Date  . ABDOMINAL HYSTERECTOMY  1979   age 8 due to vaginal bleeding    No Known Allergies  Current Outpatient Medications on File Prior to Visit  Medication Sig Dispense Refill  . Dietary Management Product (ENLYTE PO) Take 1 capsule by mouth daily.    [name] Kitchen gabapentin (NEURONTIN) 100 MG capsule Take 100 mg by mouth at bedtime.     . sertraline (ZOLOFT) 25 MG tablet Take 25 mg by mouth at bedtime.     . tamsulosin (FLOMAX) 0.4 MG CAPS capsule Take 1 capsule (0.4 mg total) by mouth daily. 30 capsule 0   No current facility-administered medications on file prior to visit.         Objective:   Physical Exam Vitals:   10/10/17 1345  Weight: 141 lb 9.6 oz (64.2 kg)  Height: 5\' 5"  (1.651 m)   Alert and oriented. Skin warm and dry. Oral mucosa is moist.   . Sclera anicteric, conjunctivae is pink. Thyroid not enlarged. No cervical lymphadenopathy. Lungs clear. Heart regular rate and rhythm.  Abdomen is soft. Bowel sounds are positive. No hepatomegaly. No abdominal masses felt. No tenderness.  No edema to lower extremities.  Stool brown an guaiac negative. No rectal masses felt.          Assessment & Plan:  Positive stool card. Colonic neoplasm needs to be ruled  out. Hemorrhoids, Diverticula, ulcer also needs to be ruled out.

## 2017-10-10 NOTE — Telephone Encounter (Signed)
Patient needs trilyte 

## 2017-10-30 ENCOUNTER — Encounter (HOSPITAL_COMMUNITY)
Admission: RE | Disposition: A | Discharge status: Home / Self Care | Payer: Self-pay | Source: Ambulatory Visit | Attending: Internal Medicine

## 2017-10-30 ENCOUNTER — Encounter (HOSPITAL_COMMUNITY): Payer: Self-pay | Admitting: *Deleted

## 2017-10-30 ENCOUNTER — Other Ambulatory Visit: Payer: Self-pay

## 2017-10-30 ENCOUNTER — Ambulatory Visit (HOSPITAL_COMMUNITY)
Admission: RE | Admit: 2017-10-30 | Discharge: 2017-10-30 | Disposition: A | Discharge status: Home / Self Care | Payer: Medicare Other | Source: Ambulatory Visit | Attending: Internal Medicine | Admitting: Internal Medicine

## 2017-10-30 DIAGNOSIS — Z7982 Long term (current) use of aspirin: Secondary | ICD-10-CM | POA: Insufficient documentation

## 2017-10-30 DIAGNOSIS — Z87891 Personal history of nicotine dependence: Secondary | ICD-10-CM | POA: Diagnosis not present

## 2017-10-30 DIAGNOSIS — K5730 Diverticulosis of large intestine without perforation or abscess without bleeding: Secondary | ICD-10-CM | POA: Insufficient documentation

## 2017-10-30 DIAGNOSIS — F319 Bipolar disorder, unspecified: Secondary | ICD-10-CM | POA: Insufficient documentation

## 2017-10-30 DIAGNOSIS — Z79899 Other long term (current) drug therapy: Secondary | ICD-10-CM | POA: Insufficient documentation

## 2017-10-30 DIAGNOSIS — R195 Other fecal abnormalities: Secondary | ICD-10-CM | POA: Diagnosis not present

## 2017-10-30 DIAGNOSIS — D123 Benign neoplasm of transverse colon: Secondary | ICD-10-CM | POA: Insufficient documentation

## 2017-10-30 DIAGNOSIS — K644 Residual hemorrhoidal skin tags: Secondary | ICD-10-CM | POA: Insufficient documentation

## 2017-10-30 HISTORY — PX: COLONOSCOPY: SHX5424

## 2017-10-30 SURGERY — COLONOSCOPY
Anesthesia: Moderate Sedation

## 2017-10-30 MED ORDER — MIDAZOLAM HCL 5 MG/5ML IJ SOLN
INTRAMUSCULAR | Status: AC
  Filled 2017-10-30: qty 10

## 2017-10-30 MED ORDER — MEPERIDINE HCL 50 MG/ML IJ SOLN
INTRAMUSCULAR | Status: AC
  Filled 2017-10-30: qty 1

## 2017-10-30 MED ORDER — MEPERIDINE HCL 50 MG/ML IJ SOLN
INTRAMUSCULAR | Status: DC | PRN
  Administered 2017-10-30 (×2): 25 mg

## 2017-10-30 MED ORDER — MIDAZOLAM HCL 5 MG/5ML IJ SOLN
INTRAMUSCULAR | Status: DC | PRN
  Administered 2017-10-30: 2 mg via INTRAVENOUS
  Administered 2017-10-30: 1 mg via INTRAVENOUS
  Administered 2017-10-30: 2 mg via INTRAVENOUS
  Administered 2017-10-30: 1 mg via INTRAVENOUS

## 2017-10-30 MED ORDER — SODIUM CHLORIDE 0.9 % IV SOLN
INTRAVENOUS | Status: DC
  Administered 2017-10-30: 12:00:00 via INTRAVENOUS

## 2017-10-30 NOTE — H&P (Signed)
Pamela Henson is an 67 y.o. female.   Chief Complaint: Patient is here for colonoscopy. HPI: Patient is 67 year old Caucasian female who was recently found to have heme positive stool.  She denies melena or rectal bleeding.  She also denies abdominal pain.  States she developed hemorrhoids when she developed urinary retention and required catheter for few days.  She does not bleed from hemorrhoids.  She has good appetite and her weight has been stable.  She takes ibuprofen and/or aspirin on as needed basis but not very frequently.  She may take aspirin twice a month or so.  Patient has never been screened for CRC. Family history is negative for CRC.  Past Medical History:  Diagnosis Date  . Bipolar 1 disorder ([place])   . Bipolar 1 disorder ([place])   . History of chicken pox   . History of measles   . History of mumps     Past Surgical History:  Procedure Laterality Date  . ABDOMINAL HYSTERECTOMY  1979   age 89 due to vaginal bleeding    Family History  Problem Relation Age of Onset  . Stroke Mother 67  . Stroke Father 75  . Colon cancer Neg Hx    Social History:  reports that she quit smoking about 19 years ago. Her smoking use included cigarettes. She has a 15.00 pack-year smoking history. she has never used smokeless tobacco. She reports that she does not drink alcohol or use drugs.  Allergies: No Known Allergies  Medications Prior to Admission  Medication Sig Dispense Refill  . aspirin 325 MG EC tablet Take 325 mg by mouth daily as needed for pain.    . Dietary Management Product (ENLYTE PO) Take 1 capsule by mouth daily.    [name] Kitchen gabapentin (NEURONTIN) 100 MG capsule Take 100 mg by mouth at bedtime.     [name] Kitchen ibuprofen (ADVIL,MOTRIN) 200 MG tablet Take 200 mg by mouth every 8 (eight) hours as needed for mild pain.    [name] Kitchen sertraline (ZOLOFT) 25 MG tablet Take 12.5 mg by mouth at bedtime.     . tamsulosin (FLOMAX) 0.4 MG CAPS capsule Take 1 capsule (0.4 mg total) by mouth daily. (Patient  taking differently: Take 0.4 mg by mouth at bedtime. ) 30 capsule 0  . zaleplon (SONATA) 10 MG capsule Take 10 mg by mouth at bedtime as needed for sleep.      No results found for this or any previous visit (from the past 48 hour(s)). No results found.  ROS  Blood pressure 135/80, pulse 85, temperature 97.8 F (36.6 C), temperature source Oral, resp. rate 17, height 5\' 5"  (1.651 m), weight 141 lb (64 kg), SpO2 95 %. Physical Exam  Constitutional: She appears well-developed and well-nourished.  HENT:  Mouth/Throat: Oropharynx is clear and moist.  Eyes: Conjunctivae are normal. No scleral icterus.  Neck: No thyromegaly present.  Cardiovascular: Normal rate, regular rhythm and normal heart sounds.  No murmur heard. Respiratory: Effort normal and breath sounds normal.  GI: Soft. She exhibits no distension and no mass. There is no tenderness.  Musculoskeletal: She exhibits no edema.  Lymphadenopathy:    She has no cervical adenopathy.  Neurological: She is alert.  Skin: Skin is warm and dry.     Assessment/Plan Heme positive stool. Diagnostic colonoscopy.  Hildred Laser, MD 10/30/2017, 12:15 PM

## 2017-10-30 NOTE — Discharge Instructions (Signed)
No aspirin or NSAIDs for 24 hours. Resume usual medications as before. High fiber diet. No driving for 24 hours. Physician will call with biopsy results.   Colonoscopy, Adult, Care After This sheet gives you information about how to care for yourself after your procedure. Your doctor may also give you more specific instructions. If you have problems or questions, call your doctor. Follow these instructions at home: General instructions   For the first 24 hours after the procedure: ? Do not drive or use machinery. ? Do not sign important documents. ? Do not drink alcohol. ? Do your daily activities more slowly than normal. ? Eat foods that are soft and easy to digest. ? Rest often.  Take over-the-counter or prescription medicines only as told by your doctor.  It is up to you to get the results of your procedure. Ask your doctor, or the department performing the procedure, when your results will be ready. To help cramping and bloating:  Try walking around.  Put heat on your belly (abdomen) as told by your doctor. Use a heat source that your doctor recommends, such as a moist heat pack or a heating pad. ? Put a towel between your skin and the heat source. ? Leave the heat on for 20-30 minutes. ? Remove the heat if your skin turns bright red. This is especially important if you cannot feel pain, heat, or cold. You can get burned. Eating and drinking  Drink enough fluid to keep your pee (urine) clear or pale yellow.  Return to your normal diet as told by your doctor. Avoid heavy or fried foods that are hard to digest.  Avoid drinking alcohol for as long as told by your doctor. Contact a doctor if:  You have blood in your poop (stool) 2-3 days after the procedure. Get help right away if:  You have more than a small amount of blood in your poop.  You see large clumps of tissue (blood clots) in your poop.  Your belly is swollen.  You feel sick to your stomach  (nauseous).  You throw up (vomit).  You have a fever.  You have belly pain that gets worse, and medicine does not help your pain. This information is not intended to replace advice given to you by your health care provider. Make sure you discuss any questions you have with your health care provider. Document Released: 10/13/2010 Document Revised: 06/04/2016 Document Reviewed: 06/04/2016 Elsevier Interactive Patient Education  2017 [employer].  Colon Polyps Polyps are tissue growths inside the body. Polyps can grow in many places, including the large intestine (colon). A polyp may be a round bump or a mushroom-shaped growth. You could have one polyp or several. Most colon polyps are noncancerous (benign). However, some colon polyps can become cancerous over time. What are the causes? The exact cause of colon polyps is not known. What increases the risk? This condition is more likely to develop in people who:  Have a family history of colon cancer or colon polyps.  Are older than 50 or older than 45 if they are African American.  Have inflammatory bowel disease, such as ulcerative colitis or Crohn disease.  Are overweight.  Smoke cigarettes.  Do not get enough exercise.  Drink too much alcohol.  Eat a diet that is: ? High in fat and red meat. ? Low in fiber.  Had childhood cancer that was treated with abdominal radiation.  What are the signs or symptoms? Most polyps do not cause  symptoms. If you have symptoms, they may include:  Blood coming from your rectum when having a bowel movement.  Blood in your stool.The stool may look dark red or black.  A change in bowel habits, such as constipation or diarrhea.  How is this diagnosed? This condition is diagnosed with a colonoscopy. This is a procedure that uses a lighted, flexible scope to look at the inside of your colon. How is this treated? Treatment for this condition involves removing any polyps that are found.  Those polyps will then be tested for cancer. If cancer is found, your health care provider will talk to you about options for colon cancer treatment. Follow these instructions at home: Diet  Eat plenty of fiber, such as fruits, vegetables, and whole grains.  Eat foods that are high in calcium and vitamin D, such as milk, cheese, yogurt, eggs, liver, fish, and broccoli.  Limit foods high in fat, red meats, and processed meats, such as hot dogs, sausage, bacon, and lunch meats.  Maintain a healthy weight, or lose weight if recommended by your health care provider. General instructions  Do not smoke cigarettes.  Do not drink alcohol excessively.  Keep all follow-up visits as told by your health care provider. This is important. This includes keeping regularly scheduled colonoscopies. Talk to your health care provider about when you need a colonoscopy.  Exercise every day or as told by your health care provider. Contact a health care provider if:  You have new or worsening bleeding during a bowel movement.  You have new or increased blood in your stool.  You have a change in bowel habits.  You unexpectedly lose weight. This information is not intended to replace advice given to you by your health care provider. Make sure you discuss any questions you have with your health care provider. Document Released: 06/06/2004 Document Revised: 02/16/2016 Document Reviewed: 08/01/2015 Elsevier Interactive Patient Education  2018 [employer].  Hemorrhoids Hemorrhoids are swollen veins in and around the rectum or anus. There are two types of hemorrhoids:  Internal hemorrhoids. These occur in the veins that are just inside the rectum. They may poke through to the outside and become irritated and painful.  External hemorrhoids. These occur in the veins that are outside of the anus and can be felt as a painful swelling or hard lump near the anus.  Most hemorrhoids do not cause serious problems,  and they can be managed with home treatments such as diet and lifestyle changes. If home treatments do not help your symptoms, procedures can be done to shrink or remove the hemorrhoids. What are the causes? This condition is caused by increased pressure in the anal area. This pressure may result from various things, including:  Constipation.  Straining to have a bowel movement.  Diarrhea.  Pregnancy.  Obesity.  Sitting for long periods of time.  Heavy lifting or other activity that causes you to strain.  Anal sex.  What are the signs or symptoms? Symptoms of this condition include:  Pain.  Anal itching or irritation.  Rectal bleeding.  Leakage of stool (feces).  Anal swelling.  One or more lumps around the anus.  How is this diagnosed? This condition can often be diagnosed through a visual exam. Other exams or tests may also be done, such as:  Examination of the rectal area with a gloved hand (digital rectal exam).  Examination of the anal canal using a small tube (anoscope).  A blood test, if you have lost  a significant amount of blood. °· A test to look inside the colon (sigmoidoscopy or colonoscopy). ° °How is this treated? °This condition can usually be treated at home. However, various procedures may be done if dietary changes, lifestyle changes, and other home treatments do not help your symptoms. These procedures can help make the hemorrhoids smaller or remove them completely. Some of these procedures involve surgery, and others do not. Common procedures include: °· Rubber band ligation. Rubber bands are placed at the base of the hemorrhoids to cut off the blood supply to them. °· Sclerotherapy. Medicine is injected into the hemorrhoids to shrink them. °· Infrared coagulation. A type of light energy is used to get rid of the hemorrhoids. °· Hemorrhoidectomy surgery. The hemorrhoids are surgically removed, and the veins that supply them are tied off. °· Stapled  hemorrhoidopexy surgery. A circular stapling device is used to remove the hemorrhoids and use staples to cut off the blood supply to them. ° °Follow these instructions at home: °Eating and drinking °· Eat foods that have a lot of fiber in them, such as whole grains, beans, nuts, fruits, and vegetables. Ask your health care provider about taking products that have added fiber (fiber supplements). °· Drink enough fluid to keep your urine clear or pale yellow. °Managing pain and swelling °· Take warm sitz baths for 20 minutes, 3-4 times a day to ease pain and discomfort. °· If directed, apply ice to the affected area. Using ice packs between sitz baths may be helpful. °? Put ice in a plastic bag. °? Place a towel between your skin and the bag. °? Leave the ice on for 20 minutes, 2-3 times a day. °General instructions °· Take over-the-counter and prescription medicines only as told by your health care provider. °· Use medicated creams or suppositories as told. °· Exercise regularly. °· Go to the bathroom when you have the urge to have a bowel movement. Do not wait. °· Avoid straining to have bowel movements. °· Keep the anal area dry and clean. Use wet toilet paper or moist towelettes after a bowel movement. °· Do not sit on the toilet for long periods of time. This increases blood pooling and pain. °Contact a health care provider if: °· You have increasing pain and swelling that are not controlled by treatment or medicine. °· You have uncontrolled bleeding. °· You have difficulty having a bowel movement, or you are unable to have a bowel movement. °· You have pain or inflammation outside the area of the hemorrhoids. °This information is not intended to replace advice given to you by your health care provider. Make sure you discuss any questions you have with your health care provider. °Document Released: 09/07/2000 Document Revised: 02/08/2016 Document Reviewed: 05/25/2015 °Elsevier Interactive Patient Education © 2018  [employer] ° °Diverticulosis °Diverticulosis is a condition that develops when small pouches (diverticula) form in the wall of the large intestine (colon). The colon is where water is absorbed and stool is formed. The pouches form when the inside layer of the colon pushes through weak spots in the outer layers of the colon. You may have a few pouches or many of them. °What are the causes? °The cause of this condition is not known. °What increases the risk? °The following factors may make you more likely to develop this condition: °· Being older than age 60. Your risk for this condition increases with age. Diverticulosis is rare among people younger than age 30. By age 80, many people have   it. °· Eating a low-fiber diet. °· Having frequent constipation. °· Being overweight. °· Not getting enough exercise. °· Smoking. °· Taking over-the-counter pain medicines, like aspirin and ibuprofen. °· Having a family history of diverticulosis. ° °What are the signs or symptoms? °In most people, there are no symptoms of this condition. If you do have symptoms, they may include: °· Bloating. °· Cramps in the abdomen. °· Constipation or diarrhea. °· Pain in the lower left side of the abdomen. ° °How is this diagnosed? °This condition is most often diagnosed during an exam for other colon problems. Because diverticulosis usually has no symptoms, it often cannot be diagnosed independently. This condition may be diagnosed by: °· Using a flexible scope to examine the colon (colonoscopy). °· Taking an X-ray of the colon after dye has been put into the colon (barium enema). °· Doing a CT scan. ° °How is this treated? °You may not need treatment for this condition if you have never developed an infection related to diverticulosis. If you have had an infection before, treatment may include: °· Eating a high-fiber diet. This may include eating more fruits, vegetables, and grains. °· Taking a fiber supplement. °· Taking a live bacteria  supplement (probiotic). °· Taking medicine to relax your colon. °· Taking antibiotic medicines. ° °Follow these instructions at home: °· Drink 6-8 glasses of water or more each day to prevent constipation. °· Try not to strain when you have a bowel movement. °· If you have had an infection before: °? Eat more fiber as directed by your health care provider or your diet and nutrition specialist (dietitian). °? Take a fiber supplement or probiotic, if your health care provider approves. °· Take over-the-counter and prescription medicines only as told by your health care provider. °· If you were prescribed an antibiotic, take it as told by your health care provider. Do not stop taking the antibiotic even if you start to feel better. °· Keep all follow-up visits as told by your health care provider. This is important. °Contact a health care provider if: °· You have pain in your abdomen. °· You have bloating. °· You have cramps. °· You have not had a bowel movement in 3 days. °Get help right away if: °· Your pain gets worse. °· Your bloating becomes very bad. °· You have a fever or chills, and your symptoms suddenly get worse. °· You vomit. °· You have bowel movements that are bloody or black. °· You have bleeding from your rectum. °Summary °· Diverticulosis is a condition that develops when small pouches (diverticula) form in the wall of the large intestine (colon). °· You may have a few pouches or many of them. °· This condition is most often diagnosed during an exam for other colon problems. °· If you have had an infection related to diverticulosis, treatment may include increasing the fiber in your diet, taking supplements, or taking medicines. °This information is not intended to replace advice given to you by your health care provider. Make sure you discuss any questions you have with your health care provider. °Document Released: 06/07/2004 Document Revised: 07/30/2016 Document Reviewed: 07/30/2016 °Elsevier  Interactive Patient Education © 2017 Elsevier Inc. ° °

## 2017-10-30 NOTE — Op Note (Signed)
[facility] Patient Name: Pamela Henson Procedure Date: 10/30/2017 12:05 PM MRN: [id number] Date of Birth: 06-02-1951 Attending MD: Hildred Laser , MD CSN: [id number] Age: 67 Admit Type: Outpatient Procedure:                Colonoscopy Indications:              Heme positive stool Providers:                Hildred Laser, MD, Janeece Riggers, RN, Nelma Rothman,                            Technician Referring MD:             Kirstie Peri. Fisher, MD Medicines:                Meperidine 50 mg IV, Midazolam 6 mg IV Complications:            No immediate complications. Estimated Blood Loss:     Estimated blood loss was minimal. Procedure:                Pre-Anesthesia Assessment:                           - Prior to the procedure, a History and Physical                            was performed, and patient medications and                            allergies were reviewed. The patient's tolerance of                            previous anesthesia was also reviewed. The risks                            and benefits of the procedure and the sedation                            options and risks were discussed with the patient.                            All questions were answered, and informed consent                            was obtained. Prior Anticoagulants: The patient has                            taken no previous anticoagulant or antiplatelet                            agents. ASA Grade Assessment: II - A patient with                            mild systemic disease. After reviewing the risks  and benefits, the patient was deemed in                            satisfactory condition to undergo the procedure.                           After obtaining informed consent, the colonoscope                            was passed under direct vision. Throughout the                            procedure, the patient's blood pressure, pulse, and                            oxygen  saturations were monitored continuously. The                            [id number] ([id number]) scope was introduced through                            the anus and advanced to the the cecum, identified                            by appendiceal orifice and ileocecal valve. The                            colonoscopy was somewhat difficult due to a                            tortuous colon. Successful completion of the                            procedure was aided by changing the patient to a                            supine position. The patient tolerated the                            procedure well. The quality of the bowel                            preparation was adequate. The ileocecal valve,                            appendiceal orifice, and rectum were photographed. Scope In: 12:26:11 PM Scope Out: 12:52:01 PM Scope Withdrawal Time: 0 hours 10 minutes 5 seconds  Total Procedure Duration: 0 hours 25 minutes 50 seconds  Findings:      The perianal and digital rectal examinations were normal.      Two sessile polyps were found in the hepatic flexure. The polyps were       small in size. These were biopsied with a cold forceps for histology.      Scattered medium-mouthed diverticula were found  in the sigmoid colon.      External hemorrhoids were found during retroflexion. The hemorrhoids       were small. Impression:               - Two small polyps at the hepatic flexure. Biopsied.                           - Diverticulosis in the sigmoid colon.                           - External hemorrhoids. Moderate Sedation:      Moderate (conscious) sedation was administered by the endoscopy nurse       and supervised by the endoscopist. The following parameters were       monitored: oxygen saturation, heart rate, blood pressure, CO2       capnography and response to care. Total physician intraservice time was       32 minutes. Recommendation:           - Patient has a contact number  available for                            emergencies. The signs and symptoms of potential                            delayed complications were discussed with the                            patient. Return to normal activities tomorrow.                            Written discharge instructions were provided to the                            patient.                           - High fiber diet today.                           - Continue present medications.                           - No aspirin, ibuprofen, naproxen, or other                            non-steroidal anti-inflammatory drugs for 1 day.                           - Await pathology results.                           - Repeat colonoscopy is recommended. The                            colonoscopy date will be determined after pathology  results from today's exam become available for                            review. Procedure Code(s):        --- Professional ---                           [phone], Colonoscopy, flexible; with biopsy, single                            or multiple                           99152, Moderate sedation services provided by the                            same physician or other qualified health care                            professional performing the diagnostic or                            therapeutic service that the sedation supports,                            requiring the presence of an independent trained                            observer to assist in the monitoring of the                            patient's level of consciousness and physiological                            status; initial 15 minutes of intraservice time,                            patient age 47 years or older                           [id number], Moderate sedation services; each additional                            15 minutes intraservice time Diagnosis Code(s):        --- Professional ---                            D12.3, Benign neoplasm of transverse colon (hepatic                            flexure or splenic flexure)                           K64.4, Residual hemorrhoidal skin tags  R19.5, Other fecal abnormalities                           K57.30, Diverticulosis of large intestine without                            perforation or abscess without bleeding CPT copyright 2016 American [employer] Association. All rights reserved. The codes documented in this report are preliminary and upon coder review may  be revised to meet current compliance requirements. Hildred Laser, MD Hildred Laser, MD 10/30/2017 1:02:10 PM This report has been signed electronically. Number of Addenda: 0

## 2017-10-31 ENCOUNTER — Encounter: Payer: Self-pay | Admitting: Family Medicine

## 2017-10-31 DIAGNOSIS — Z86010 Personal history of colonic polyps: Secondary | ICD-10-CM | POA: Insufficient documentation

## 2017-11-01 ENCOUNTER — Encounter (HOSPITAL_COMMUNITY): Payer: Self-pay | Admitting: Internal Medicine

## 2018-05-27 DIAGNOSIS — H5213 Myopia, bilateral: Secondary | ICD-10-CM | POA: Diagnosis not present

## 2018-07-02 DIAGNOSIS — R338 Other retention of urine: Secondary | ICD-10-CM | POA: Diagnosis not present

## 2018-07-02 DIAGNOSIS — R350 Frequency of micturition: Secondary | ICD-10-CM | POA: Diagnosis not present

## 2019-05-26 DIAGNOSIS — F410 Panic disorder [episodic paroxysmal anxiety] without agoraphobia: Secondary | ICD-10-CM | POA: Diagnosis not present

## 2019-05-26 DIAGNOSIS — F331 Major depressive disorder, recurrent, moderate: Secondary | ICD-10-CM | POA: Diagnosis not present

## 2019-05-26 DIAGNOSIS — F411 Generalized anxiety disorder: Secondary | ICD-10-CM | POA: Diagnosis not present

## 2019-07-07 DIAGNOSIS — R350 Frequency of micturition: Secondary | ICD-10-CM | POA: Diagnosis not present

## 2019-07-07 DIAGNOSIS — R338 Other retention of urine: Secondary | ICD-10-CM | POA: Diagnosis not present

## 2019-08-25 DIAGNOSIS — F331 Major depressive disorder, recurrent, moderate: Secondary | ICD-10-CM | POA: Diagnosis not present

## 2019-08-25 DIAGNOSIS — F411 Generalized anxiety disorder: Secondary | ICD-10-CM | POA: Diagnosis not present

## 2019-08-25 DIAGNOSIS — F410 Panic disorder [episodic paroxysmal anxiety] without agoraphobia: Secondary | ICD-10-CM | POA: Diagnosis not present

## 2020-01-07 ENCOUNTER — Telehealth: Payer: Self-pay | Admitting: Family Medicine

## 2020-01-07 DIAGNOSIS — F329 Major depressive disorder, single episode, unspecified: Secondary | ICD-10-CM

## 2020-01-07 DIAGNOSIS — F319 Bipolar disorder, unspecified: Secondary | ICD-10-CM

## 2020-01-07 NOTE — Telephone Encounter (Signed)
Patient is calling to request a referral to another psychiatrist.  Patient has hard time getting her medication refills from her current psychiatrist. Patient is open to a psychiatrist in [place], [place], or [place]. Please advise CB- [phone]

## 2020-01-12 NOTE — Telephone Encounter (Signed)
Patient calling to check status of this request.  

## 2020-01-13 NOTE — Telephone Encounter (Signed)
Please advise of ok to place psychiatry referral. If so do you have any preferences?

## 2020-01-13 NOTE — Telephone Encounter (Signed)
Referral was entered last week. Please check with sarah.

## 2020-01-13 NOTE — Telephone Encounter (Signed)
Patient called requesting an update on psychiatry referral. Please advise.

## 2020-01-14 NOTE — Telephone Encounter (Signed)
Pt advised it was sent to [facility] on 01/08/20 by Dr Caryn Section.I gave her their contact information and also sent their referral coordinator a message

## 2020-02-02 ENCOUNTER — Telehealth: Payer: Self-pay

## 2020-02-02 NOTE — Telephone Encounter (Signed)
Copied from [place] [id number]. Topic: Referral - Status >> Feb 02, 2020  2:20 PM Greggory Keen D wrote: Reason for CRM: Pt called saying she was referred to psychiatry in the middle of April from Dr, Caryn Section.   She called psy and left them a message and has not heard back from them.  This has been one month and today she called again and left another message.  She wants to know if there is anything Dr, Caryn Section can do to spend up her getting an appt or at least a phone call back from their office.  Pt's CB# [phone]

## 2020-02-03 NOTE — Telephone Encounter (Signed)
Pamela Henson, this patient was referred to psychiatry on 01/08/2020 but hasn't heard back from anyone for that office. Is there anyway we can call the psychiatrist to get an update for the patient?

## 2020-02-15 NOTE — Telephone Encounter (Signed)
Pt is scheduled for 03/30/20 and has spoken to office

## 2020-03-30 ENCOUNTER — Telehealth (HOSPITAL_COMMUNITY): Payer: Medicare Other | Admitting: Psychiatry

## 2020-07-13 DIAGNOSIS — H524 Presbyopia: Secondary | ICD-10-CM | POA: Diagnosis not present

## 2020-07-13 DIAGNOSIS — H5213 Myopia, bilateral: Secondary | ICD-10-CM | POA: Diagnosis not present

## 2020-07-20 DIAGNOSIS — R338 Other retention of urine: Secondary | ICD-10-CM | POA: Diagnosis not present

## 2020-07-20 DIAGNOSIS — R350 Frequency of micturition: Secondary | ICD-10-CM | POA: Diagnosis not present

## 2020-08-24 DIAGNOSIS — F331 Major depressive disorder, recurrent, moderate: Secondary | ICD-10-CM | POA: Diagnosis not present

## 2020-08-24 DIAGNOSIS — F411 Generalized anxiety disorder: Secondary | ICD-10-CM | POA: Diagnosis not present

## 2020-08-24 DIAGNOSIS — F410 Panic disorder [episodic paroxysmal anxiety] without agoraphobia: Secondary | ICD-10-CM | POA: Diagnosis not present

## 2021-03-15 DIAGNOSIS — F410 Panic disorder [episodic paroxysmal anxiety] without agoraphobia: Secondary | ICD-10-CM | POA: Diagnosis not present

## 2021-03-15 DIAGNOSIS — F331 Major depressive disorder, recurrent, moderate: Secondary | ICD-10-CM | POA: Diagnosis not present

## 2021-03-15 DIAGNOSIS — F411 Generalized anxiety disorder: Secondary | ICD-10-CM | POA: Diagnosis not present

## 2021-07-19 DIAGNOSIS — R350 Frequency of micturition: Secondary | ICD-10-CM | POA: Diagnosis not present

## 2021-07-19 DIAGNOSIS — R338 Other retention of urine: Secondary | ICD-10-CM | POA: Diagnosis not present

## 2021-09-26 DIAGNOSIS — F411 Generalized anxiety disorder: Secondary | ICD-10-CM | POA: Diagnosis not present

## 2021-09-26 DIAGNOSIS — F331 Major depressive disorder, recurrent, moderate: Secondary | ICD-10-CM | POA: Diagnosis not present

## 2021-09-26 DIAGNOSIS — F410 Panic disorder [episodic paroxysmal anxiety] without agoraphobia: Secondary | ICD-10-CM | POA: Diagnosis not present

## 2021-11-20 ENCOUNTER — Ambulatory Visit: Payer: Medicare Other | Admitting: Allergy & Immunology

## 2021-11-20 ENCOUNTER — Encounter: Payer: Self-pay | Admitting: Allergy & Immunology

## 2021-11-20 ENCOUNTER — Other Ambulatory Visit: Payer: Self-pay

## 2021-11-20 VITALS — BP 106/66 | HR 73 | Temp 97.7°F | Resp 16 | Ht 65.0 in | Wt 126.2 lb

## 2021-11-20 DIAGNOSIS — J310 Chronic rhinitis: Secondary | ICD-10-CM

## 2021-11-20 DIAGNOSIS — L299 Pruritus, unspecified: Secondary | ICD-10-CM

## 2021-11-20 DIAGNOSIS — J3089 Other allergic rhinitis: Secondary | ICD-10-CM

## 2021-11-20 NOTE — Patient Instructions (Addendum)
1. Chronic rhinitis - Testing today showed: indoor molds (which does not really fit your symptom pattern) - Copy of test results provided.  - Avoidance measures provided. - Continue with: Zyrtec (cetirizine) 10mg  tablet once daily - Start taking: Flonase (fluticasone) one spray per nostril TWICE daily (AIM FOR EAR ON EACH SIDE) and Pazeo one drop per eye daily as needed - You can use an extra dose of the antihistamine, if needed, for breakthrough symptoms.   2. Return in about 6 weeks (around 01/01/2022).    Please inform us of any Emergency Department visits, hospitalizations, or changes in symptoms. Call us before going to the ED for breathing or allergy symptoms since we might be able to fit you in for a sick visit. Feel free to contact us anytime with any questions, problems, or concerns.  It was a pleasure to meet you today!  Websites that have reliable patient information: 1. American Academy of Asthma, Allergy, and Immunology: www.aaaai.org 2. Food Allergy Research and Education (FARE): foodallergy.org 3. Mothers of Asthmatics: http://www.asthmacommunitynetwork.org 4. American College of Allergy, Asthma, and Immunology: www.acaai.org   COVID-19 Vaccine Information can be found at: ShippingScam.co.uk For questions related to vaccine distribution or appointments, please email [email or web address] or call [phone].   We realize that you might be concerned about having an allergic reaction to the COVID19 vaccines. To help with that concern, WE ARE OFFERING THE COVID19 VACCINES IN OUR OFFICE! Ask the front desk for dates!     Like [place] on [place] and Instagram for our latest updates!      A healthy democracy works best when [employer] participate! Make sure you are registered to vote! If you have moved or changed any of your contact information, you will need to get this updated before voting!  In some cases, you MAY  be able to register to vote online: [email or web address]         Airborne Adult Perc - 11/20/21 1152     Time Antigen Placed [place] Lavella Hammock    Location Back    Number of Test 59    1. Control-Buffer 50% Glycerol Negative    2. Control-Histamine 1 mg/ml 2+    3. Albumin saline Negative    4. [place] Negative    5. [place] Negative    6. Johnson Negative    7. Castle Blue Negative    8. Meadow Fescue Negative    9. Perennial Rye Negative    10. Sweet Vernal Negative    11. Timothy Negative    12. Cocklebur Negative    13. Burweed Marshelder Negative    14. Ragweed, short Negative    15. Ragweed, Giant Negative    16. Plantain,  English Negative    17. Lamb's Quarters Negative    18. Sheep Sorrell Negative    19. Rough Pigweed Negative    20. Marsh Elder, Rough Negative    21. Mugwort, Common Negative    22. Ash mix Negative    23. Birch mix Negative    24. Beech American Negative    25. Box, Elder Negative    26. Cedar, red Negative    27. Cottonwood, [place] Negative    28. Elm mix Negative    29. Hickory Negative    30. Maple mix Negative    31. Oak, [place] mix Negative    32. Pecan Pollen Negative    33. Pine mix Negative    34. Sycamore Eastern Negative  [place], Black Pollen Negative    36. Alternaria alternata Negative    37. Cladosporium Herbarum Negative    38. Aspergillus mix Negative    39. Penicillium mix Negative    40. Bipolaris sorokiniana (Helminthosporium) Negative    41. Drechslera spicifera (Curvularia) Negative    42. Mucor plumbeus Negative    43. Fusarium moniliforme Negative    44. Aureobasidium pullulans (pullulara) Negative    45. Rhizopus oryzae Negative    46. Botrytis cinera Negative    47. Epicoccum nigrum Negative    48. Phoma betae Negative    49. Candida Albicans Negative    50. Trichophyton mentagrophytes Negative    51. Mite, D Farinae  5,000 AU/ml Negative    52.  Mite, D Pteronyssinus  5,000 AU/ml Negative    53. Cat Hair 10,000 BAU/ml Negative    54.  Dog Epithelia Negative    55. Mixed Feathers Negative    56. Horse Epithelia Negative    57. Cockroach, German Negative    58. Mouse Negative    59. Tobacco Leaf Negative             Intradermal - 11/20/21 1220     Time Antigen Placed 1220    Allergen Manufacturer Lavella Hammock    Location Arm    Number of Test 15    Intradermal Select    Control Negative    [place] Negative    Johnson Negative    7 Grass Negative    Ragweed mix Negative    Weed mix Negative    Tree mix Negative    Mold 1 Negative    Mold 2 2+    Mold 3 Negative    Mold 4 Negative    Cat Negative    Dog Negative    Cockroach Negative    Mite mix Negative             Control of Mold Allergen   Mold and fungi can grow on a variety of surfaces provided certain temperature and moisture conditions exist.  Outdoor molds grow on plants, decaying vegetation and soil.  The major outdoor mold, Alternaria and Cladosporium, are found in very high numbers during hot and dry conditions.  Generally, a late Summer - Fall peak is seen for common outdoor fungal spores.  Rain will temporarily lower outdoor mold spore count, but counts rise rapidly when the rainy period ends.  The most important indoor molds are Aspergillus and Penicillium.  Dark, humid and poorly ventilated basements are ideal sites for mold growth.  The next most common sites of mold growth are the bathroom and the kitchen.  Indoor (Perennial) Mold Control   Positive indoor molds via skin testing: Aspergillus and Penicillium  Maintain humidity below 50%. Clean washable surfaces with 5% bleach solution. Remove sources e.g. contaminated carpets.

## 2021-11-20 NOTE — Progress Notes (Signed)
NEW PATIENT  Date of Service/Encounter:  11/20/21  Consult requested by: No primary care provider on file.   Assessment:   Chronic rhinitis - with testing only positive to indoor molds  Pruritus - consider labs at the next visit if symptoms continue  Plan/Recommendations:    1. Chronic rhinitis - Testing today showed: indoor molds (which does not really fit your symptom pattern) - Copy of test results provided.  - Avoidance measures provided. - Continue with: Zyrtec (cetirizine) 10mg  tablet once daily - Start taking: Flonase (fluticasone) one spray per nostril TWICE daily (AIM FOR EAR ON EACH SIDE) and Pazeo one drop per eye daily as needed - You can use an extra dose of the antihistamine, if needed, for breakthrough symptoms.   2. Return in about 6 weeks (around 01/01/2022).   This note in its entirety was forwarded to the Provider who requested this consultation.  Subjective:   Pamela Henson is a 71 y.o. female presenting today for evaluation of  Chief Complaint  Patient presents with   Allergic Rhinitis     Wants to know what she is allergic to.    Pamela Henson has a history of the following: Patient Active Problem List   Diagnosis Date Noted   Chronic rhinitis 11/20/2021   Pruritus 11/20/2021   History of adenomatous polyp of colon 10/31/2017   Guaiac + stool 10/10/2017   Occult blood positive stool 08/14/2017   Arthritis 10/11/2015   Clinical depression 10/11/2015   Degeneration of lumbar or lumbosacral intervertebral disc 01/16/2009   Bipolar affective disorder ([place]) 09/24/1998    History obtained from: chart review and patient.  Pamela Henson was referred by No primary care provider on file.Pamela Henson is a 71 y.o. female presenting for an evaluation of environmental allergies and itching .   Allergic Rhinitis Symptom History: She reports that she has itchy watery eyes when she goes outside. Symptoms started in the mid 2000s. Things have gotten worse  over the last year. Then she develops ear pain and teeth pain. She has never allergy tested. Right now she is using on Sudafed. She is unsure whether this is a coincidence, but when she takes Benadryl or Claritin, she gets a migraine.  She has tried Advil Cold and Sinus. She has tried Zyrtec which is the last thing that she bought, but she has not tried it since she had to come here. She uses Vicks Sinex very rarely and does not use it every day. She does not get sinus infections often at all. She cannot remember the last time that she got antibiotics. She gets these symptoms all year round.   Skin Symptom History: She also reports some itching especially on her head and arms. There is no rash.  This started within the last year.   Otherwise, there is no history of other atopic diseases, including asthma, food allergies, drug allergies, stinging insect allergies, or contact dermatitis. There is no significant infectious history. Vaccinations are up to date.    Past Medical History: Patient Active Problem List   Diagnosis Date Noted   Chronic rhinitis 11/20/2021   Pruritus 11/20/2021   History of adenomatous polyp of colon 10/31/2017   Guaiac + stool 10/10/2017   Occult blood positive stool 08/14/2017   Arthritis 10/11/2015   Clinical depression 10/11/2015   Degeneration of lumbar or lumbosacral intervertebral disc 01/16/2009   Bipolar affective disorder ([place]) 09/24/1998    Medication List:  Allergies as  of 11/20/2021   No Known Allergies      Medication List        Accurate as of November 20, 2021  1:15 PM. If you have any questions, ask your nurse or doctor.          STOP taking these medications    aspirin 325 MG EC tablet Stopped by: Valentina Shaggy, MD   ENLYTE PO Stopped by: Valentina Shaggy, MD   zaleplon 10 MG capsule Commonly known as: SONATA Stopped by: Valentina Shaggy, MD       TAKE these medications    gabapentin 100 MG capsule Commonly  known as: NEURONTIN Take 100 mg by mouth at bedtime.   ibuprofen 200 MG tablet Commonly known as: ADVIL Take 200 mg by mouth every 8 (eight) hours as needed for mild pain.   sertraline 25 MG tablet Commonly known as: ZOLOFT Take 12.5 mg by mouth at bedtime.   tamsulosin 0.4 MG Caps capsule Commonly known as: FLOMAX Take 1 capsule (0.4 mg total) by mouth daily. What changed: when to take this        Birth History: non-contributory  Developmental History: non-contributory  Past Surgical History: Past Surgical History:  Procedure Laterality Date   ABDOMINAL HYSTERECTOMY  1979   age 44 due to vaginal bleeding   COLONOSCOPY N/A 10/30/2017   Procedure: COLONOSCOPY;  Surgeon: Rogene Houston, MD;  Location: [facility];  Service: Endoscopy;  Laterality: N/A;  3     Family History: Family History  Problem Relation Age of Onset   Stroke Mother 75   Stroke Father 36   Colon cancer Neg Hx      Social History: Raeley lives at home with her family.  She lives in a house that is 69 years old.  There is hardwoods in the main living areas in the bedrooms.  They have gas heating and central cooling.  There are no animals inside or outside of the home.  There are no dust mite covers on the bedding.  There is tobacco exposure both inside and outside of the home.  She has smoked a pack per day since 2000.  She is currently retired.  She retired from [employer] in [place]. 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 She did not kill the animals herself but only procured them. 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    Review of Systems  Constitutional: Negative.  Negative for chills, fever, malaise/fatigue and weight loss.  HENT:  Positive for congestion. Negative for ear discharge and ear pain.   Eyes:  Negative for pain, discharge and redness.  Respiratory:  Negative for cough, sputum  production, shortness of breath and wheezing.   Cardiovascular: Negative.  Negative for chest pain and palpitations.  Gastrointestinal:  Negative for abdominal pain, constipation, diarrhea, heartburn, nausea and vomiting.  Skin:  Positive for itching and rash.  Neurological:  Negative for dizziness and headaches.  Endo/Heme/Allergies:  Negative for environmental allergies. Does not bruise/bleed easily.      Objective:   Blood pressure 106/66, pulse 73, temperature 97.7 F (36.5 C), temperature source Temporal, resp. rate 16, height 5\' 5"  (1.651 m), weight 126 lb 3.2 oz (57.2 kg), SpO2 94 %. Body mass index is 21 kg/m.     Physical Exam Vitals reviewed.  Constitutional:      Appearance: She is well-developed.  HENT:  Head: Normocephalic and atraumatic.     Right Ear: Tympanic membrane, ear canal and external ear normal. No drainage, swelling or tenderness. Tympanic membrane is not injected, scarred, erythematous, retracted or bulging.     Left Ear: Tympanic membrane, ear canal and external ear normal. No drainage, swelling or tenderness. Tympanic membrane is not injected, scarred, erythematous, retracted or bulging.     Nose: No nasal deformity, septal deviation, mucosal edema or rhinorrhea.     Right Turbinates: Enlarged.     Left Turbinates: Enlarged.     Right Sinus: No maxillary sinus tenderness or frontal sinus tenderness.     Left Sinus: No maxillary sinus tenderness or frontal sinus tenderness.     Comments: Turbinates are erythematous.     Mouth/Throat:     Lips: Pink.     Mouth: Mucous membranes are moist. Mucous membranes are not pale and not dry.     Pharynx: Uvula midline.     Comments: Cobblestoning in the posterior oropharynx.  Eyes:     General:        Right eye: No discharge.        Left eye: No discharge.     Conjunctiva/sclera: Conjunctivae normal.     Right eye: Right conjunctiva is not injected. No chemosis.    Left eye: Left conjunctiva is not  injected. No chemosis.    Pupils: Pupils are equal, round, and reactive to light.  Cardiovascular:     Rate and Rhythm: Normal rate and regular rhythm.     Heart sounds: Normal heart sounds.  Pulmonary:     Effort: Pulmonary effort is normal. No tachypnea, accessory muscle usage or respiratory distress.     Breath sounds: Normal breath sounds. No wheezing, rhonchi or rales.     Comments: Moving air well in all lung fields.  Chest:     Chest wall: No tenderness.  Abdominal:     Tenderness: There is no abdominal tenderness. There is no guarding or rebound.  Lymphadenopathy:     Head:     Right side of head: No submandibular, tonsillar or occipital adenopathy.     Left side of head: No submandibular, tonsillar or occipital adenopathy.     Cervical: No cervical adenopathy.  Skin:    General: Skin is warm.     Capillary Refill: Capillary refill takes less than 2 seconds.     Coloration: Skin is not pale.     Findings: No abrasion, erythema, petechiae or rash. Rash is not papular, urticarial or vesicular.     Comments: No eczematous or urticarial lesions noted.   Neurological:     Mental Status: She is alert.     Diagnostic studies:   Allergy Studies:     Airborne Adult Perc - 11/20/21 1152     Time Antigen Placed 1152    Allergen Manufacturer Lavella Hammock    Location Back    Number of Test 59    1. Control-Buffer 50% Glycerol Negative    2. Control-Histamine 1 mg/ml 2+    3. Albumin saline Negative    4. [place] Negative    5. [place] Negative    6. Johnson Negative    7. [place]viot Blue Negative    8. Meadow Fescue Negative    9. Perennial Rye Negative    10. Sweet Vernal Negative    11. Timothy Negative    12. Cocklebur Negative    13. Burweed Marshelder Negative    14. Ragweed, short Negative  15. Ragweed, Giant Negative    16. Plantain,  English Negative    17. Lamb's Quarters Negative    18. Sheep Sorrell Negative    19. Rough Pigweed Negative    20. Marsh Elder, Rough  Negative    21. Mugwort, Common Negative    22. Ash mix Negative    23. Birch mix Negative    24. Beech American Negative    25. Box, Elder Negative    26. Cedar, red Negative    27. Cottonwood, [place] Negative    28. Elm mix Negative    29. Hickory Negative    30. Maple mix Negative    31. Oak, [place] mix Negative    32. Pecan Pollen Negative    33. Pine mix Negative    34. Sycamore Eastern Negative    35. Roff, Black Pollen Negative    36. Alternaria alternata Negative    37. Cladosporium Herbarum Negative    38. Aspergillus mix Negative    39. Penicillium mix Negative    40. Bipolaris sorokiniana (Helminthosporium) Negative    41. Drechslera spicifera (Curvularia) Negative    42. Mucor plumbeus Negative    43. Fusarium moniliforme Negative    44. Aureobasidium pullulans (pullulara) Negative    45. Rhizopus oryzae Negative    46. Botrytis cinera Negative    47. Epicoccum nigrum Negative    48. Phoma betae Negative    49. Candida Albicans Negative    50. Trichophyton mentagrophytes Negative    51. Mite, D Farinae  5,000 AU/ml Negative    52. Mite, D Pteronyssinus  5,000 AU/ml Negative    53. Cat Hair 10,000 BAU/ml Negative    54.  Dog Epithelia Negative    55. Mixed Feathers Negative    56. Horse Epithelia Negative    57. Cockroach, German Negative    58. Mouse Negative    59. Tobacco Leaf Negative             Intradermal - 11/20/21 1220     Time Antigen Placed 1220    Allergen Manufacturer Lavella Hammock    Location Arm    Number of Test 15    Intradermal Select    Control Negative    [place] Negative    [place] Negative    7 Grass Negative    Ragweed mix Negative    Weed mix Negative    Tree mix Negative    Mold 1 Negative    Mold 2 2+    Mold 3 Negative    Mold 4 Negative    Cat Negative    Dog Negative    Cockroach Negative    Mite mix Negative             Allergy testing results were read and interpreted by myself, documented by clinical  staff.         Salvatore Marvel, MD [facility] [place] [facility]

## 2021-12-05 DIAGNOSIS — F410 Panic disorder [episodic paroxysmal anxiety] without agoraphobia: Secondary | ICD-10-CM | POA: Diagnosis not present

## 2021-12-05 DIAGNOSIS — F411 Generalized anxiety disorder: Secondary | ICD-10-CM | POA: Diagnosis not present

## 2021-12-05 DIAGNOSIS — F331 Major depressive disorder, recurrent, moderate: Secondary | ICD-10-CM | POA: Diagnosis not present

## 2022-01-01 ENCOUNTER — Encounter: Payer: Self-pay | Admitting: Family Medicine

## 2022-01-01 ENCOUNTER — Ambulatory Visit: Payer: Medicare Other | Admitting: Family Medicine

## 2022-01-01 VITALS — BP 88/62 | HR 100 | Temp 99.4°F | Resp 16 | Ht 65.0 in | Wt 126.6 lb

## 2022-01-01 DIAGNOSIS — L299 Pruritus, unspecified: Secondary | ICD-10-CM

## 2022-01-01 DIAGNOSIS — H1013 Acute atopic conjunctivitis, bilateral: Secondary | ICD-10-CM | POA: Insufficient documentation

## 2022-01-01 DIAGNOSIS — J3089 Other allergic rhinitis: Secondary | ICD-10-CM | POA: Diagnosis not present

## 2022-01-01 NOTE — Progress Notes (Signed)
? ?[address], [place] [address] ?Dept: [phone] ? ?FOLLOW UP NOTE ? ?Patient ID: Pamela Henson, female    DOB: [birth year]  Age: 71 y.o. MRN: [id number] ?Date of Office Visit: 01/01/2022 ? ?Assessment  ?Chief Complaint: Allergic Rhinitis  (Says she is ok. Still has watery eyes. ) ? ?HPI ?Pamela Henson is a 71 year old female who presents to the clinic for follow-up visit.  She was last seen in this clinic on 11/20/2021 by Dr. Ernst Bowler for evaluation of allergic rhinitis, allergic conjunctivitis, and chronic pruritus.  At today's visit she reports her allergic rhinitis has been much more well controlled than at her last visit.  She reports occasional symptoms including nasal congestion and postnasal drainage.  She continues cetirizine daily and Flonase daily.  She has used a nasal saline rinse in the past, however, is not currently using a nasal saline rinse.  Her last environmental allergy skin testing was on 11/22/2021 and was positive to mold mix 2.  She reports allergic conjunctivitis has been moderately well controlled with occasional clear drainage from her left eye.  She reports that if she goes outside she begins to feel pressure in her right ear and right eye which resolves when she is inside.  She is not currently using an allergy eyedrop.  Pruritus is reported as well controlled with cetirizine 10 mg once a day.  Her current medications are listed in the chart. ? ? ?Drug Allergies:  ?No Known Allergies ? ?Physical Exam: ?BP (!) 88/62   Pulse 100   Temp 99.4 ?F (37.4 ?C) (Temporal)   Resp 16   Ht '5\' 5"'$  (1.651 m)   Wt 126 lb 9.6 oz (57.4 kg)   SpO2 96%   BMI 21.07 kg/m?   ? ?Physical Exam ?Vitals reviewed.  ?Constitutional:   ?   Appearance: Normal appearance.  ?HENT:  ?   Head: Normocephalic and atraumatic.  ?   Right Ear: Tympanic membrane normal.  ?   Left Ear: Tympanic membrane normal.  ?   Nose:  ?   Comments: Bilateral nares slightly erythematous with clear nasal  drainage noted.  Pharynx normal.  Ears normal.  Eyes normal. ?   Mouth/Throat:  ?   Pharynx: Oropharynx is clear.  ?Eyes:  ?   Conjunctiva/sclera: Conjunctivae normal.  ?Cardiovascular:  ?   Rate and Rhythm: Normal rate and regular rhythm.  ?   Heart sounds: Normal heart sounds. No murmur heard. ?Pulmonary:  ?   Effort: Pulmonary effort is normal.  ?   Breath sounds: Normal breath sounds.  ?   Comments: Lungs clear to auscultation ?Musculoskeletal:     ?   General: Normal range of motion.  ?   Cervical back: Normal range of motion and neck supple.  ?Skin: ?   General: Skin is warm and dry.  ?Neurological:  ?   Mental Status: She is alert and oriented to person, place, and time.  ?Psychiatric:     ?   Mood and Affect: Mood normal.     ?   Behavior: Behavior normal.     ?   Thought Content: Thought content normal.     ?   Judgment: Judgment normal.  ? ? ?Assessment and Plan: ?1. Perennial allergic rhinitis   ?2. Pruritus   ?3. Perennial allergic conjunctivitis of both eyes   ? ? ?No orders of the defined types were placed in this encounter. ? ? ?Patient Instructions  ?Allergic rhinitis ?Continue allergen avoidance  measures directed toward mold as listed below ?Continue cetirizine 10 mg once a day as needed for runny nose or itch. Remember to rotate to a different antihistamine about every 3 months. Some examples of over the counter antihistamines include Zyrtec (cetirizine), Xyzal (levocetirizine), Allegra (fexofenadine), and Claritin (loratidine).  ?Continue Flonase 1 to 2 sprays in each nostril once a day as needed for stuffy nose ?Consider saline nasal rinses as needed for nasal symptoms. Use this before any medicated nasal sprays for best result ? ?Allergic conjunctivitis ?Continue olopatadine 1 drop in each eye once a day as needed for red or itchy eyes ?Continue a saline ocular rinse or lubricating eyedrop.  Use this prior to your allergy eyedrop ?Continue to wash your eyelashes after you come in from  outside ? ?Pruritus ?Continue a daily moisturizing routine ?Continue cetirizine 10 mg once a day as needed for itch.  You may take an additional cetirizine 10 mg once a day as needed for breakthrough symptoms. ? ?Call the clinic if this treatment plan is not working well for you. ? ?Follow up in 5 months or sooner if needed. ? ?Control of Mold Allergen Positive indoor molds via skin testing: Aspergillus and Penicillium ?Mold and fungi can grow on a variety of surfaces provided certain temperature and moisture conditions exist.  Outdoor molds grow on plants, decaying vegetation and soil.  The major outdoor mold, Alternaria and Cladosporium, are found in very high numbers during hot and dry conditions.  Generally, a late Summer - Fall peak is seen for common outdoor fungal spores.  Rain will temporarily lower outdoor mold spore count, but counts rise rapidly when the rainy period ends.  The most important indoor molds are Aspergillus and Penicillium.  Dark, humid and poorly ventilated basements are ideal sites for mold growth.  The next most common sites of mold growth are the bathroom and the kitchen. ? ?Outdoor [employer] ?Use air conditioning and keep windows closed ?Avoid exposure to decaying vegetation. ?Avoid leaf raking. ?Avoid grain handling. ?Consider wearing a face mask if working in moldy areas. ? ?Indoor Mold Control ?Maintain humidity below 50%. ?Clean washable surfaces with 5% bleach solution. ?Remove sources e.g. Contaminated carpets. ? ? ? ? ? ? ? ? ? ? ? ? ? ? ? ?No follow-ups on file. ?  ? ?Thank you for the opportunity to care for this patient.  Please do not hesitate to contact me with questions. ? ?Gareth Morgan, FNP ?[facility] ? ? ? ? ? ?

## 2022-01-01 NOTE — Patient Instructions (Signed)
Allergic rhinitis ?Continue allergen avoidance measures directed toward mold as listed below ?Continue cetirizine 10 mg once a day as needed for runny nose or itch. Remember to rotate to a different antihistamine about every 3 months. Some examples of over the counter antihistamines include Zyrtec (cetirizine), Xyzal (levocetirizine), Allegra (fexofenadine), and Claritin (loratidine).  ?Continue Flonase 1 to 2 sprays in each nostril once a day as needed for stuffy nose ?Consider saline nasal rinses as needed for nasal symptoms. Use this before any medicated nasal sprays for best result ? ?Allergic conjunctivitis ?Continue olopatadine 1 drop in each eye once a day as needed for red or itchy eyes ?Continue a saline ocular rinse or lubricating eyedrop.  Use this prior to your allergy eyedrop ?Continue to wash your eyelashes after you come in from outside ? ?Pruritus ?Continue a daily moisturizing routine ?Continue cetirizine 10 mg once a day as needed for itch.  You may take an additional cetirizine 10 mg once a day as needed for breakthrough symptoms. ? ?Call the clinic if this treatment plan is not working well for you. ? ?Follow up in 5 months or sooner if needed. ? ?Control of Mold Allergen Positive indoor molds via skin testing: Aspergillus and Penicillium ?Mold and fungi can grow on a variety of surfaces provided certain temperature and moisture conditions exist.  Outdoor molds grow on plants, decaying vegetation and soil.  The major outdoor mold, Alternaria and Cladosporium, are found in very high numbers during hot and dry conditions.  Generally, a late Summer - Fall peak is seen for common outdoor fungal spores.  Rain will temporarily lower outdoor mold spore count, but counts rise rapidly when the rainy period ends.  The most important indoor molds are Aspergillus and Penicillium.  Dark, humid and poorly ventilated basements are ideal sites for mold growth.  The next most common sites of mold growth are the  bathroom and the kitchen. ? ?Outdoor [employer] ?Use air conditioning and keep windows closed ?Avoid exposure to decaying vegetation. ?Avoid leaf raking. ?Avoid grain handling. ?Consider wearing a face mask if working in moldy areas. ? ?Indoor Mold Control ?Maintain humidity below 50%. ?Clean washable surfaces with 5% bleach solution. ?Remove sources e.g. Contaminated carpets. ? ? ? ? ? ? ? ? ? ? ? ? ? ? ?

## 2022-03-06 DIAGNOSIS — F411 Generalized anxiety disorder: Secondary | ICD-10-CM | POA: Diagnosis not present

## 2022-03-06 DIAGNOSIS — F410 Panic disorder [episodic paroxysmal anxiety] without agoraphobia: Secondary | ICD-10-CM | POA: Diagnosis not present

## 2022-03-06 DIAGNOSIS — F331 Major depressive disorder, recurrent, moderate: Secondary | ICD-10-CM | POA: Diagnosis not present

## 2022-04-17 DIAGNOSIS — H5213 Myopia, bilateral: Secondary | ICD-10-CM | POA: Diagnosis not present

## 2022-04-18 DIAGNOSIS — H524 Presbyopia: Secondary | ICD-10-CM | POA: Diagnosis not present

## 2022-05-18 ENCOUNTER — Ambulatory Visit: Payer: Medicare Other | Admitting: Family Medicine

## 2022-06-12 DIAGNOSIS — F411 Generalized anxiety disorder: Secondary | ICD-10-CM | POA: Diagnosis not present

## 2022-06-12 DIAGNOSIS — F410 Panic disorder [episodic paroxysmal anxiety] without agoraphobia: Secondary | ICD-10-CM | POA: Diagnosis not present

## 2022-06-12 DIAGNOSIS — F331 Major depressive disorder, recurrent, moderate: Secondary | ICD-10-CM | POA: Diagnosis not present

## 2022-07-19 DIAGNOSIS — R338 Other retention of urine: Secondary | ICD-10-CM | POA: Diagnosis not present

## 2022-07-19 DIAGNOSIS — R350 Frequency of micturition: Secondary | ICD-10-CM | POA: Diagnosis not present

## 2022-09-11 DIAGNOSIS — F331 Major depressive disorder, recurrent, moderate: Secondary | ICD-10-CM | POA: Diagnosis not present

## 2022-09-11 DIAGNOSIS — F410 Panic disorder [episodic paroxysmal anxiety] without agoraphobia: Secondary | ICD-10-CM | POA: Diagnosis not present

## 2022-09-11 DIAGNOSIS — F411 Generalized anxiety disorder: Secondary | ICD-10-CM | POA: Diagnosis not present

## 2022-10-12 ENCOUNTER — Other Ambulatory Visit: Payer: Self-pay

## 2022-10-12 ENCOUNTER — Emergency Department (HOSPITAL_COMMUNITY): Payer: Medicare Other

## 2022-10-12 ENCOUNTER — Encounter (HOSPITAL_COMMUNITY): Payer: Self-pay

## 2022-10-12 ENCOUNTER — Inpatient Hospital Stay (HOSPITAL_COMMUNITY)
Admission: EM | Admit: 2022-10-12 | Discharge: 2022-10-14 | DRG: 378 | Disposition: A | Discharge status: Home / Self Care | Payer: Medicare Other | Attending: Internal Medicine | Admitting: Internal Medicine

## 2022-10-12 DIAGNOSIS — F17200 Nicotine dependence, unspecified, uncomplicated: Secondary | ICD-10-CM | POA: Insufficient documentation

## 2022-10-12 DIAGNOSIS — Z90710 Acquired absence of both cervix and uterus: Secondary | ICD-10-CM

## 2022-10-12 DIAGNOSIS — K5791 Diverticulosis of intestine, part unspecified, without perforation or abscess with bleeding: Secondary | ICD-10-CM | POA: Diagnosis not present

## 2022-10-12 DIAGNOSIS — K5900 Constipation, unspecified: Secondary | ICD-10-CM

## 2022-10-12 DIAGNOSIS — K922 Gastrointestinal hemorrhage, unspecified: Secondary | ICD-10-CM | POA: Diagnosis present

## 2022-10-12 DIAGNOSIS — F17210 Nicotine dependence, cigarettes, uncomplicated: Secondary | ICD-10-CM | POA: Diagnosis present

## 2022-10-12 DIAGNOSIS — J449 Chronic obstructive pulmonary disease, unspecified: Secondary | ICD-10-CM | POA: Diagnosis present

## 2022-10-12 DIAGNOSIS — Z86010 Personal history of colonic polyps: Secondary | ICD-10-CM

## 2022-10-12 DIAGNOSIS — R0902 Hypoxemia: Secondary | ICD-10-CM

## 2022-10-12 DIAGNOSIS — D124 Benign neoplasm of descending colon: Secondary | ICD-10-CM | POA: Diagnosis not present

## 2022-10-12 DIAGNOSIS — Z66 Do not resuscitate: Secondary | ICD-10-CM | POA: Diagnosis present

## 2022-10-12 DIAGNOSIS — F32A Depression, unspecified: Secondary | ICD-10-CM | POA: Diagnosis present

## 2022-10-12 DIAGNOSIS — K219 Gastro-esophageal reflux disease without esophagitis: Secondary | ICD-10-CM | POA: Diagnosis not present

## 2022-10-12 DIAGNOSIS — D120 Benign neoplasm of cecum: Secondary | ICD-10-CM | POA: Diagnosis not present

## 2022-10-12 DIAGNOSIS — D62 Acute posthemorrhagic anemia: Secondary | ICD-10-CM | POA: Diagnosis not present

## 2022-10-12 DIAGNOSIS — K5731 Diverticulosis of large intestine without perforation or abscess with bleeding: Principal | ICD-10-CM | POA: Diagnosis present

## 2022-10-12 DIAGNOSIS — F319 Bipolar disorder, unspecified: Secondary | ICD-10-CM | POA: Diagnosis not present

## 2022-10-12 DIAGNOSIS — K635 Polyp of colon: Secondary | ICD-10-CM | POA: Diagnosis not present

## 2022-10-12 DIAGNOSIS — D649 Anemia, unspecified: Secondary | ICD-10-CM | POA: Diagnosis not present

## 2022-10-12 DIAGNOSIS — K5730 Diverticulosis of large intestine without perforation or abscess without bleeding: Secondary | ICD-10-CM | POA: Diagnosis not present

## 2022-10-12 DIAGNOSIS — Z8619 Personal history of other infectious and parasitic diseases: Secondary | ICD-10-CM

## 2022-10-12 DIAGNOSIS — K625 Hemorrhage of anus and rectum: Secondary | ICD-10-CM | POA: Diagnosis not present

## 2022-10-12 DIAGNOSIS — Z79899 Other long term (current) drug therapy: Secondary | ICD-10-CM | POA: Diagnosis not present

## 2022-10-12 LAB — CBC
HCT: 41.5 % (ref 36.0–46.0)
HCT: 37.1 % (ref 36.0–46.0)
HCT: 36.6 % (ref 36.0–46.0)
HCT: 36.9 % (ref 36.0–46.0)
HCT: 35.7 % — ABNORMAL LOW (ref 36.0–46.0)
HCT: 32 % — ABNORMAL LOW (ref 36.0–46.0)
Hemoglobin: 13.2 g/dL (ref 12.0–15.0)
Hemoglobin: 11.8 g/dL — ABNORMAL LOW (ref 12.0–15.0)
Hemoglobin: 11.4 g/dL — ABNORMAL LOW (ref 12.0–15.0)
Hemoglobin: 11.7 g/dL — ABNORMAL LOW (ref 12.0–15.0)
Hemoglobin: 11.3 g/dL — ABNORMAL LOW (ref 12.0–15.0)
Hemoglobin: 10 g/dL — ABNORMAL LOW (ref 12.0–15.0)
MCH: 29.1 pg (ref 26.0–34.0)
MCH: 29.1 pg (ref 26.0–34.0)
MCH: 28.9 pg (ref 26.0–34.0)
MCH: 29.3 pg (ref 26.0–34.0)
MCH: 29.5 pg (ref 26.0–34.0)
MCH: 29.3 pg (ref 26.0–34.0)
MCHC: 31.8 g/dL (ref 30.0–36.0)
MCHC: 31.8 g/dL (ref 30.0–36.0)
MCHC: 31.1 g/dL (ref 30.0–36.0)
MCHC: 31.7 g/dL (ref 30.0–36.0)
MCHC: 31.7 g/dL (ref 30.0–36.0)
MCHC: 31.3 g/dL (ref 30.0–36.0)
MCV: 91.6 fL (ref 80.0–100.0)
MCV: 91.4 fL (ref 80.0–100.0)
MCV: 92.7 fL (ref 80.0–100.0)
MCV: 92.3 fL (ref 80.0–100.0)
MCV: 93.2 fL (ref 80.0–100.0)
MCV: 93.8 fL (ref 80.0–100.0)
Platelets: 294 10*3/uL (ref 150–400)
Platelets: 273 10*3/uL (ref 150–400)
Platelets: 266 10*3/uL (ref 150–400)
Platelets: 302 10*3/uL (ref 150–400)
Platelets: 278 10*3/uL (ref 150–400)
Platelets: 245 10*3/uL (ref 150–400)
RBC: 4.53 MIL/uL (ref 3.87–5.11)
RBC: 4.06 MIL/uL (ref 3.87–5.11)
RBC: 3.95 MIL/uL (ref 3.87–5.11)
RBC: 4 MIL/uL (ref 3.87–5.11)
RBC: 3.83 MIL/uL — ABNORMAL LOW (ref 3.87–5.11)
RBC: 3.41 MIL/uL — ABNORMAL LOW (ref 3.87–5.11)
RDW: 13.1 % (ref 11.5–15.5)
RDW: 13 % (ref 11.5–15.5)
RDW: 13.1 % (ref 11.5–15.5)
RDW: 13.1 % (ref 11.5–15.5)
RDW: 13.2 % (ref 11.5–15.5)
RDW: 13.2 % (ref 11.5–15.5)
WBC: 12 10*3/uL — ABNORMAL HIGH (ref 4.0–10.5)
WBC: 10.1 10*3/uL (ref 4.0–10.5)
WBC: 10.8 10*3/uL — ABNORMAL HIGH (ref 4.0–10.5)
WBC: 12.7 10*3/uL — ABNORMAL HIGH (ref 4.0–10.5)
WBC: 10.6 10*3/uL — ABNORMAL HIGH (ref 4.0–10.5)
WBC: 10.2 10*3/uL (ref 4.0–10.5)
nRBC: 0 % (ref 0.0–0.2)
nRBC: 0 % (ref 0.0–0.2)
nRBC: 0 % (ref 0.0–0.2)
nRBC: 0 % (ref 0.0–0.2)
nRBC: 0 % (ref 0.0–0.2)
nRBC: 0 % (ref 0.0–0.2)

## 2022-10-12 LAB — CBC WITH DIFFERENTIAL/PLATELET
Abs Immature Granulocytes: 0.02 10*3/uL (ref 0.00–0.07)
Basophils Absolute: 0.1 10*3/uL (ref 0.0–0.1)
Basophils Relative: 1 %
Eosinophils Absolute: 0.2 10*3/uL (ref 0.0–0.5)
Eosinophils Relative: 3 %
HCT: 43.5 % (ref 36.0–46.0)
Hemoglobin: 14.7 g/dL (ref 12.0–15.0)
Immature Granulocytes: 0 %
Lymphocytes Relative: 44 %
Lymphs Abs: 4.3 10*3/uL — ABNORMAL HIGH (ref 0.7–4.0)
MCH: 30.9 pg (ref 26.0–34.0)
MCHC: 33.8 g/dL (ref 30.0–36.0)
MCV: 91.4 fL (ref 80.0–100.0)
Monocytes Absolute: 0.9 10*3/uL (ref 0.1–1.0)
Monocytes Relative: 9 %
Neutro Abs: 4.2 10*3/uL (ref 1.7–7.7)
Neutrophils Relative %: 43 %
Platelets: 341 10*3/uL (ref 150–400)
RBC: 4.76 MIL/uL (ref 3.87–5.11)
RDW: 13 % (ref 11.5–15.5)
WBC: 9.7 10*3/uL (ref 4.0–10.5)
nRBC: 0 % (ref 0.0–0.2)

## 2022-10-12 LAB — TYPE AND SCREEN
ABO/RH(D): O POS
Antibody Screen: NEGATIVE

## 2022-10-12 LAB — BASIC METABOLIC PANEL
Anion gap: 10 (ref 5–15)
BUN: 20 mg/dL (ref 8–23)
CO2: 22 mmol/L (ref 22–32)
Calcium: 8.9 mg/dL (ref 8.9–10.3)
Chloride: 106 mmol/L (ref 98–111)
Creatinine, Ser: 0.62 mg/dL (ref 0.44–1.00)
GFR, Estimated: >60 mL/min (ref >60)
Glucose, Bld: 117 mg/dL — ABNORMAL HIGH (ref 70–99)
Potassium: 3.7 mmol/L (ref 3.5–5.1)
Sodium: 138 mmol/L (ref 135–145)

## 2022-10-12 LAB — COMPREHENSIVE METABOLIC PANEL
ALT: 12 U/L (ref 0–44)
AST: 22 U/L (ref 15–41)
Albumin: 3.2 g/dL — ABNORMAL LOW (ref 3.5–5.0)
Alkaline Phosphatase: 92 U/L (ref 38–126)
Anion gap: 6 (ref 5–15)
BUN: 19 mg/dL (ref 8–23)
CO2: 25 mmol/L (ref 22–32)
Calcium: 8.5 mg/dL — ABNORMAL LOW (ref 8.9–10.3)
Chloride: 108 mmol/L (ref 98–111)
Creatinine, Ser: 0.51 mg/dL (ref 0.44–1.00)
GFR, Estimated: >60 mL/min (ref >60)
Glucose, Bld: 127 mg/dL — ABNORMAL HIGH (ref 70–99)
Potassium: 3.8 mmol/L (ref 3.5–5.1)
Sodium: 139 mmol/L (ref 135–145)
Total Bilirubin: 0.6 mg/dL (ref 0.3–1.2)
Total Protein: 6.2 g/dL — ABNORMAL LOW (ref 6.5–8.1)

## 2022-10-12 LAB — MAGNESIUM: Magnesium: 2.1 mg/dL (ref 1.7–2.4)

## 2022-10-12 LAB — PROTIME-INR
INR: 0.9 (ref 0.8–1.2)
Prothrombin Time: 12.1 seconds (ref 11.4–15.2)

## 2022-10-12 LAB — HIV ANTIBODY (ROUTINE TESTING W REFLEX): HIV Screen 4th Generation wRfx: NONREACTIVE

## 2022-10-12 MED ORDER — ACETAMINOPHEN 650 MG RE SUPP: 650.0000 mg | Freq: Four times a day (QID) | RECTAL | Status: DC | PRN

## 2022-10-12 MED ORDER — TAMSULOSIN HCL 0.4 MG PO CAPS
0.4000 mg | ORAL_CAPSULE | Freq: Every day | ORAL | Status: DC
  Administered 2022-10-12 – 2022-10-13 (×2): 0.4 mg via ORAL
  Filled 2022-10-12 (×2): qty 1

## 2022-10-12 MED ORDER — PEG 3350-KCL-NA BICARB-NACL 420 G PO SOLR
4000.0000 mL | Freq: Once | ORAL | Status: AC
  Administered 2022-10-12: 4000 mL via ORAL
  Filled 2022-10-12: qty 4000

## 2022-10-12 MED ORDER — OXYCODONE HCL 5 MG PO TABS: 5.0000 mg | ORAL_TABLET | ORAL | Status: DC | PRN

## 2022-10-12 MED ORDER — SODIUM CHLORIDE 0.9 % IV SOLN: INTRAVENOUS | Status: DC

## 2022-10-12 MED ORDER — ONDANSETRON HCL 4 MG/2ML IJ SOLN
4.0000 mg | Freq: Four times a day (QID) | INTRAMUSCULAR | Status: DC | PRN
  Administered 2022-10-12 (×2): 4 mg via INTRAVENOUS
  Filled 2022-10-12 (×2): qty 2

## 2022-10-12 MED ORDER — PANTOPRAZOLE SODIUM 40 MG IV SOLR
40.0000 mg | Freq: Two times a day (BID) | INTRAVENOUS | Status: DC
  Administered 2022-10-12 – 2022-10-13 (×4): 40 mg via INTRAVENOUS
  Filled 2022-10-12 (×4): qty 10

## 2022-10-12 MED ORDER — SERTRALINE HCL 50 MG PO TABS
50.0000 mg | ORAL_TABLET | Freq: Every day | ORAL | Status: DC
  Administered 2022-10-12 – 2022-10-14 (×3): 50 mg via ORAL
  Filled 2022-10-12 (×3): qty 1

## 2022-10-12 MED ORDER — SERTRALINE HCL 25 MG PO TABS: 12.5000 mg | ORAL_TABLET | Freq: Every day | ORAL | Status: DC

## 2022-10-12 MED ORDER — ONDANSETRON HCL 4 MG PO TABS: 4.0000 mg | ORAL_TABLET | Freq: Four times a day (QID) | ORAL | Status: DC | PRN

## 2022-10-12 MED ORDER — SODIUM CHLORIDE 0.9 % IV BOLUS
500.0000 mL | Freq: Once | INTRAVENOUS | Status: AC
  Administered 2022-10-12: 500 mL via INTRAVENOUS

## 2022-10-12 MED ORDER — GABAPENTIN 100 MG PO CAPS: 100.0000 mg | ORAL_CAPSULE | Freq: Every day | ORAL | Status: DC

## 2022-10-12 MED ORDER — ACETAMINOPHEN 325 MG PO TABS: 650.0000 mg | ORAL_TABLET | Freq: Four times a day (QID) | ORAL | Status: DC | PRN

## 2022-10-12 MED ORDER — BISACODYL 5 MG PO TBEC
10.0000 mg | DELAYED_RELEASE_TABLET | Freq: Once | ORAL | Status: AC
  Administered 2022-10-12: 10 mg via ORAL
  Filled 2022-10-12: qty 2

## 2022-10-12 MED ORDER — GABAPENTIN 100 MG PO CAPS
100.0000 mg | ORAL_CAPSULE | Freq: Three times a day (TID) | ORAL | Status: DC
  Administered 2022-10-12 – 2022-10-14 (×7): 100 mg via ORAL
  Filled 2022-10-12 (×7): qty 1

## 2022-10-12 NOTE — ED Notes (Signed)
This RN went in to assess pt and she stated that she felt like she was passing big clots. Pt changed and multiple large clots in brief.

## 2022-10-12 NOTE — Progress Notes (Signed)
Patient seen and examined; admitted after midnight secondary to bright red blood per rectum.  From colonoscopy evaluation in 2019 patient with history of polyps, internal hemorrhoids and diverticulosis.  Presentation especially with painless bleeding most likely suggesting diverticulosis.  GI service has been consulted and will follow any further recommendations.  Patient is hemodynamically stable at this moment.  Refer to H&P written by Dr.Zierle-Ghosh on 10/12/22 for further info/details on admission.  Plan: -Continue to follow hemoglobin trend closely and transfuse if needed. -Continue supportive care and avoid the use of heparin products/NSAIDs. -Follow GI service recommendations; planning for possible colonoscopy eval on 10/13/22. -CLD and NPO after midnight. -continue PPI.  Barton Dubois MD [phone]

## 2022-10-12 NOTE — Assessment & Plan Note (Addendum)
-  Cessation counseling provided -Patient contemplating quitting. -Nicotine patch declined.

## 2022-10-12 NOTE — H&P (Signed)
History and Physical    Patient: Pamela Henson [id number] DOB: 1950-12-13 DOA: 10/12/2022 DOS: the patient was seen and examined on 10/12/2022 PCP: Birdie Sons, MD  Patient coming from: Home  Chief Complaint:  Chief Complaint  Patient presents with   Rectal Bleeding   HPI: Pamela Henson is a 72 y.o. female with medical history significant of bipolar 1 disorder, depression, tobacco use disorder, and more presents the ED with a chief complaint of bright red blood per rectum.  Patient reports that she has been in her normal state of health recently, and then today at 11:30 PM she had what she thought was incontinence of bowel and was actually blood running down her leg from her rectum.  Patient reports that she has had almost constant bloody bowel movements since then.  She had several at home, she had several more in the ED.  Patient reports that she has had no abdominal pain.  She has been more constipated recently than normal.  Patient reports that in the past she had urinary retention and constipation, but since then she has been using a probiotic and has not had issues with constipation.  3 days ago she started a new probiotic, which has not worked as well, so she has been constipated.  She does not remember the name of the new probiotic.  Patient reports that she has had some lightheadedness since all this started.  She denies chest pain, fever.  She reports she may have had some straining to get urinary flow earlier in the day, but no dysuria.  Her bladder had a fullness feeling to it, but nothing that she even took note of.  Patient is not on any blood thinners.  She has been using ibuprofen more regularly than normal due to headaches from allergies.  She reports she only takes 200 mg at a time and does not even use it every day. Patient is a current smoker half a pack per day.  She does not drink.  She does not use illicit drugs.  She is vaccinated for COVID and flu.  Patient is  DNR.   Review of Systems: As mentioned in the history of present illness. All other systems reviewed and are negative. Past Medical History:  Diagnosis Date   Bipolar 1 disorder ([place])    Bipolar 1 disorder ([place])    History of chicken pox    History of measles    History of mumps    Past Surgical History:  Procedure Laterality Date   ABDOMINAL HYSTERECTOMY  1979   age 50 due to vaginal bleeding   COLONOSCOPY N/A 10/30/2017   Procedure: COLONOSCOPY;  Surgeon: Rogene Houston, MD;  Location: [facility];  Service: Endoscopy;  Laterality: N/A;  1225   Social History:  reports that she quit smoking about 24 years ago. Her smoking use included cigarettes. She has a 15.00 pack-year smoking history. She has never used smokeless tobacco. She reports that she does not drink alcohol and does not use drugs.  No Known Allergies  Family History  Problem Relation Age of Onset   Stroke Mother 9   Stroke Father 55   Colon cancer Neg Hx     Prior to Admission medications   Medication Sig Start Date End Date Taking? Authorizing Provider  gabapentin (NEURONTIN) 100 MG capsule Take 100 mg by mouth at bedtime.     [provider]  ibuprofen (ADVIL,MOTRIN) 200 MG tablet Take 200 mg by mouth every  8 (eight) hours as needed for mild pain.    [provider]  sertraline (ZOLOFT) 25 MG tablet Take 12.5 mg by mouth at bedtime.     [provider]  tamsulosin (FLOMAX) 0.4 MG CAPS capsule Take 1 capsule (0.4 mg total) by mouth daily. Patient taking differently: Take 0.4 mg by mouth at bedtime. 05/25/16   Birdie Sons, MD    Physical Exam: Vitals:   10/12/22 0230 10/12/22 0300 10/12/22 0330 10/12/22 0400  BP: (!) 145/73 123/68 123/71 121/72  Pulse: 100 (!) 106 (!) 122 (!) 113  Resp: (!) 27 17 (!) 21 18  Temp: 98 F (36.7 C)     TempSrc: Oral     SpO2: 91% 90% 90% 93%  Weight:      Height:       1.  General: Patient lying supine in bed,  no acute distress   2.  Psychiatric: Alert and oriented x 3, mood and behavior normal for situation, pleasant and cooperative with exam   3. Neurologic: Speech and language are normal, face is symmetric, moves all 4 extremities voluntarily, at baseline without acute deficits on limited exam   4. HEENMT:  Head is atraumatic, normocephalic, pupils reactive to light, neck is supple, trachea is midline, mucous membranes are moist   5. Respiratory : Lungs are clear to auscultation bilaterally without wheezing, rhonchi, rales, no cyanosis, no increase in work of breathing or accessory muscle use   6. Cardiovascular : Heart rate normal, rhythm is regular, no murmurs, rubs or gallops, no peripheral edema, peripheral pulses palpated   7. Gastrointestinal:  Abdomen is soft, nondistended, nontender to palpation bowel sounds active, no masses or organomegaly palpated   8. Skin:  Skin is warm, dry and intact without rashes, acute lesions, or ulcers on limited exam   9.Musculoskeletal:  No acute deformities or trauma, no asymmetry in tone, no peripheral edema, peripheral pulses palpated, no tenderness to palpation in the extremities  Data Reviewed: In the ED Afebrile, heart rate 96-1 37, respiratory rate 18-26, blood pressure 125/81-135/94, satting 89-94% No leukocytosis with a white blood cell count of 9.7, initial hemoglobin 14.7 and then 13.2, platelets 341 Chemistry is unremarkable Chest x-ray shows nodular changes in the left midlung There was gross bright red blood on rectal exam Admission was requested for GI bleed  Assessment and Plan: * GI bleed - Multiple grossly bloody bowel movements in the ED - Hemoglobin is dropped 1 g in 2 hours from 14.7-13.2 -Monitor CBC every 6 hours - Consult GI, Dr. Abbey Chatters notified via epic chat - Last colonoscopy was in February 2019 and revealed 2 polyps, hemorrhoids, and diverticulosis - Patient has been typed and screened - Continue Protonix - Continue to  monitor  Tobacco use disorder - Smoking half a pack a day - Counseled on importance of cessation - Declines nicotine patch at this time  Clinical depression - Continue Zoloft      Advance Care Planning:   Code Status: DNR  Consults: GI  Family Communication: No family at bedside  Severity of Illness: The appropriate patient status for this patient is OBSERVATION. Observation status is judged to be reasonable and necessary in order to provide the required intensity of service to ensure the patient's safety. The patient's presenting symptoms, physical exam findings, and initial radiographic and laboratory data in the context of their medical condition is felt to place them at decreased risk for further clinical deterioration. Furthermore, it is anticipated that the patient  will be medically stable for discharge from the hospital within 2 midnights of admission.   Author: Rolla Plate, DO 10/12/2022 4:31 AM  For on call review [email or web address].

## 2022-10-12 NOTE — ED Provider Notes (Signed)
[facility] Provider Note   CSN: [id number] Arrival date & time: 10/12/22  0034     History  Chief Complaint  Patient presents with   Rectal Bleeding    Lauriel Helin is a 72 y.o. female.  Patient is a 72 year old female with past medical history of colon polyps, bipolar disorder.  Patient presenting today for evaluation of rectal bleeding.  She reports standing in her home, then began to feel what she thought was diarrhea.  She went to the bathroom, then passed a large quantity of bright red blood.  This has happened on several occasions.  She denies to me that she is having any abdominal pain.  She denies any fevers or chills.  She denies any dizziness or lightheadedness.  She has had colonoscopies in the past and has had polyps removed, but no comment on diverticulosis or other abnormalities.  It has been several years since her last colonoscopy.  The history is provided by the patient.       Home Medications Prior to Admission medications   Medication Sig Start Date End Date Taking? Authorizing Provider  gabapentin (NEURONTIN) 100 MG capsule Take 100 mg by mouth at bedtime.     [provider]  ibuprofen (ADVIL,MOTRIN) 200 MG tablet Take 200 mg by mouth every 8 (eight) hours as needed for mild pain.    [provider]  sertraline (ZOLOFT) 25 MG tablet Take 12.5 mg by mouth at bedtime.     [provider]  tamsulosin (FLOMAX) 0.4 MG CAPS capsule Take 1 capsule (0.4 mg total) by mouth daily. Patient taking differently: Take 0.4 mg by mouth at bedtime. 05/25/16   Birdie Sons, MD      Allergies    Patient has no known allergies.    Review of Systems   Review of Systems  All other systems reviewed and are negative.   Physical Exam Updated Vital Signs BP (!) 135/94   Pulse (!) 137   Resp (!) 26   Ht '5\' 5"'$  (1.651 m)   Wt 58 kg   SpO2 93%   BMI 21.28 kg/m  Physical Exam Vitals and nursing note reviewed.   Constitutional:      General: She is not in acute distress.    Appearance: She is well-developed. She is not diaphoretic.  HENT:     Head: Normocephalic and atraumatic.  Cardiovascular:     Rate and Rhythm: Normal rate and regular rhythm.     Heart sounds: No murmur heard.    No friction rub. No gallop.  Pulmonary:     Effort: Pulmonary effort is normal. No respiratory distress.     Breath sounds: Normal breath sounds. No wheezing.  Abdominal:     General: Bowel sounds are normal. There is no distension.     Palpations: Abdomen is soft.     Tenderness: There is no abdominal tenderness.  Genitourinary:    Comments: Rectal exam reveals no obvious hemorrhoids or fissures.  There is no stool in the rectal vault and there is gross blood noted on exam. Musculoskeletal:        General: Normal range of motion.     Cervical back: Normal range of motion and neck supple.  Skin:    General: Skin is warm and dry.  Neurological:     General: No focal deficit present.     Mental Status: She is alert and oriented to person, place, and time.     ED  Results / Procedures / Treatments   Labs (all labs ordered are listed, but only abnormal results are displayed) Labs Reviewed - No data to display  EKG None  Radiology No results found.  Procedures Procedures    Medications Ordered in ED Medications - No data to display  ED Course/ Medical Decision Making/ A&P  Patient is a 72 year old female with history of colon polyps presenting with complaints of rectal bleeding that started earlier this evening.  She reports several episodes of this at home where she passed bright red blood along with clots.  She is denying any pain.  She arrives here with stable vital signs and is afebrile.  Workup initiated including CBC and basic metabolic panel.  Patient has no white count and hemoglobin is 14.7.  Electrolyte panel basically unremarkable.  Patient will be admitted to the hospitalist service  for further workup and observation.  Patient had an additional episode of rectal bleeding while here in the ER that was a considerable amount.  Final Clinical Impression(s) / ED Diagnoses Final diagnoses:  None    Rx / DC Orders ED Discharge Orders     None         Veryl Speak, MD 10/12/22 [phone]

## 2022-10-12 NOTE — Progress Notes (Signed)
   10/12/22 1848  Assess: MEWS Score  Temp 97.7 F (36.5 C)  BP 128/65  Pulse Rate (!) 125  Resp (!) 22  Level of Consciousness Alert  SpO2 91 %  O2 Device Nasal Cannula  O2 Flow Rate (L/min) 3 L/min  Assess: MEWS Score  MEWS Temp 0  MEWS Systolic 0  MEWS Pulse 2  MEWS RR 1  MEWS LOC 0  MEWS Score 3  MEWS Score Color Yellow  Assess: if the MEWS score is Yellow or Red  Were vital signs taken at a resting state? Yes  Focused Assessment No change from prior assessment  Does the patient meet 2 or more of the SIRS criteria? No  MEWS guidelines implemented *See Row Information* Yes  Treat  Pain Scale 0-10  Pain Score 0  Take Vital Signs  Increase Vital Sign Frequency  Yellow: Q 2hr X 2 then Q 4hr X 2, if remains yellow, continue Q 4hrs  Escalate  MEWS: Escalate Yellow: discuss with charge nurse/RN and consider discussing with provider and RRT  Notify: Charge Nurse/RN  Name of Charge Nurse/RN Notified Crystal RN  Date Charge Nurse/RN Notified 10/12/22  Time Charge Nurse/RN Notified Charles  Provider Notification  Provider Name/Title Dyann Kief  Date Provider Notified 10/12/22  Time Provider Notified 1857  Method of Notification Page  Notification Reason Other (Comment) (yellow MEWS)  Assess: SIRS CRITERIA  SIRS Temperature  0  SIRS Pulse 1  SIRS Respirations  1  SIRS WBC 1  SIRS Score Sum  3

## 2022-10-12 NOTE — ED Notes (Signed)
O2 sats dropped to 83%, pt denies feeling short of breath or pain in her chest. NAD. Pt placed on 2L [place] and O2 sats increased to 93%. Provider notified

## 2022-10-12 NOTE — ED Notes (Signed)
Pt ambulation attempted, to the bathroom. Pt became significantly dizzy and lightheaded upon standing. She was unable to walk more than 5 feet, due to unsteady gait. Pt able to use bedside commode. BM contents appeared bloody and thin. Several blood clots noted. Pt reported nausea and dry heaves while sitting on the bedside commode. Zofran administered. Pt still denies pain.

## 2022-10-12 NOTE — Assessment & Plan Note (Addendum)
-  Continue Zoloft -Mood stable.

## 2022-10-12 NOTE — ED Triage Notes (Signed)
Pt arrived to triage complaining of sudden onset of bright red rectal bleeding, pt states she was standing up when she started to feel blood running down her legs. States that she has filled the commode 3 times tonight and has some small clot.   Denies hx of rectal bleeding and denies any pain at this time  States that earlier in the day she had some abdominal pain that felt like it was in her bladder but that is now gone. Recent change to the probiotic that she takes.

## 2022-10-12 NOTE — Progress Notes (Signed)
  Transition of Care [facility]) Screening Note   Patient Details  Name: Pamela Henson Date of Birth: 07-30-1951   Transition of Care [facility]) CM/SW Contact:    Iona Beard, [place] Phone Number: 10/12/2022, 4:05 PM    Transition of Care Department [facility]) has reviewed patient and no TOC needs have been identified at this time. We will continue to monitor patient advancement through interdisciplinary progression rounds. If new patient transition needs arise, please place a TOC consult.

## 2022-10-12 NOTE — Consult Note (Addendum)
Gastroenterology Consult   Referring Provider: No ref. provider found Primary Care Physician:  Birdie Sons, MD Primary Gastroenterologist: Harvel Quale, MD; previously Dr. Laural Golden  Patient ID: Pamela Henson [place]; [id number]; Feb 27, 1951   Admit date: 10/12/2022  LOS: 0 days   Date of Consultation: 10/12/2022  Reason for Consultation:  rectal bleeding with bright red and clots  History of Present Illness   Pamela Henson is a 72 y.o. year old female with history of bipolar 1 disorder, depression, tobacco use disorder who presented to the ED with bright red blood per rectum with first episode last night/reported multiple episodes at home and a few episodes in the ED.  GI consulted for further evaluation.  ED course: Mild desaturation with oxygen saturation 89 to 94%, vital signs stable otherwise. Initial hemoglobin 14.7 then dropped to 13.2, platelets 321, WBC 9.7, CMP unremarkable Chest x-ray evaluation Per EDP, gross bright red blood on rectal exam   Consult: Colonoscopy 10/30/17: -2 small polyps at hepatic flexure s/p biopsy (tubular adenoma) -Sigmoid diverticulosis -External hemorrhoids -Repeat TCS in 7 years  Patient states she was near her sink and then she all of a sudden felt something running down her leg and initially though it was diarrhea because it was a little dark and then when she went to the commode it was bright red blood. Got in the shower to wash off and when she got out she sat on the commode a total of 3 times and all contents were bright red blood. Has had multiple episodes in the ED. Denies abdominal pain. Did have some bladder pain yesterday but that resolved prior to bleeding beginning. Notes a history of constipation and probiotic got her regular but did notice this week she decided to try a new probiotic and has had less frequent BM. Has been straining some this wee. Does not think she has had hemorrhoids recent.   Takes ibuprofen. Takes a  goodys powder almost every night, has taken them off and on for many years. Most recently has been taking goody powders 4-5 days out of the week.  Has sinus troubles and allergies and when she has sore throat and headaches she will take some ibuprofen. Smokes some (maybe 1/2 pack every 2 days). No alcohol. Has rare reflux, may take pepcid a couple times per year. Not on any blood thinners.   Past Medical History:  Diagnosis Date   Bipolar 1 disorder ([place])    Bipolar 1 disorder ([place])    History of chicken pox    History of measles    History of mumps     Past Surgical History:  Procedure Laterality Date   ABDOMINAL HYSTERECTOMY  1979   age 68 due to vaginal bleeding   COLONOSCOPY N/A 10/30/2017   Procedure: COLONOSCOPY;  Surgeon: Rogene Houston, MD;  Location: [facility];  Service: Endoscopy;  Laterality: N/A;  1225    Prior to Admission medications   Medication Sig Start Date End Date Taking? Authorizing Provider  gabapentin (NEURONTIN) 100 MG capsule Take 100 mg by mouth 3 (three) times daily.   Yes [provider]  ibuprofen (ADVIL,MOTRIN) 200 MG tablet Take 200 mg by mouth every 8 (eight) hours as needed for mild pain.   Yes [provider]  sertraline (ZOLOFT) 100 MG tablet Take 50 mg by mouth daily. 10/08/22  Yes [provider]  tamsulosin (FLOMAX) 0.4 MG CAPS capsule Take 1 capsule (0.4 mg total) by mouth daily. Patient taking differently:  Take 0.4 mg by mouth at bedtime. 05/25/16  Yes Birdie Sons, MD    Current Facility-Administered Medications  Medication Dose Route Frequency Provider Last Rate Last Admin   acetaminophen (TYLENOL) tablet 650 mg  650 mg Oral Q6H PRN Zierle-Ghosh, Asia B, DO       Or   acetaminophen (TYLENOL) suppository 650 mg  650 mg Rectal Q6H PRN Zierle-Ghosh, Asia B, DO       gabapentin (NEURONTIN) capsule 100 mg  100 mg Oral TID Barton Dubois, MD       ondansetron [facility]) tablet 4 mg  4 mg Oral Q6H PRN Zierle-Ghosh,  Asia B, DO       Or   ondansetron (ZOFRAN) injection 4 mg  4 mg Intravenous Q6H PRN Zierle-Ghosh, Asia B, DO   4 mg at 10/12/22 0913   oxyCODONE (Oxy IR/ROXICODONE) immediate release tablet 5 mg  5 mg Oral Q4H PRN Zierle-Ghosh, Asia B, DO       pantoprazole (PROTONIX) injection 40 mg  40 mg Intravenous Q12H Zierle-Ghosh, Asia B, DO   40 mg at 10/12/22 0338   sertraline (ZOLOFT) tablet 50 mg  50 mg Oral Daily Barton Dubois, MD       tamsulosin (FLOMAX) capsule 0.4 mg  0.4 mg Oral QHS Zierle-Ghosh, Asia B, DO       Current Outpatient Medications  Medication Sig Dispense Refill   gabapentin (NEURONTIN) 100 MG capsule Take 100 mg by mouth 3 (three) times daily.     ibuprofen (ADVIL,MOTRIN) 200 MG tablet Take 200 mg by mouth every 8 (eight) hours as needed for mild pain.     sertraline (ZOLOFT) 100 MG tablet Take 50 mg by mouth daily.     tamsulosin (FLOMAX) 0.4 MG CAPS capsule Take 1 capsule (0.4 mg total) by mouth daily. (Patient taking differently: Take 0.4 mg by mouth at bedtime.) 30 capsule 0    Allergies as of 10/12/2022   (No Known Allergies)    Family History  Problem Relation Age of Onset   Stroke Mother 8   Stroke Father 25   Colon cancer Neg Hx     Social History   Socioeconomic History   Marital status: Widowed    Spouse name: Not on file   Number of children: 1   Years of education: Not on file   Highest education level: Not on file  Occupational History   Occupation: Disablity    Comment: Due to back problems  Tobacco Use   Smoking status: Former    Packs/day: 1.00    Years: 15.00    Total pack years: 15.00    Types: Cigarettes    Quit date: 09/24/1998    Years since quitting: 24.0   Smokeless tobacco: Never  Vaping Use   Vaping Use: Never used  Substance and Sexual Activity   Alcohol use: No    Alcohol/week: 0.0 standard drinks of alcohol   Drug use: No   Sexual activity: Not on file  Other Topics Concern   Not on file  Social History Narrative   Not  on file   Social Determinants of Health   Financial Resource Strain: Not on file  Food Insecurity: Not on file  Transportation Needs: Not on file  Physical Activity: Not on file  Stress: Not on file  Social Connections: Not on file  Intimate Partner Violence: Not on file     Review of Systems   Gen: Denies any fever, chills, loss of appetite, change in  weight or weight loss CV: Denies chest pain, heart palpitations, syncope, edema  Resp: Denies shortness of breath with rest, cough, wheezing, coughing up blood, and pleurisy. GI: See HPI GU : Denies urinary burning, blood in urine, urinary frequency, and urinary incontinence. MS: Denies joint pain, limitation of movement, swelling, cramps, and atrophy.  Derm: Denies rash, itching, dry skin, hives. Psych: Denies depression, anxiety, memory loss, hallucinations, and confusion. Heme: Denies bruising or bleeding Neuro:  Denies any headaches, dizziness, paresthesias, shaking  Physical Exam   Vital Signs in last 24 hours: Temp:  [98 F (36.7 C)-98.2 F (36.8 C)] 98.2 F (36.8 C) (01/19 0800) Pulse Rate:  [96-137] 104 (01/19 0800) Resp:  [16-27] 19 (01/19 0800) BP: (102-145)/(66-94) 102/66 (01/19 0800) SpO2:  [89 %-95 %] 95 % (01/19 0800) Weight:  [58 kg] 58 kg (01/19 0102)    General:   Alert,  Well-developed, well-nourished, pleasant and cooperative in NAD Head:  Normocephalic and atraumatic. Eyes:  Sclera clear, no icterus.   Conjunctiva pink. Ears:  Normal auditory acuity. Lungs:  Clear throughout to auscultation.   No wheezes, crackles, or rhonchi. No acute distress.  On 3 L nasal cannula Heart:  Regular rate and rhythm; no murmurs, clicks, rubs,  or gallops. Abdomen:  Soft, nontender and nondistended. No masses, hepatosplenomegaly or hernias noted. Normal bowel sounds, without guarding, and without rebound.   Rectal: Deferred, recently completed by EDP Msk:  Symmetrical without gross deformities. Normal  posture. Extremities:  Without clubbing or edema. Neurologic:  Alert and  oriented x4. Skin:  Intact without significant lesions or rashes. Psych:  Alert and cooperative. Normal mood and affect.  Intake/Output from previous day: No intake/output data recorded. Intake/Output this shift: No intake/output data recorded.   Labs/Studies   Recent Labs Recent Labs    10/12/22 0106 10/12/22 0305 10/12/22 0645  WBC 9.7 12.0* 10.1  HGB 14.7 13.2 11.8*  HCT 43.5 41.5 37.1  PLT 341 294 273   BMET Recent Labs    10/12/22 0106 10/12/22 0305  NA 138 139  K 3.7 3.8  CL 106 108  CO2 22 25  GLUCOSE 117* 127*  BUN 20 19  CREATININE 0.62 0.51  CALCIUM 8.9 8.5*   LFT Recent Labs    10/12/22 0305  PROT 6.2*  ALBUMIN 3.2*  AST 22  ALT 12  ALKPHOS 92  BILITOT 0.6   PT/INR Recent Labs    10/12/22 0106  LABPROT 12.1  INR 0.9   Hepatitis Panel No results for input(s): "HEPBSAG", "HCVAB", "HEPAIGM", "HEPBIGM" in the last 72 hours. C-Diff No results for input(s): "CDIFFTOX" in the last 72 hours.  Radiology/Studies DG Chest Port 1 View  Result Date: 10/12/2022 CLINICAL DATA:  Hypoxia EXAM: PORTABLE CHEST 1 VIEW COMPARISON:  None Available. FINDINGS: Cardiac shadow is within normal limits. The lungs are hyperinflated. Aortic calcifications are noted. No focal infiltrate or effusion is seen. Somewhat nodular density is noted in the left mid lung overlying the anterior aspect of the third rib. This may be related to confluence of parenchymal shadows although the possibility of underlying nodule deserves consideration. IMPRESSION: Nodular changes in the left mid lung. Noncontrast CT is recommended for further evaluation. Electronically Signed   By: Inez Catalina M.D.   On: 10/12/2022 01:59     Assessment   Jamira Barfuss is a 72 y.o. year old female with history of bipolar 1 disorder, depression, tobacco use disorder who presented to the ED with bright red blood per rectum  with  first episode last night/reported multiple episodes at home and a few episodes in the ED.  GI consulted for further evaluation.  Rectal bleeding, anemia: Presented with chief complaint of multiple episodes of bright red blood per rectum at home with associated lightheadedness and dizziness.  Hemoglobin initially 14.7 yesterday down to 13.2 and further to 11.8 this morning.  BUN within normal limits, less likely rapid upper GI bleed.  EDP reported gross bright red blood on rectal exam.  Last TCS in 2019 with single polyp removed, external hemorrhoids, sigmoid diverticulosis.  Denies any abdominal pain.  Melena not present.  Does report recent constipation and some mild straining and does have a history of external hemorrhoids.  Prior to this last week has not been experiencing any constipation.  Observed bright red blood with small clots within her bedside commode.  Admits to frequent NSAID and aspirin powder use.  Diverticular bleed remains in differential as well as benign anorectal source with hemorrhoids and NSAID/aspirin induced colitis or possible colonic AVMs.  She will benefit from colonoscopy while inpatient.If TCS negative we will plan for EGD to evaluate possible bleeding from NSAID induced gastritis, duodenitis, PUD.   If she develops hemodynamic instability CTA A/P should be obtained.   Plan / Recommendations   Continue to monitor H/H, transfuse for hgb < 7 PPI twice daily Iron panel Clear liquids Prep to begin at 6pm NPO at midnight TCS tomorrow with Dr. Jenetta Downer, if negative will plan to perform EGD CTA abdomen/pelvis if significant drop in Hgb or significant hypotension     10/12/2022, 9:45 AM  Venetia Night, MSN, FNP-BC, AGACNP-BC [facility]

## 2022-10-12 NOTE — Assessment & Plan Note (Addendum)
-  Multiple grossly bloody bowel movements in the ED - Hemoglobin is dropped approximately 3-4 g since admission. -Secondary to diverticular bleed; at this moment self-limited. -Status post colonoscopy with removal of 2 polyps and noticing extensive diverticulosis that at this moment was not actively bleeding. -There was still some blood seen in the colon lumen. -GI service recommended to advance diet and assess for tolerance; patient tolerated diet without problems and felt stable to go home. -Repeat CBC at follow-up visit to assess hemoglobin and stability.

## 2022-10-13 ENCOUNTER — Encounter (HOSPITAL_COMMUNITY): Payer: Self-pay | Admitting: Internal Medicine

## 2022-10-13 ENCOUNTER — Inpatient Hospital Stay (HOSPITAL_COMMUNITY): Payer: Medicare Other | Admitting: Anesthesiology

## 2022-10-13 ENCOUNTER — Encounter (HOSPITAL_COMMUNITY)
Admission: EM | Disposition: A | Discharge status: Home / Self Care | Payer: Self-pay | Source: Home / Self Care | Attending: Internal Medicine

## 2022-10-13 DIAGNOSIS — K635 Polyp of colon: Secondary | ICD-10-CM | POA: Diagnosis not present

## 2022-10-13 DIAGNOSIS — D62 Acute posthemorrhagic anemia: Secondary | ICD-10-CM

## 2022-10-13 DIAGNOSIS — K922 Gastrointestinal hemorrhage, unspecified: Secondary | ICD-10-CM | POA: Diagnosis not present

## 2022-10-13 DIAGNOSIS — K625 Hemorrhage of anus and rectum: Secondary | ICD-10-CM

## 2022-10-13 DIAGNOSIS — F32A Depression, unspecified: Secondary | ICD-10-CM | POA: Diagnosis not present

## 2022-10-13 DIAGNOSIS — K5791 Diverticulosis of intestine, part unspecified, without perforation or abscess with bleeding: Secondary | ICD-10-CM | POA: Diagnosis not present

## 2022-10-13 DIAGNOSIS — K5730 Diverticulosis of large intestine without perforation or abscess without bleeding: Secondary | ICD-10-CM

## 2022-10-13 DIAGNOSIS — F17200 Nicotine dependence, unspecified, uncomplicated: Secondary | ICD-10-CM | POA: Diagnosis not present

## 2022-10-13 HISTORY — PX: COLONOSCOPY WITH PROPOFOL: SHX5780

## 2022-10-13 HISTORY — PX: POLYPECTOMY: SHX5525

## 2022-10-13 HISTORY — PX: ESOPHAGOGASTRODUODENOSCOPY (EGD) WITH PROPOFOL: SHX5813

## 2022-10-13 SURGERY — COLONOSCOPY WITH PROPOFOL
Anesthesia: General

## 2022-10-13 MED ORDER — PROPOFOL 10 MG/ML IV BOLUS
INTRAVENOUS | Status: AC
  Filled 2022-10-13: qty 20

## 2022-10-13 MED ORDER — LACTATED RINGERS IV SOLN: INTRAVENOUS | Status: DC | PRN

## 2022-10-13 MED ORDER — PROPOFOL 10 MG/ML IV BOLUS
INTRAVENOUS | Status: DC | PRN
  Administered 2022-10-13: 200 mg via INTRAVENOUS
  Administered 2022-10-13: 100 mg via INTRAVENOUS

## 2022-10-13 NOTE — Op Note (Signed)
[facility] Patient Name: Pamela Henson Procedure Date: 10/13/2022 8:39 AM MRN: [id number] Date of Birth: 1950-10-12 Attending MD: Maylon Peppers , , [id number] CSN: [id number] Age: 72 Admit Type: Inpatient Procedure:                Colonoscopy Indications:              Rectal bleeding Providers:                Maylon Peppers, Lurline Del, RN, Casimer Bilis, Technician Referring MD:              Medicines:                Monitored Anesthesia Care Complications:            No immediate complications. Estimated Blood Loss:     Estimated blood loss: none. Procedure:                Pre-Anesthesia Assessment:                           - Prior to the procedure, a History and Physical                            was performed, and patient medications, allergies                            and sensitivities were reviewed. The patient's                            tolerance of previous anesthesia was reviewed.                           - The risks and benefits of the procedure and the                            sedation options and risks were discussed with the                            patient. All questions were answered and informed                            consent was obtained.                           - ASA Grade Assessment: III - A patient with severe                            systemic disease.                           After obtaining informed consent, the colonoscope                            was passed under direct vision. Throughout the  procedure, the patient's blood pressure, pulse, and                            oxygen saturations were monitored continuously. The                            [id number]-HQ190L ([id number]) scope was introduced through                            the anus and advanced to the the terminal ileum.                            The colonoscopy was performed without difficulty.                             The patient tolerated the procedure well. The                            quality of the bowel preparation was adequate. Scope In: 9:11:51 AM Scope Out: 9:40:12 AM Scope Withdrawal Time: 0 hours 21 minutes 2 seconds  Total Procedure Duration: 0 hours 28 minutes 21 seconds  Findings:      The perianal and digital rectal examinations were normal.      The terminal ileum appeared normal.      Two sessile polyps were found in the descending colon and cecum. The       polyps were 4 to 5 mm in size. These polyps were removed with a cold       snare. Resection and retrieval were complete.      Clotted blood and hematin was found in the rectum, in the sigmoid colon,       in the descending colon and in the transverse colon. No active bleeding       was found.      Multiple large-mouthed and small-mouthed diverticula were found in the       sigmoid colon and descending colon.      The retroflexed view of the distal rectum and anal verge was normal and       showed no anal or rectal abnormalities.      Note: bleeding likely due to diverticular bleeding, which has subsided       on its own Impression:               - The examined portion of the ileum was normal.                           - Two 4 to 5 mm polyps in the descending colon and                            in the cecum, removed with a cold snare. Resected                            and retrieved.                           - Blood in the rectum, in the sigmoid colon, in the  descending colon and in the transverse colon.                           - Diverticulosis in the sigmoid colon and in the                            descending colon.                           - The distal rectum and anal verge are normal on                            retroflexion view. Moderate Sedation:      Per Anesthesia Care Recommendation:           - Return patient to hospital ward for ongoing care.                           - Soft  diet.                           - Await pathology results.                           - Repeat colonoscopy for surveillance based on                            pathology results.                           - No high dose aspirin (Goody/BC powders),                            ibuprofen, naproxen, or other non-steroidal                            anti-inflammatory drugs. Procedure Code(s):        --- Professional ---                           [phone], Colonoscopy, flexible; with removal of                            tumor(s), polyp(s), or other lesion(s) by snare                            technique Diagnosis Code(s):        --- Professional ---                           D12.4, Benign neoplasm of descending colon                           D12.0, Benign neoplasm of cecum                           K62.5, Hemorrhage of anus and rectum  K92.2, Gastrointestinal hemorrhage, unspecified                           K57.30, Diverticulosis of large intestine without                            perforation or abscess without bleeding CPT copyright 2022 American Medical Association. All rights reserved. The codes documented in this report are preliminary and upon coder review may  be revised to meet current compliance requirements. Maylon Peppers, MD Maylon Peppers,  10/13/2022 9:47:52 AM This report has been signed electronically. Number of Addenda: 0

## 2022-10-13 NOTE — Transfer of Care (Signed)
Immediate Anesthesia Transfer of Care Note  Patient: Pamela Henson  Procedure(s) Performed: COLONOSCOPY WITH PROPOFOL ESOPHAGOGASTRODUODENOSCOPY (EGD) WITH PROPOFOL POLYPECTOMY  Patient Location: PACU  Anesthesia Type:General  Level of Consciousness: awake and alert   Airway & Oxygen Therapy: Patient Spontanous Breathing and Patient connected to nasal cannula oxygen  Post-op Assessment: Report given to RN and Post -op Vital signs reviewed and stable  Post vital signs: Reviewed and stable  Last Vitals:  Vitals Value Taken Time  BP 100/54 10/13/22 0947  Temp 98   Pulse 91 10/13/22 0948  Resp 24 10/13/22 0948  SpO2 95 % 10/13/22 0948  Vitals shown include unvalidated device data.  Last Pain:  Vitals:   10/13/22 0859  TempSrc: Oral  PainSc: 0-No pain         Complications: No notable events documented.

## 2022-10-13 NOTE — Assessment & Plan Note (Addendum)
-  Almost 4 g lost in the setting of diverticular bleed. -No transfusion required -Hemodynamically stable and expressing no chest pain, dizziness, lightheadedness or any other symptoms of concerns. -Hemoglobin at discharge 9.2.

## 2022-10-13 NOTE — Progress Notes (Signed)
Progress Note   Patient: Pamela Henson [facility] ZOX:[id number] DOB: Nov 14, 1950 DOA: 10/12/2022     1 DOS: the patient was seen and examined on 10/13/2022   Brief hospital course: As per H&P written by Dr. Clearence Ped on 10/12/22  Pamela Henson is a 72 y.o. female with medical history significant of bipolar 1 disorder, depression, tobacco use disorder, and more presents the ED with a chief complaint of bright red blood per rectum.  Patient reports that she has been in her normal state of health recently, and then today at 11:30 PM she had what she thought was incontinence of bowel and was actually blood running down her leg from her rectum.  Patient reports that she has had almost constant bloody bowel movements since then.  She had several at home, she had several more in the ED.  Patient reports that she has had no abdominal pain.  She has been more constipated recently than normal.  Patient reports that in the past she had urinary retention and constipation, but since then she has been using a probiotic and has not had issues with constipation.  3 days ago she started a new probiotic, which has not worked as well, so she has been constipated.  She does not remember the name of the new probiotic.  Patient reports that she has had some lightheadedness since all this started.  She denies chest pain, fever.  She reports she may have had some straining to get urinary flow earlier in the day, but no dysuria.  Her bladder had a fullness feeling to it, but nothing that she even took note of.  Patient is not on any blood thinners.  She has been using ibuprofen more regularly than normal due to headaches from allergies.  She reports she only takes 200 mg at a time and does not even use it every day. Patient is a current smoker half a pack per day.  She does not drink.  She does not use illicit drugs.  She is vaccinated for COVID and flu.  Patient is DNR.  Assessment and Plan: * Acute GI bleeding - Multiple grossly  bloody bowel movements in the ED - Hemoglobin is dropped approximately 3-4 g since admission. -Secondary to diverticular bleed -Status post colonoscopy with removal of 2 polyps and noticing extensive diverticulosis that at this moment and no actively bleeding. -There was still some blood seen in the colon lumen. -GI service recommending to advance diet and to continue to watch hemoglobin trend. -Hopefully able to discharge on 10/14/2022.   Acute blood loss anemia -Almost 4 g lost in the setting of diverticular bleed. -No transfusion currently required -Hemodynamically stable and expressing no symptoms. -Continue watching hemoglobin trend -Follow any further GI service recommendation.  Tobacco use disorder - Cessation counseling provided -Patient contemplating quitting. -Nicotine patch declined.  Clinical depression - Continue Zoloft -Mood stable.  Acute hypoxia -Patient with underlying history of COPD and tobacco abuse -No wheezing or complaining of shortness of breath but low oxygen saturation appreciated -Currently requiring 2 L nasal cannula supplementation. -Continue monitoring trend.  Subjective:  Afebrile, no chest pain, no nausea, no vomiting.  Tolerated colonoscopy well and denies acute complaints.  Still having some mild bright red blood per rectum.  Physical Exam: Vitals:   10/13/22 1000 10/13/22 1028 10/13/22 1029 10/13/22 1305  BP: 101/77 (!) 125/56  99/64  Pulse: 89 97  92  Resp: (!) 23 20    Temp:    98.1 F (36.7 C)  TempSrc:      SpO2: 97% (!) 82% 90% 94%  Weight:      Height:       General exam: Alert, awake, oriented x 3; no chest pain, no nausea, no vomiting.  Tolerated colonoscopy well and denies acute complaints.  Still having son bright red blood per rectum (but whole lot less). Respiratory system: No wheezing or crackles; 2 L nasal cannula supplementation in place (patient with chronic history of COPD and ongoing tobacco abuse.  Expressed no use of  oxygen at home. Cardiovascular system:RRR. No rubs or gallops; no JVD. Gastrointestinal system: Abdomen is nondistended, soft and nontender. No organomegaly or masses felt. Normal bowel sounds heard. Central nervous system: Alert and oriented. No focal neurological deficits. Extremities: No cyanosis, clubbing or edema. Skin: No petechiae. Psychiatry: Judgement and insight appear normal. Mood & affect appropriate.   Data Reviewed: CBC: WBCs 10.2, hemoglobin 10.0 and platelet count 245 K. -Magnesium: 2.1  Family Communication: No family at bedside.  Disposition: Status is: Inpatient Remains inpatient appropriate because: Status post colonoscopy evaluation; reporting still oozing some blood but hollowness.  Per GI will advance diet and if well-tolerated hopefully home on 10/14/2022.   Planned Discharge Destination: Home  Time spent: 35 minutes  Author: Barton Dubois, MD 10/13/2022 4:56 PM  For on call review [email or web address].

## 2022-10-13 NOTE — Brief Op Note (Signed)
10/12/2022 - 10/13/2022  9:41 AM  PATIENT:  Pamela Henson  72 y.o. female  PRE-OPERATIVE DIAGNOSIS:  rectal bleeding, symptomatic anemia  POST-OPERATIVE DIAGNOSIS:  cecal and decending colon polyp;diverticulosis;  PROCEDURE:  Procedure(s): COLONOSCOPY WITH PROPOFOL (N/A) ESOPHAGOGASTRODUODENOSCOPY (EGD) WITH PROPOFOL (N/A) POLYPECTOMY  SURGEON:  Surgeon(s) and Role:    * Harvel Quale, MD - Primary  Patient underwent colonoscopy under propofol sedation.  Tolerated the procedure adequately.    FINDINGS: - The examined portion of the ileum was normal.  - Two 4 to 5 mm polyps in the descending colon and in the cecum, removed with a cold snare.  Resected and retrieved.  - Blood in the rectum, in the sigmoid colon, in the descending colon and in the transverse colon.  - Diverticulosis in the sigmoid colon and in the descending colon.  - The distal rectum and anal verge are normal on retroflexion view.   Note: bleeding likely due to diverticular bleeding, which has subsided on its own  RECOMMENDATIONS - Return patient to hospital ward for ongoing care.  - Soft diet.  - Await pathology results.  - Repeat colonoscopy for surveillance based on pathology results.  - No high dose aspirin (Goody/BC powders), ibuprofen, naproxen, or other non-steroidal anti-inflammatory drugs.   Maylon Peppers, MD Gastroenterology and Hepatology [facility]

## 2022-10-13 NOTE — Anesthesia Postprocedure Evaluation (Signed)
Anesthesia Post Note  Patient: Pamela Henson  Procedure(s) Performed: COLONOSCOPY WITH PROPOFOL ESOPHAGOGASTRODUODENOSCOPY (EGD) WITH PROPOFOL POLYPECTOMY  Patient location during evaluation: PACU Anesthesia Type: General Level of consciousness: awake and alert Pain management: pain level controlled Vital Signs Assessment: post-procedure vital signs reviewed and stable Respiratory status: spontaneous breathing, nonlabored ventilation, respiratory function stable and patient connected to nasal cannula oxygen Cardiovascular status: blood pressure returned to baseline and stable Postop Assessment: no apparent nausea or vomiting Anesthetic complications: no   No notable events documented.   Last Vitals:  Vitals:   10/13/22 0900 10/13/22 0945  BP:  (!) 100/54  Pulse:  97  Resp:  (!) 9  Temp:    SpO2: 94% 93%    Last Pain:  Vitals:   10/13/22 0859  TempSrc: Oral  PainSc: 0-No pain                 Louann Sjogren

## 2022-10-13 NOTE — Progress Notes (Signed)
Patient has continued to have watery stools during the night while drinking bowel prep. The stool has  become lighter but not completely clear. Patient had one episode of vomiting. Two tap water enemas administered this am. Patient tolerated fair.

## 2022-10-13 NOTE — Progress Notes (Signed)
We will proceed with colonoscopy and possible EGD as scheduled.  I thoroughly discussed with the patient the procedure, including the risks involved. Patient understands what the procedure involves including the benefits and any risks. Patient understands alternatives to the proposed procedure. Risks including (but not limited to) bleeding, tearing of the lining (perforation), rupture of adjacent organs, problems with heart and lung function, infection, and medication reactions. A small percentage of complications may require surgery, hospitalization, repeat endoscopic procedure, and/or transfusion.  Patient understood and agreed.  Maylon Peppers, MD Gastroenterology and Hepatology [facility]

## 2022-10-13 NOTE — Progress Notes (Addendum)
Patient arrived back from colonoscopy, alert and oriented. On room air after patient reported taking nasal cannula out, O2 82%, placed back on 2L and O2 sat increased to 90%, other VSS. Dr. Dyann Kief and Dr. Jenetta Downer notified for diet order as patient is currently NPO, awaiting orders. Visitor at bedside, call light in reach.   1038: soft diet ordered by Dr. Jenetta Downer, good and drink given to patient and educated on soft diet. Verbalized understanding.

## 2022-10-13 NOTE — Anesthesia Preprocedure Evaluation (Signed)
Anesthesia Evaluation  Patient identified by MRN, date of birth, ID band Patient awake    Reviewed: Allergy & Precautions, H&P , NPO status , Patient's Chart, lab work & pertinent test results, reviewed documented beta blocker date and time   Airway Mallampati: II  TM Distance: >3 FB Neck ROM: full    Dental no notable dental hx.    Pulmonary neg pulmonary ROS, former smoker   Pulmonary exam normal breath sounds clear to auscultation       Cardiovascular Exercise Tolerance: Good negative cardio ROS  Rhythm:regular Rate:Normal     Neuro/Psych  PSYCHIATRIC DISORDERS  Depression Bipolar Disorder   negative neurological ROS  negative psych ROS   GI/Hepatic negative GI ROS, Neg liver ROS,,,  Endo/Other  negative endocrine ROS    Renal/GU negative Renal ROS  negative genitourinary   Musculoskeletal   Abdominal   Peds  Hematology negative hematology ROS (+) Blood dyscrasia, anemia   Anesthesia Other Findings   Reproductive/Obstetrics negative OB ROS                             Anesthesia Physical Anesthesia Plan  ASA: 4 and emergent  Anesthesia Plan: General   Post-op Pain Management:    Induction:   PONV Risk Score and Plan: Propofol infusion  Airway Management Planned:   Additional Equipment:   Intra-op Plan:   Post-operative Plan:   Informed Consent: I have reviewed the patients History and Physical, chart, labs and discussed the procedure including the risks, benefits and alternatives for the proposed anesthesia with the patient or authorized representative who has indicated his/her understanding and acceptance.     Dental Advisory Given  Plan Discussed with: CRNA  Anesthesia Plan Comments:        Anesthesia Quick Evaluation

## 2022-10-14 ENCOUNTER — Telehealth (INDEPENDENT_AMBULATORY_CARE_PROVIDER_SITE_OTHER): Payer: Self-pay | Admitting: Gastroenterology

## 2022-10-14 DIAGNOSIS — R0902 Hypoxemia: Secondary | ICD-10-CM

## 2022-10-14 DIAGNOSIS — K5791 Diverticulosis of intestine, part unspecified, without perforation or abscess with bleeding: Secondary | ICD-10-CM | POA: Diagnosis not present

## 2022-10-14 DIAGNOSIS — K5900 Constipation, unspecified: Secondary | ICD-10-CM

## 2022-10-14 DIAGNOSIS — F17200 Nicotine dependence, unspecified, uncomplicated: Secondary | ICD-10-CM | POA: Diagnosis not present

## 2022-10-14 DIAGNOSIS — K922 Gastrointestinal hemorrhage, unspecified: Secondary | ICD-10-CM | POA: Diagnosis not present

## 2022-10-14 DIAGNOSIS — F32A Depression, unspecified: Secondary | ICD-10-CM | POA: Diagnosis not present

## 2022-10-14 DIAGNOSIS — D62 Acute posthemorrhagic anemia: Secondary | ICD-10-CM | POA: Diagnosis not present

## 2022-10-14 LAB — BASIC METABOLIC PANEL
Anion gap: 6 (ref 5–15)
BUN: 12 mg/dL (ref 8–23)
CO2: 28 mmol/L (ref 22–32)
Calcium: 8.1 mg/dL — ABNORMAL LOW (ref 8.9–10.3)
Chloride: 108 mmol/L (ref 98–111)
Creatinine, Ser: 0.44 mg/dL (ref 0.44–1.00)
GFR, Estimated: >60 mL/min (ref >60)
Glucose, Bld: 101 mg/dL — ABNORMAL HIGH (ref 70–99)
Potassium: 3.9 mmol/L (ref 3.5–5.1)
Sodium: 142 mmol/L (ref 135–145)

## 2022-10-14 LAB — CBC
HCT: 29 % — ABNORMAL LOW (ref 36.0–46.0)
Hemoglobin: 9.2 g/dL — ABNORMAL LOW (ref 12.0–15.0)
MCH: 29.5 pg (ref 26.0–34.0)
MCHC: 31.7 g/dL (ref 30.0–36.0)
MCV: 92.9 fL (ref 80.0–100.0)
Platelets: 228 10*3/uL (ref 150–400)
RBC: 3.12 MIL/uL — ABNORMAL LOW (ref 3.87–5.11)
RDW: 13.2 % (ref 11.5–15.5)
WBC: 8.9 10*3/uL (ref 4.0–10.5)
nRBC: 0 % (ref 0.0–0.2)

## 2022-10-14 MED ORDER — DOCUSATE SODIUM 100 MG PO CAPS: 100.0000 mg | ORAL_CAPSULE | Freq: Two times a day (BID) | ORAL | 1 refills | Status: DC

## 2022-10-14 MED ORDER — POLYETHYLENE GLYCOL 3350 17 G PO PACK: 17.0000 g | PACK | Freq: Every day | ORAL | 0 refills | Status: DC

## 2022-10-14 NOTE — Discharge Summary (Signed)
Physician Discharge Summary   Patient: Pamela Henson MRN: [id number] DOB: 22-Feb-1951  Admit date:     10/12/2022  Discharge date: 10/14/22  Discharge Physician: Barton Dubois   PCP: Birdie Sons, MD   Recommendations at discharge:  Repeat CBC to follow hemoglobin trend Repeat basic metabolic panel to follow electrolytes and renal function Continue assisting patient with a smoking cessation Arrange outpatient follow-up with pulmonology for PFTs and further decision management on maintenance of most likely COPD. Reassess the need for oxygen supplementation and continue to wean off as tolerated  Discharge Diagnoses: Principal Problem:   Acute GI bleeding Active Problems:   Clinical depression   Tobacco use disorder   Acute blood loss anemia   Constipation   Hypoxia  Resolved Problems:   GIB (gastrointestinal bleeding)  Hospital Course: As per H&P written by Dr. Clearence Ped on 10/12/22  Pamela Henson is a 72 y.o. female with medical history significant of bipolar 1 disorder, depression, tobacco use disorder, and more presents the ED with a chief complaint of bright red blood per rectum.  Patient reports that she has been in her normal state of health recently, and then today at 11:30 PM she had what she thought was incontinence of bowel and was actually blood running down her leg from her rectum.  Patient reports that she has had almost constant bloody bowel movements since then.  She had several at home, she had several more in the ED.  Patient reports that she has had no abdominal pain.  She has been more constipated recently than normal.  Patient reports that in the past she had urinary retention and constipation, but since then she has been using a probiotic and has not had issues with constipation.  3 days ago she started a new probiotic, which has not worked as well, so she has been constipated.  She does not remember the name of the new probiotic.  Patient reports that she  has had some lightheadedness since all this started.  She denies chest pain, fever.  She reports she may have had some straining to get urinary flow earlier in the day, but no dysuria.  Her bladder had a fullness feeling to it, but nothing that she even took note of.  Patient is not on any blood thinners.  She has been using ibuprofen more regularly than normal due to headaches from allergies.  She reports she only takes 200 mg at a time and does not even use it every day. Patient is a current smoker half a pack per day.  She does not drink.  She does not use illicit drugs.  She is vaccinated for COVID and flu.  Patient is DNR.  Assessment and Plan: * Acute GI bleeding - Multiple grossly bloody bowel movements in the ED - Hemoglobin is dropped approximately 3-4 g since admission. -Secondary to diverticular bleed; at this moment self-limited. -Status post colonoscopy with removal of 2 polyps and noticing extensive diverticulosis that at this moment was not actively bleeding. -There was still some blood seen in the colon lumen. -GI service recommended to advance diet and assess for tolerance; patient tolerated diet without problems and felt stable to go home. -Repeat CBC at follow-up visit to assess hemoglobin and stability.  Hypoxia -Most likely with underlying history of COPD (currently not diagnosed). -Smoking cessation counseling provided -Following desaturation screening results patient has been discharged home on 2L of oxygen supplementation. -She will benefit of outpatient follow-up with pulmonology for PFTs and  further decision management of most likely underlying COPD process.  Constipation -Patient instructed to maintain adequate hydration, to increase fiber intake and to use MiraLAX/Colace to assist with constipation.  Acute blood loss anemia -Almost 4 g lost in the setting of diverticular bleed. -No transfusion required -Hemodynamically stable and expressing no chest pain,  dizziness, lightheadedness or any other symptoms of concerns. -Hemoglobin at discharge 9.2.  Tobacco use disorder -Cessation counseling provided -Patient contemplating quitting. -Nicotine patch declined.  Clinical depression - Continue Zoloft -Mood stable.   Consultants: Gastroenterology service Procedures performed: Colonoscopy; see below for x-ray report. Disposition: Home Diet recommendation: Heart healthy diet.  DISCHARGE MEDICATION: Allergies as of 10/14/2022   No Known Allergies      Medication List     STOP taking these medications    ibuprofen 200 MG tablet Commonly known as: ADVIL       TAKE these medications    docusate sodium 100 MG capsule Commonly known as: Colace Take 1 capsule (100 mg total) by mouth 2 (two) times daily.   gabapentin 100 MG capsule Commonly known as: NEURONTIN Take 100 mg by mouth 3 (three) times daily.   polyethylene glycol 17 g packet Commonly known as: MiraLax Take 17 g by mouth daily.   sertraline 100 MG tablet Commonly known as: ZOLOFT Take 50 mg by mouth daily.   tamsulosin 0.4 MG Caps capsule Commonly known as: FLOMAX Take 1 capsule (0.4 mg total) by mouth daily. What changed: when to take this               Durable Medical Equipment  (From admission, onward)           Start     Ordered   10/14/22 1103  For home use only DME oxygen  Once       Question Answer Comment  Length of Need 12 Months   Mode or (Route) Nasal cannula   Liters per Minute 2   Frequency Continuous (stationary and portable oxygen unit needed)   Oxygen conserving device Yes   Oxygen delivery system Gas      10/14/22 1102            Follow-up Information     Fisher, Kirstie Peri, MD. Schedule an appointment as soon as possible for a visit in 10 day(s).   Specialty: Family Medicine Contact information: [address] [place] [address] [phone]                Discharge Exam: Danley Danker Weights    10/12/22 0102 10/12/22 1848  Weight: 58 kg 57.7 kg   General exam: Alert, awake, oriented x 3; 9 chest pain, palpitations, dizziness and lightheadedness.  No further significant overt bleeding.  Demonstrating desaturation on room air and requiring 2 L oxygen supplementation through nasal cannula to maintain O2 sats above 90%. Respiratory system: No wheezing, no crackles, no using accessory muscles. Cardiovascular system:RRR. No rubs or gallops, no JVD. Gastrointestinal system: Abdomen is nondistended, soft and nontender. No organomegaly or masses felt. Normal bowel sounds heard. Central nervous system: Alert and oriented. No focal neurological deficits. Extremities: No cyanosis or clubbing. Skin: No petechiae. Psychiatry: Judgement and insight appear normal. Mood & affect appropriate.    Condition at discharge: Stable and improved.  The results of significant diagnostics from this hospitalization (including imaging, microbiology, ancillary and laboratory) are listed below for reference.   Imaging Studies: DG Chest Port 1 View  Result Date: 10/12/2022 CLINICAL DATA:  Hypoxia EXAM: PORTABLE  CHEST 1 VIEW COMPARISON:  None Available. FINDINGS: Cardiac shadow is within normal limits. The lungs are hyperinflated. Aortic calcifications are noted. No focal infiltrate or effusion is seen. Somewhat nodular density is noted in the left mid lung overlying the anterior aspect of the third rib. This may be related to confluence of parenchymal shadows although the possibility of underlying nodule deserves consideration. IMPRESSION: Nodular changes in the left mid lung. Noncontrast CT is recommended for further evaluation. Electronically Signed   By: Inez Catalina M.D.   On: 10/12/2022 01:59    Microbiology: Results for orders placed or performed during the hospital encounter of 05/27/16  Wet prep, genital     Status: None   Collection Time: 05/27/16  3:07 PM   Specimen: Vaginal; Genital  Result Value  Ref Range Status   Yeast Wet Prep HPF POC NONE SEEN NONE SEEN Final   Trich, Wet Prep NONE SEEN NONE SEEN Final   Clue Cells Wet Prep HPF POC NONE SEEN NONE SEEN Final   WBC, Wet Prep HPF POC NONE SEEN NONE SEEN Final   Sperm NONE SEEN  Final    Labs: CBC: Recent Labs  Lab 10/12/22 0106 10/12/22 0305 10/12/22 1100 10/12/22 1500 10/12/22 1906 10/12/22 2256 10/14/22 0458  WBC 9.7   < > 10.8* 12.7* 10.6* 10.2 8.9  NEUTROABS 4.2  --   --   --   --   --   --   HGB 14.7   < > 11.4* 11.7* 11.3* 10.0* 9.2*  HCT 43.5   < > 36.6 36.9 35.7* 32.0* 29.0*  MCV 91.4   < > 92.7 92.3 93.2 93.8 92.9  PLT 341   < > 266 302 278 245 228   < > = values in this interval not displayed.   Basic Metabolic Panel: Recent Labs  Lab 10/12/22 0106 10/12/22 0305 10/14/22 0458  NA 138 139 142  K 3.7 3.8 3.9  CL 106 108 108  CO2 '22 25 28  '$ GLUCOSE 117* 127* 101*  BUN '20 19 12  '$ CREATININE 0.62 0.51 0.44  CALCIUM 8.9 8.5* 8.1*  MG  --  2.1  --    Liver Function Tests: Recent Labs  Lab 10/12/22 0305  AST 22  ALT 12  ALKPHOS 92  BILITOT 0.6  PROT 6.2*  ALBUMIN 3.2*   CBG: No results for input(s): "GLUCAP" in the last 168 hours.  Discharge time spent: greater than 30 minutes.  Signed: Barton Dubois, MD [facility] 10/14/2022

## 2022-10-14 NOTE — Assessment & Plan Note (Signed)
-  Most likely with underlying history of COPD (currently not diagnosed). -Smoking cessation counseling provided -Following desaturation screening results patient has been discharged home on 2L of oxygen supplementation. -She will benefit of outpatient follow-up with pulmonology for PFTs and further decision management of most likely underlying COPD process.

## 2022-10-14 NOTE — Assessment & Plan Note (Signed)
-  Patient instructed to maintain adequate hydration, to increase fiber intake and to use MiraLAX/Colace to assist with constipation.

## 2022-10-14 NOTE — Progress Notes (Signed)
Pamela Henson, M.D. Gastroenterology & Hepatology   Interval History:  No acute events overnight. Patient states that she had a small bowel movement yesterday with some blood but not as severe as she had the blood before. No nausea, vomiting, fever, chills, hematochezia or melena.  Tolerated soft diet. Hemoglobin decreased to 9.2.  Inpatient Medications:  Current Facility-Administered Medications:    0.9 %  sodium chloride infusion, , Intravenous, Continuous, Mahon, Courtney L, NP, Last Rate: 20 mL/hr at 10/13/22 1506, Infusion Verify at 10/13/22 1506   acetaminophen (TYLENOL) tablet 650 mg, 650 mg, Oral, Q6H PRN **OR** acetaminophen (TYLENOL) suppository 650 mg, 650 mg, Rectal, Q6H PRN, Zierle-Ghosh, Asia B, DO   gabapentin (NEURONTIN) capsule 100 mg, 100 mg, Oral, TID, Barton Dubois, MD, 100 mg at 10/14/22 0854   ondansetron (ZOFRAN) tablet 4 mg, 4 mg, Oral, Q6H PRN **OR** ondansetron (ZOFRAN) injection 4 mg, 4 mg, Intravenous, Q6H PRN, Zierle-Ghosh, Asia B, DO, 4 mg at 10/12/22 2103   oxyCODONE (Oxy IR/ROXICODONE) immediate release tablet 5 mg, 5 mg, Oral, Q4H PRN, Zierle-Ghosh, Asia B, DO   sertraline (ZOLOFT) tablet 50 mg, 50 mg, Oral, Daily, Barton Dubois, MD, 50 mg at 10/14/22 0854   tamsulosin (FLOMAX) capsule 0.4 mg, 0.4 mg, Oral, QHS, Zierle-Ghosh, Asia B, DO, 0.4 mg at 10/13/22 2234   I/O    Intake/Output Summary (Last 24 hours) at 10/14/2022 0928 Last data filed at 10/14/2022 0500 Gross per 24 hour  Intake 1385.33 ml  Output --  Net 1385.33 ml     Physical Exam: Temp:  [97.7 F (36.5 C)-98.3 F (36.8 C)] 98.3 F (36.8 C) (01/21 0550) Pulse Rate:  [75-97] 83 (01/21 0550) Resp:  [9-23] 16 (01/21 0550) BP: (99-125)/(50-77) 104/50 (01/21 0550) SpO2:  [82 %-97 %] 95 % (01/21 0550)  Temp (24hrs), Avg:98 F (36.7 C), Min:97.7 F (36.5 C), Max:98.3 F (36.8 C) GENERAL: The patient is AO x3, in no acute distress. HEENT: Head is normocephalic and atraumatic. EOMI are  intact. Mouth is well hydrated and without lesions. NECK: Supple. No masses LUNGS: Clear to auscultation. No presence of rhonchi/wheezing/rales. Adequate chest expansion HEART: RRR, normal s1 and s2. ABDOMEN: Soft, nontender, no guarding, no peritoneal signs, and nondistended. BS +. No masses. EXTREMITIES: Without any cyanosis, clubbing, rash, lesions or edema. NEUROLOGIC: AOx3, no focal motor deficit. SKIN: no jaundice, no rashes  Laboratory Data: CBC:     Component Value Date/Time   WBC 8.9 10/14/2022 0458   RBC 3.12 (L) 10/14/2022 0458   HGB 9.2 (L) 10/14/2022 0458   HCT 29.0 (L) 10/14/2022 0458   PLT 228 10/14/2022 0458   MCV 92.9 10/14/2022 0458   MCH 29.5 10/14/2022 0458   MCHC 31.7 10/14/2022 0458   RDW 13.2 10/14/2022 0458   LYMPHSABS 4.3 (H) 10/12/2022 0106   MONOABS 0.9 10/12/2022 0106   EOSABS 0.2 10/12/2022 0106   BASOSABS 0.1 10/12/2022 0106   COAG:  Lab Results  Component Value Date   INR 0.9 10/12/2022    BMP:     Latest Ref Rng & Units 10/14/2022    4:58 AM 10/12/2022    3:05 AM 10/12/2022    1:06 AM  BMP  Glucose 70 - 99 mg/dL 101  127  117   BUN 8 - 23 mg/dL '12  19  20   '$ Creatinine 0.44 - 1.00 mg/dL 0.44  0.51  0.62   Sodium 135 - 145 mmol/L 142  139  138   Potassium 3.5 - 5.1 mmol/L  3.9  3.8  3.7   Chloride 98 - 111 mmol/L 108  108  106   CO2 22 - 32 mmol/L '28  25  22   '$ Calcium 8.9 - 10.3 mg/dL 8.1  8.5  8.9     HEPATIC:     Latest Ref Rng & Units 10/12/2022    3:05 AM  Hepatic Function  Total Protein 6.5 - 8.1 g/dL 6.2   Albumin 3.5 - 5.0 g/dL 3.2   AST 15 - 41 U/L 22   ALT 0 - 44 U/L 12   Alk Phosphatase 38 - 126 U/L 92   Total Bilirubin 0.3 - 1.2 mg/dL 0.6     CARDIAC: No results found for: "CKTOTAL", "CKMB", "CKMBINDEX", "TROPONINI"    Imaging: I personally reviewed and interpreted the available labs, imaging and endoscopic files.   Assessment/Plan: 72 y/o F with PMH bipolar 1 disorder, depression, who came to the hospital after  presenting recurrent episodes of rectal bleeding.  Patient was admitted with a concern for lower gastrointestinal bleeding in the setting of ongoing NSAID and Goody powder use.  Given the drop in her hemoglobin from admission, a colonoscopy was performed yesterday.  There was presence of clots and old blood in the colon, mostly located in the left side of the colon but sparing the cecum and ascending colon, no presence of blood was seen in the terminal ileum, had a couple of small polyps in the descending colon and cecum which were removed.  Due to these findings, it was considered that she had bleeding from diverticular bleeding without any active site identified.  Patient has remained hemodynamically stable with very mild drop of her hemoglobin, I consider this is related to her previous losses during the acute event but clinically she does not seem to be actively bleeding.  I advised her to avoid taking NSAIDs or high-dose aspirin to avoid any further events.  -Continue GI soft/high-fiber diet -Avoid NSAIDs or high-dose aspirin -Follow pathology report -Check hemoglobin in 1 to 2 weeks - Patient will follow up in GI clinic with Dr. Jenetta Downer in 3-4 weeks.  Pamela Peppers, MD Gastroenterology and Hepatology [facility]

## 2022-10-14 NOTE — Telephone Encounter (Signed)
Hi Mitzie,  Can you please schedule a follow up appointment for this patient in 3-4 weeks with me or any of the APPs?  Thanks,  Oscar Forman Castaneda, MD Gastroenterology and Hepatology [facility]  

## 2022-10-14 NOTE — Progress Notes (Signed)
Patient requesting to take a shower. IV wrapped to protect from becoming saturated. Patient oxygen saturation with 2L was 93%. Patient's oxygen removed to ensure patient was medically able to endure a shower or being on room air, oxygen saturation was 92%. Patient water initiated. All supplied, clean socks, clean gown, and assistance provided to disrobe. While in shower patient did have a BM with a scant amount of dark blood and small clots. Patient bedding changed. Patient completed shower and HR was 112 and Oxygen was 89% on RA. Patient asked to sit in bed and relax after showering and brushing her teeth. 2L of oxygen replaced, oxygen saturation 94%. Patient HR now 101. Patient did become nauseated after feeling weak and fatigued after completing her shower but did not vomit. Patient reassured and sprite given. No further issues or needs verbalized. Will continue to monitor.

## 2022-10-14 NOTE — TOC Transition Note (Signed)
Transition of Care [facility]) - CM/SW Discharge Note   Patient Details  Name: Pamela Henson MRN: [id number] Date of Birth: 07-Apr-1951  [facility] CM/SW Contact:  Iona Beard, [place] Phone Number: 10/14/2022, 12:10 PM   Clinical Narrative:    CSW updated that pt will need home O2 set up prior to D/C. Sat qual and orders have been placed. CSW spoke with pt and she does not have a DME agency preference. Caryl Pina with [facility] accepts referral. Tank to be delivered to pt in room, [employer] will go to pts home for home set up. TOC signing off.   Final next level of care: Home/Self Care Barriers to Discharge: Barriers Resolved   Patient Goals and CMS Choice CMS Medicare.gov Compare Post Acute Care list provided to:: Patient Choice offered to / list presented to : Patient  Discharge Placement                         Discharge Plan and Services Additional resources added to the After Visit Summary for                  DME Arranged: Oxygen DME Agency: [facility] Date DME Agency Contacted: 10/14/22   Representative spoke with at DME Agency: Caryl Pina            Social Determinants of Health ([place]) Interventions SDOH Screenings   Food Insecurity: No Food Insecurity (10/13/2022)  Housing: Low Risk  (10/13/2022)  Transportation Needs: No Transportation Needs (10/13/2022)  Utilities: Not At Risk (10/13/2022)  Tobacco Use: Medium Risk (10/13/2022)     Readmission Risk Interventions     No data to display

## 2022-10-14 NOTE — Progress Notes (Signed)
SATURATION QUALIFICATIONS: (This note is used to comply with regulatory documentation for home oxygen)  Patient Saturations on Room Air at Rest = 92%  Patient Saturations on Room Air while Ambulating = 84%  Patient Saturations on 2 Liters of oxygen while Ambulating = 94%  Please briefly explain why patient needs home oxygen: SOB with exertion

## 2022-10-16 ENCOUNTER — Encounter (HOSPITAL_COMMUNITY): Payer: Self-pay | Admitting: Gastroenterology

## 2022-10-16 DIAGNOSIS — F410 Panic disorder [episodic paroxysmal anxiety] without agoraphobia: Secondary | ICD-10-CM | POA: Diagnosis not present

## 2022-10-16 DIAGNOSIS — F331 Major depressive disorder, recurrent, moderate: Secondary | ICD-10-CM | POA: Diagnosis not present

## 2022-10-16 DIAGNOSIS — F411 Generalized anxiety disorder: Secondary | ICD-10-CM | POA: Diagnosis not present

## 2022-10-16 LAB — SURGICAL PATHOLOGY

## 2022-10-17 ENCOUNTER — Ambulatory Visit (INDEPENDENT_AMBULATORY_CARE_PROVIDER_SITE_OTHER): Payer: Medicare Other | Admitting: Family Medicine

## 2022-10-17 ENCOUNTER — Encounter: Payer: Self-pay | Admitting: Family Medicine

## 2022-10-17 ENCOUNTER — Telehealth: Payer: Self-pay | Admitting: *Deleted

## 2022-10-17 VITALS — BP 101/62 | HR 79 | Temp 98.4°F | Resp 16 | Ht 65.0 in | Wt 128.1 lb

## 2022-10-17 DIAGNOSIS — K922 Gastrointestinal hemorrhage, unspecified: Secondary | ICD-10-CM

## 2022-10-17 DIAGNOSIS — Z86010 Personal history of colonic polyps: Secondary | ICD-10-CM

## 2022-10-17 DIAGNOSIS — R911 Solitary pulmonary nodule: Secondary | ICD-10-CM | POA: Diagnosis not present

## 2022-10-17 DIAGNOSIS — F3170 Bipolar disorder, currently in remission, most recent episode unspecified: Secondary | ICD-10-CM

## 2022-10-17 DIAGNOSIS — R0902 Hypoxemia: Secondary | ICD-10-CM

## 2022-10-17 DIAGNOSIS — F17200 Nicotine dependence, unspecified, uncomplicated: Secondary | ICD-10-CM

## 2022-10-17 NOTE — Patient Outreach (Signed)
  Care Coordination [facility] Note Transition Care Management Follow-up Telephone Call Date of discharge and from where: [id number] [facility] Bleed How have you been since you were released from the hospital? Improving a little each day. I am to wear my oxygen at night and when moving around Any questions or concerns? Yes When [employer] came and delivered the O2 tank they said if she wanted the smaller tank she needed to get an order. Do you think I should get it. RN discussed with patient her activities and can she lift the tank  when she has to put in the car. Patient stated she does have a bad back.   Items Reviewed: Did the pt receive and understand the discharge instructions provided?  Medications obtained and verified? Yes  Other? No  Any new allergies since your discharge? No  Dietary orders reviewed? No Do you have support at home? Yes   Home Care and Equipment/Supplies: Were home health services ordered? no If so, what is the name of the agency? N/a  Has the agency set up a time to come to the patient's home? not applicable Were any new equipment or medical supplies ordered?  No What is the name of the medical supply agency? N/a Were you able to get the supplies/equipment? not applicable Do you have any questions related to the use of the equipment or supplies? No  Functional Questionnaire: (I = Independent and D = Dependent) ADLs: D  Bathing/Dressing- I  Meal Prep- I  Eating- I  Maintaining continence- I  Transferring/Ambulation-D  Managing Meds- I  Follow up appointments reviewed:  PCP Hospital f/u appt confirmed? Yes  Scheduled to see Dr Caryn Section [phone] 8:00 [facility] f/u appt confirmed? Yes  Scheduled to see Roseanne Kaufman NP Gertie Fey [phone] 3:00. Are transportation arrangements needed? No  If their condition worsens, is the pt aware to call PCP or go to the Emergency Dept.? Yes Was the patient provided with contact information for the PCP's office or ED?  Yes Was to pt encouraged to call back with questions or concerns? Yes  SDOH assessments and interventions completed:   Yes SDOH Interventions Today    Flowsheet Row Most Recent Value  SDOH Interventions   Food Insecurity Interventions Intervention Not Indicated  Housing Interventions Intervention Not Indicated  Transportation Interventions Intervention Not Indicated       Care Coordination Interventions:  RN discussed activities and mobility with the different types of oxygen tanks.     Encounter Outcome:  Pt. Visit Completed    [facility] [phone]

## 2022-10-17 NOTE — Progress Notes (Signed)
I,Sulibeya S Dimas,acting as a scribe for Lelon Huh, MD.,have documented all relevant documentation on the behalf of Lelon Huh, MD,as directed by  Lelon Huh, MD while in the presence of Lelon Huh, MD.     Established patient visit   Patient: Pamela Henson   DOB: 02-21-1951   72 y.o. Female  MRN: [id number] Visit Date: 10/17/2022  Today's healthcare provider: Lelon Huh, MD   Chief Complaint  Patient presents with   Hospitalization Follow-up   Subjective    HPI  Follow up Hospitalization  Patient was admitted to [facility] on 10/12/22 and discharged on 10/14/22. She was treated for acute GI bleed. Colonoscopy revealed 2 polyps, one of which was an adenoma, and extensive non-bleeding diverticulosis. Did not require blood transfusion. Is not on iron supplements. .   Telephone follow up was done on  She reports excellent compliance with treatment. She reports this condition is improved. She did have small amount of blood in stool 2 days ago, but none since.   Was also started on 2 liters oxygen while in the hospital. She hs home oximetry and reports O2 is typically in the low 90s, but drops to 89 when getting in the shower. No known history of lung disease but she is along time smoker and reports she has not smoked at all since hospitalization. Chest xray did show some nodular changes in the left lung.   ----------------------------------------------------------------------------------------- -   Medications: Outpatient Medications Prior to Visit  Medication Sig   docusate sodium (COLACE) 100 MG capsule Take 1 capsule (100 mg total) by mouth 2 (two) times daily.   gabapentin (NEURONTIN) 100 MG capsule Take 100 mg by mouth 3 (three) times daily.   polyethylene glycol (MIRALAX) 17 g packet Take 17 g by mouth daily.   sertraline (ZOLOFT) 50 MG tablet Take 50 mg by mouth daily.   tamsulosin (FLOMAX) 0.4 MG CAPS capsule Take 1 capsule (0.4 mg total) by mouth daily.  (Patient taking differently: Take 0.4 mg by mouth at bedtime.)   [DISCONTINUED] sertraline (ZOLOFT) 100 MG tablet Take 50 mg by mouth daily.   No facility-administered medications prior to visit.    Review of Systems  Constitutional:  Positive for fatigue. Negative for appetite change, chills and fever.  Respiratory:  Positive for shortness of breath. Negative for cough, chest tightness and wheezing.   Cardiovascular:  Negative for chest pain and leg swelling.  Gastrointestinal:  Negative for abdominal pain, blood in stool, constipation, diarrhea, nausea and vomiting.    Last CBC Lab Results  Component Value Date   WBC 8.9 10/14/2022   HGB 9.2 (L) 10/14/2022   HCT 29.0 (L) 10/14/2022   MCV 92.9 10/14/2022   MCH 29.5 10/14/2022   RDW 13.2 10/14/2022   PLT 228 68/[birth year]/1962   Last metabolic panel Lab Results  Component Value Date   GLUCOSE 101 (H) 10/14/2022   NA 142 10/14/2022   K 3.9 10/14/2022   CL 108 10/14/2022   CO2 28 10/14/2022   BUN 12 10/14/2022   CREATININE 0.44 10/14/2022   GFRNONAA >60 10/14/2022   CALCIUM 8.1 (L) 10/14/2022   PROT 6.2 (L) 10/12/2022   ALBUMIN 3.2 (L) 10/12/2022   BILITOT 0.6 10/12/2022   ALKPHOS 92 10/12/2022   AST 22 10/12/2022   ALT 12 10/12/2022   ANIONGAP 6 10/14/2022   Last lipids Lab Results  Component Value Date   CHOL 202 (H) 10/18/2015   HDL 57 10/18/2015   LDLCALC 119 (H) 10/18/2015  TRIG 128 10/18/2015   CHOLHDL 3.5 10/18/2015      Objective    BP 101/62 (BP Location: Left Arm, Patient Position: Sitting, Cuff Size: Normal)   Pulse 79   Temp 98.4 F (36.9 C) (Temporal)   Resp 16   Ht '5\' 5"'$  (1.651 m)   Wt 128 lb 1.6 oz (58.1 kg)   SpO2 97% Comment: 2L o2  BMI 21.32 kg/m  BP Readings from Last 3 Encounters:  10/17/22 101/62  10/14/22 (!) 104/50  01/01/22 (!) 88/62   Wt Readings from Last 3 Encounters:  10/17/22 128 lb 1.6 oz (58.1 kg)  10/12/22 127 lb 3.3 oz (57.7 kg)  01/01/22 126 lb 9.6 oz (57.4 kg)    Oximetry 97% resting on 2 lpm oxygen 93% resting on room air 89% walking on room air 94% walking on 2 lpm oxygen  Physical Exam   General: Appearance:    Well developed, well nourished female in no acute distress  Eyes:    PERRL, conjunctiva/corneas clear, EOM's intact       Lungs:     Clear to auscultation bilaterally, respirations unlabored  Heart:    Normal heart rate. Normal rhythm. No murmurs, rubs, or gallops.    MS:   All extremities are intact.    Neurologic:   Awake, alert, oriented x 3. No apparent focal neurological defect.          Assessment & Plan     1. Lower GI bleeding Resolved, likely secondary to adenomatous polyp or diverticulosis.  - CBC  2. Tobacco use disorder Has quit smoking since recent hospitalization.   3. Bipolar disorder in full remission, most recent episode unspecified type [facility]) Continue regular follow up with Pauline Good, PA in [place]  4. Abnormal x-ray of lungs with single pulmonary nodule  - CT CHEST NODULE FOLLOW UP LOW DOSE W/O; Future  5. Hypoxia Likely some underlying COPD. CT chest is pending. Has stopped smoking. Consider maintenance inhaler at follow up.   6. History of adenomatous polyp of colon Repeat colonoscopy 2030      The entirety of the information documented in the History of Present Illness, Review of Systems and Physical Exam were personally obtained by me. Portions of this information were initially documented by the CMA and reviewed by me for thoroughness and accuracy.     Lelon Huh, MD  [place] [phone] (phone) [phone] (fax)  [place]

## 2022-10-18 ENCOUNTER — Telehealth: Payer: Self-pay

## 2022-10-18 DIAGNOSIS — R911 Solitary pulmonary nodule: Secondary | ICD-10-CM

## 2022-10-18 LAB — CBC
Hematocrit: 31.4 % — ABNORMAL LOW (ref 34.0–46.6)
Hemoglobin: 10.4 g/dL — ABNORMAL LOW (ref 11.1–15.9)
MCH: 29.4 pg (ref 26.6–33.0)
MCHC: 33.1 g/dL (ref 31.5–35.7)
MCV: 89 fL (ref 79–97)
Platelets: 341 10*3/uL (ref 150–450)
RBC: 3.54 x10E6/uL — ABNORMAL LOW (ref 3.77–5.28)
RDW: 11.6 % — ABNORMAL LOW (ref 11.7–15.4)
WBC: 10.1 10*3/uL (ref 3.4–10.8)

## 2022-10-18 NOTE — Telephone Encounter (Signed)
Copied from [place] [id number]. Topic: General - Other >> Oct 18, 2022 12:16 PM Burman Freestone wrote: Reason for CRM: This is message form the CT department. We will need corrected order Good Morning, According to the Nodule Follow up Protocol this is the Indication from the Radiologist.." Follow Up malignant Lung Nodules or nodules in patients w/ HX of CA".  I can see this pt had a Portable CXR done on admission on 10/12/2022 and the Radiologist recommended a Non Contrast CT.  If the pt doesn't have a hx of Cancer then the Order would need to be CT Chest without contrast.  Thank you so much,  Colletta [place]

## 2022-10-26 ENCOUNTER — Ambulatory Visit
Admission: RE | Admit: 2022-10-26 | Discharge: 2022-10-26 | Disposition: A | Discharge status: Home / Self Care | Payer: Medicare Other | Source: Ambulatory Visit | Attending: Family Medicine | Admitting: Family Medicine

## 2022-10-26 DIAGNOSIS — J439 Emphysema, unspecified: Secondary | ICD-10-CM | POA: Diagnosis not present

## 2022-10-26 DIAGNOSIS — R911 Solitary pulmonary nodule: Secondary | ICD-10-CM | POA: Diagnosis not present

## 2022-10-26 DIAGNOSIS — R918 Other nonspecific abnormal finding of lung field: Secondary | ICD-10-CM | POA: Diagnosis not present

## 2022-11-13 ENCOUNTER — Inpatient Hospital Stay: Payer: Medicare Other | Admitting: Gastroenterology

## 2022-11-13 DIAGNOSIS — F331 Major depressive disorder, recurrent, moderate: Secondary | ICD-10-CM | POA: Diagnosis not present

## 2022-11-13 DIAGNOSIS — F410 Panic disorder [episodic paroxysmal anxiety] without agoraphobia: Secondary | ICD-10-CM | POA: Diagnosis not present

## 2022-11-13 DIAGNOSIS — F411 Generalized anxiety disorder: Secondary | ICD-10-CM | POA: Diagnosis not present

## 2022-11-14 DIAGNOSIS — R0902 Hypoxemia: Secondary | ICD-10-CM | POA: Diagnosis not present

## 2022-11-23 HISTORY — PX: PERIPHERAL VASCULAR THROMBECTOMY: CATH118306

## 2022-12-04 ENCOUNTER — Ambulatory Visit (INDEPENDENT_AMBULATORY_CARE_PROVIDER_SITE_OTHER): Payer: Medicare Other

## 2022-12-04 ENCOUNTER — Ambulatory Visit: Payer: Medicare Other | Admitting: Orthopedic Surgery

## 2022-12-04 ENCOUNTER — Encounter: Payer: Self-pay | Admitting: Orthopedic Surgery

## 2022-12-04 ENCOUNTER — Ambulatory Visit (HOSPITAL_COMMUNITY)
Admission: RE | Admit: 2022-12-04 | Discharge: 2022-12-04 | Disposition: A | Discharge status: Home / Self Care | Payer: Medicare Other | Source: Ambulatory Visit | Attending: Orthopedic Surgery | Admitting: Orthopedic Surgery

## 2022-12-04 ENCOUNTER — Other Ambulatory Visit: Payer: Self-pay | Admitting: Orthopedic Surgery

## 2022-12-04 DIAGNOSIS — M25561 Pain in right knee: Secondary | ICD-10-CM

## 2022-12-04 DIAGNOSIS — S82121A Displaced fracture of lateral condyle of right tibia, initial encounter for closed fracture: Secondary | ICD-10-CM

## 2022-12-04 DIAGNOSIS — R937 Abnormal findings on diagnostic imaging of other parts of musculoskeletal system: Secondary | ICD-10-CM | POA: Diagnosis not present

## 2022-12-04 DIAGNOSIS — S82141A Displaced bicondylar fracture of right tibia, initial encounter for closed fracture: Secondary | ICD-10-CM | POA: Diagnosis not present

## 2022-12-04 DIAGNOSIS — M25562 Pain in left knee: Secondary | ICD-10-CM

## 2022-12-04 NOTE — Progress Notes (Signed)
New Patient Visit  Assessment: Pamela Henson is a 72 y.o. female with the following: 1. Closed fracture of lateral portion of right tibial plateau, initial encounter    Plan: Pamela Henson fell earlier today, in her backyard.  She had immediate onset of right knee pain.  She was not evaluated prior to presenting to clinic.  Radiographs demonstrates a displaced lateral tibial plateau fracture, with possible medial tibial plateau fracture.  As such, I am recommending an urgent CT scan for further evaluation.  Pending the results of the CT scan, she will require surgery either by myself, or I will make an appropriate referral.  All questions were answered, and she is in agreement with this plan.  She was placed in a knee immobilizer.  She was advised to remain nonweightbearing.  Follow-up: Return for After CT Scan.  Subjective:  Chief Complaint  Patient presents with   Knee Pain    Right/ fell in yard today now can not bear weight on the right knee / leg    History of Present Illness: Pamela Henson is a 72 y.o. female who presents for evaluation of right knee pain.  She was doing some work in her backyard earlier today.  She was standing on a bucket or a small step.  She lost her balance.  She fell, twisted her knee.  She had immediate pain.  She is unable to bear weight.  She was able to get an urgent appointment in clinic today.     Review of Systems: No fevers or chills No numbness or tingling No chest pain No shortness of breath No bowel or bladder dysfunction No GI distress No headaches   Medical History:  Past Medical History:  Diagnosis Date   Bipolar 1 disorder ([place])    Bipolar 1 disorder ([place])    History of chicken pox    History of measles    History of mumps     Past Surgical History:  Procedure Laterality Date   ABDOMINAL HYSTERECTOMY  1979   age 57 due to vaginal bleeding   COLONOSCOPY N/A 10/30/2017   Procedure: COLONOSCOPY;  Surgeon: Rogene Houston, MD;  [facility]: [facility];  Service: Endoscopy;  Laterality: N/A;  1225   COLONOSCOPY WITH PROPOFOL N/A 10/13/2022   Procedure: COLONOSCOPY WITH PROPOFOL;  Surgeon: Harvel Quale, MD;  Location: [facility];  Service: Gastroenterology;  Laterality: N/A;   ESOPHAGOGASTRODUODENOSCOPY (EGD) WITH PROPOFOL N/A 10/13/2022   Procedure: ESOPHAGOGASTRODUODENOSCOPY (EGD) WITH PROPOFOL;  Surgeon: Harvel Quale, MD;  [facility];  Service: Gastroenterology;  Laterality: N/A;   POLYPECTOMY  10/13/2022   Procedure: POLYPECTOMY;  Surgeon: Montez Morita, Quillian Quince, MD;  [facility];  Service: Gastroenterology;;    Family History  Problem Relation Age of Onset   Stroke Mother 66   Stroke Father 81   Colon cancer Neg Hx    Social History   Tobacco Use   Smoking status: Former    Packs/day: 1.00    Years: 15.00    Total pack years: 15.00    Types: Cigarettes    Quit date: 09/24/1998    Years since quitting: 24.2   Smokeless tobacco: Never  Vaping Use   Vaping Use: Never used  Substance Use Topics   Alcohol use: No    Alcohol/week: 0.0 standard drinks of alcohol   Drug use: No    No Known Allergies  Current Meds  Medication Sig   docusate sodium (  COLACE) 100 MG capsule Take 1 capsule (100 mg total) by mouth 2 (two) times daily.   gabapentin (NEURONTIN) 100 MG capsule Take 100 mg by mouth 3 (three) times daily.   polyethylene glycol (MIRALAX) 17 g packet Take 17 g by mouth daily.   sertraline (ZOLOFT) 50 MG tablet Take 50 mg by mouth daily.   tamsulosin (FLOMAX) 0.4 MG CAPS capsule Take 1 capsule (0.4 mg total) by mouth daily. (Patient taking differently: Take 0.4 mg by mouth at bedtime.)    Objective: There were no vitals taken for this visit.  Physical Exam:  General: Alert and oriented. and No acute distress. Gait: Unable to ambulate.  Evaluation the right knee demonstrates diffuse swelling.  There is no bruising.  She has  tenderness to palpation along the lateral joint line.  Knee is held in a slightly flexed position.  Range of motion testing deferred.  Stability testing deferred.  Toes warm and well-perfused distally.  IMAGING: I personally ordered and reviewed the following images  X-rays of the right knee were obtained in clinic today.  There is a fracture of the lateral tibial plateau, with widening of the joint surface, as well as depression of fracture fragments.  There also appears to be a fracture through the medial tibial plateau, which is minimally displaced.  Comminuted fracture of the tibial spines.  No fracture of the patella.  No bony lesions.  Impression: Right tibial plateau fracture, with comminution and depression of the joint line.   New Medications:  No orders of the defined types were placed in this encounter.     Mordecai Rasmussen, MD  12/04/2022 11:03 PM

## 2022-12-05 ENCOUNTER — Other Ambulatory Visit: Payer: Self-pay | Admitting: Orthopedic Surgery

## 2022-12-05 DIAGNOSIS — S82121A Displaced fracture of lateral condyle of right tibia, initial encounter for closed fracture: Secondary | ICD-10-CM

## 2022-12-07 ENCOUNTER — Ambulatory Visit: Payer: Medicare Other | Admitting: Orthopedic Surgery

## 2022-12-07 ENCOUNTER — Telehealth: Payer: Self-pay | Admitting: Orthopedic Surgery

## 2022-12-07 MED ORDER — OXYCODONE HCL 5 MG PO TABS: 5.0000 mg | ORAL_TABLET | Freq: Four times a day (QID) | ORAL | 0 refills | Status: DC | PRN

## 2022-12-07 NOTE — Telephone Encounter (Signed)
Patient lvm stating that she's in some major pain and would like some pain medication called in.  Pt's # [phone]

## 2022-12-10 ENCOUNTER — Ambulatory Visit: Payer: Self-pay | Admitting: Student

## 2022-12-10 DIAGNOSIS — S82141A Displaced bicondylar fracture of right tibia, initial encounter for closed fracture: Secondary | ICD-10-CM

## 2022-12-10 DIAGNOSIS — S82121A Displaced fracture of lateral condyle of right tibia, initial encounter for closed fracture: Secondary | ICD-10-CM | POA: Diagnosis not present

## 2022-12-11 ENCOUNTER — Ambulatory Visit: Payer: Medicare Other | Admitting: Orthopedic Surgery

## 2022-12-11 ENCOUNTER — Encounter (HOSPITAL_COMMUNITY): Payer: Self-pay | Admitting: Student

## 2022-12-11 ENCOUNTER — Other Ambulatory Visit: Payer: Self-pay

## 2022-12-11 MED ORDER — LACTATED RINGERS IV SOLN: INTRAVENOUS | Status: DC

## 2022-12-11 NOTE — Progress Notes (Addendum)
Ms Blankinship denies chest pain or shortness of breath. Patient denies having any s/s of Covid in her household, also denies any known exposure to Covid.  Ms Tollefsen denies any s/s of upper or lower  respiratory infections in the past 2 months. Dr. Lelon Huh is Ms Izquierdo's PCP.  Ms Grenz was hospitalized with GI bleeding in January of this year. Patient's oxygen saturations dropped due to anemia, Ms Grindstaff was discharged with oxygen.  Patient followed up with PCP, patient is not wearing oxygen at this time, patient checks pulse ox, which has been running 92-98%. Ms Vicens has an appointment with PCP coming up, she hopes that the oxygen will be discontinued at that time.  Ms Shaughnessy reports that her heart, hopes around at times, not very frequently, patient denies a fast rate or shortness of breath, lightheadedness.  Patient reports that it happens so seldomly, that she hardly feels it is worth mentioning. I asked anesthesia PA-C to review.

## 2022-12-11 NOTE — H&P (Signed)
[facility] ([facility]) H&P  Patient ID: Pamela Henson MRN: [id number] DOB/AGE: [birth year] 72 y.o.  Reason for Surgery: Right tibial plateau fracture  HPI: Pamela Henson is an 72 y.o. female presenting for surgery on right lower extremity.  Patient sustained a fall on 12/04/2022, had immediate pain in the right knee and was unable to weight-bear on the right lower extremity.  She was seen by Dr. Amedeo Kinsman on day of injury and found to have a right tibial plateau fracture.  She underwent urgent CT scan for further evaluation of the fracture pattern.  Due to complex nature of the injury, patient was referred to OTS for definitive fixation of the fracture.  Patient seen in [place] clinic on 12/10/2022 to discuss surgical intervention.  She presents now for ORIF of the right tibial plateau.  Patient ambulates at baseline with no assistive device.  Not currently on any anticoagulation.  Denies any previous injury or surgery to the right lower extremity.  Past Medical History:  Diagnosis Date   Bipolar 1 disorder ([place])    Bipolar 1 disorder ([place])    History of chicken pox    History of measles    History of mumps     Past Surgical History:  Procedure Laterality Date   ABDOMINAL HYSTERECTOMY  1979   age 89 due to vaginal bleeding   COLONOSCOPY N/A 10/30/2017   Procedure: COLONOSCOPY;  Surgeon: Rogene Houston, MD;  [facility];  Service: Endoscopy;  Laterality: N/A;  1225   COLONOSCOPY WITH PROPOFOL N/A 10/13/2022   Procedure: COLONOSCOPY WITH PROPOFOL;  Surgeon: Harvel Quale, MD;  [facility];  Service: Gastroenterology;  Laterality: N/A;   ESOPHAGOGASTRODUODENOSCOPY (EGD) WITH PROPOFOL N/A 10/13/2022   Procedure: ESOPHAGOGASTRODUODENOSCOPY (EGD) WITH PROPOFOL;  Surgeon: Harvel Quale, MD;  [facility];  Service: Gastroenterology;  Laterality: N/A;   POLYPECTOMY  10/13/2022   Procedure: POLYPECTOMY;  Surgeon: Harvel Quale, MD;  [facility];  Service: Gastroenterology;;    Family History  Problem Relation Age of Onset   Stroke Mother 29   Stroke Father 15   Colon cancer Neg Hx     Social History:  reports that she quit smoking about 24 years ago. Her smoking use included cigarettes. She has a 15.00 pack-year smoking history. She has never used smokeless tobacco. She reports that she does not drink alcohol and does not use drugs.  Allergies: No Known Allergies  Medications: I have reviewed the patient's current medications. Prior to Admission:  No medications prior to admission.    ROS: Constitutional: No fever or chills Vision: No changes in vision ENT: No difficulty swallowing CV: No chest pain Pulm: No SOB or wheezing GI: No nausea or vomiting GU: No urgency or inability to hold urine Skin: No poor wound healing Neurologic: No numbness or tingling Psychiatric: No depression or anxiety Heme: No bruising Allergic: No reaction to medications or food   Exam: There were no vitals taken for this visit. General: No acute distress Orientation: Alert and oriented x 4 Mood and Affect: Mood and affect appropriate, pleasant and cooperative Gait: Nonweightbearing right lower extremity Coordination and balance: Within normal limits  Right lower extremity: Knee immobilizer in place, this was removed for exam.  Diffuse swelling about the knee.  No significant bruising.  Notable tenderness about the knee and proximal tibia.  Range of motion testing deferred due to known fracture. Stability testing deferred. Toes warm and well-perfused.  Left lower extremity: Skin without lesions. No tenderness to palpation. Full painless ROM, full strength in each muscle group without evidence of instability.  Motor and sensory function at baseline.  Neurovascularly intact.   Medical Decision Making: Data: Imaging: CT scan right knee shows comminuted, displaced lateral tibial plateau fracture with  notable depression  Labs: No results found for this or any previous visit (from the past 168 hour(s)).  Assessment/Plan: 72 year old female status post fall on 12/03/2022, resulting in right tibial plateau fracture  Patient was significant injury to right lower extremity which require surgical intervention.  Recommend proceeding with open reduction internal fixation of the right tibial plateau.  Risk and benefits of procedure have been discussed with patient. Risks discussed included bleeding, infection, malunion, nonunion, damage to surrounding nerves and blood vessels, pain, hardware prominence or irritation, hardware failure, stiffness, post-traumatic arthritis, DVT/PE, compartment syndrome, and even anesthesia complications.  Patient states her understanding of these risks and agrees to proceed with surgery.  Consent will be obtained.  Will admit the patient postoperatively for pain control and therapies.  Gwinda Passe PA-C [facility] [phone] (office) [email or web address]

## 2022-12-12 ENCOUNTER — Inpatient Hospital Stay (HOSPITAL_COMMUNITY): Payer: Medicare Other

## 2022-12-12 ENCOUNTER — Ambulatory Visit: Payer: Medicare Other | Admitting: Orthopedic Surgery

## 2022-12-12 ENCOUNTER — Other Ambulatory Visit: Payer: Self-pay

## 2022-12-12 ENCOUNTER — Encounter (HOSPITAL_COMMUNITY): Payer: Self-pay | Admitting: Student

## 2022-12-12 ENCOUNTER — Inpatient Hospital Stay (HOSPITAL_COMMUNITY): Payer: Medicare Other | Admitting: Physician Assistant

## 2022-12-12 ENCOUNTER — Ambulatory Visit: Payer: Medicare Other | Admitting: Family Medicine

## 2022-12-12 ENCOUNTER — Encounter (HOSPITAL_COMMUNITY)
Admission: RE | Disposition: A | Discharge status: Home Health | Payer: Self-pay | Source: Home / Self Care | Attending: Student

## 2022-12-12 ENCOUNTER — Inpatient Hospital Stay (HOSPITAL_COMMUNITY)
Admission: RE | Admit: 2022-12-12 | Discharge: 2022-12-15 | DRG: 493 | Disposition: A | Discharge status: Home Health | Payer: Medicare Other | Attending: Student | Admitting: Student

## 2022-12-12 DIAGNOSIS — Z886 Allergy status to analgesic agent status: Secondary | ICD-10-CM | POA: Diagnosis not present

## 2022-12-12 DIAGNOSIS — D62 Acute posthemorrhagic anemia: Secondary | ICD-10-CM | POA: Diagnosis not present

## 2022-12-12 DIAGNOSIS — F319 Bipolar disorder, unspecified: Secondary | ICD-10-CM

## 2022-12-12 DIAGNOSIS — G8918 Other acute postprocedural pain: Secondary | ICD-10-CM | POA: Diagnosis not present

## 2022-12-12 DIAGNOSIS — R0902 Hypoxemia: Secondary | ICD-10-CM | POA: Diagnosis not present

## 2022-12-12 DIAGNOSIS — S82141A Displaced bicondylar fracture of right tibia, initial encounter for closed fracture: Principal | ICD-10-CM | POA: Diagnosis present

## 2022-12-12 DIAGNOSIS — D649 Anemia, unspecified: Secondary | ICD-10-CM | POA: Diagnosis not present

## 2022-12-12 DIAGNOSIS — W1830XA Fall on same level, unspecified, initial encounter: Secondary | ICD-10-CM | POA: Diagnosis present

## 2022-12-12 DIAGNOSIS — K5791 Diverticulosis of intestine, part unspecified, without perforation or abscess with bleeding: Secondary | ICD-10-CM | POA: Diagnosis not present

## 2022-12-12 DIAGNOSIS — Z87891 Personal history of nicotine dependence: Secondary | ICD-10-CM | POA: Diagnosis not present

## 2022-12-12 DIAGNOSIS — M1990 Unspecified osteoarthritis, unspecified site: Secondary | ICD-10-CM | POA: Diagnosis not present

## 2022-12-12 DIAGNOSIS — Z823 Family history of stroke: Secondary | ICD-10-CM | POA: Diagnosis not present

## 2022-12-12 HISTORY — DX: Anemia, unspecified: D64.9

## 2022-12-12 HISTORY — DX: Retention of urine, unspecified: R33.9

## 2022-12-12 HISTORY — PX: ORIF TIBIA PLATEAU: SHX2132

## 2022-12-12 HISTORY — DX: Other complications of anesthesia, initial encounter: T88.59XA

## 2022-12-12 HISTORY — DX: Nausea with vomiting, unspecified: R11.2

## 2022-12-12 HISTORY — DX: Headache, unspecified: R51.9

## 2022-12-12 HISTORY — DX: Other specified postprocedural states: Z98.890

## 2022-12-12 LAB — BASIC METABOLIC PANEL
Anion gap: 11 (ref 5–15)
BUN: 19 mg/dL (ref 8–23)
CO2: 22 mmol/L (ref 22–32)
Calcium: 8.8 mg/dL — ABNORMAL LOW (ref 8.9–10.3)
Chloride: 106 mmol/L (ref 98–111)
Creatinine, Ser: 0.67 mg/dL (ref 0.44–1.00)
GFR, Estimated: >60 mL/min (ref >60)
Glucose, Bld: 108 mg/dL — ABNORMAL HIGH (ref 70–99)
Potassium: 4.3 mmol/L (ref 3.5–5.1)
Sodium: 139 mmol/L (ref 135–145)

## 2022-12-12 LAB — CBC WITH DIFFERENTIAL/PLATELET
Abs Immature Granulocytes: 0.02 10*3/uL (ref 0.00–0.07)
Basophils Absolute: 0.1 10*3/uL (ref 0.0–0.1)
Basophils Relative: 1 %
Eosinophils Absolute: 0.1 10*3/uL (ref 0.0–0.5)
Eosinophils Relative: 1 %
HCT: 37.7 % (ref 36.0–46.0)
Hemoglobin: 11.4 g/dL — ABNORMAL LOW (ref 12.0–15.0)
Immature Granulocytes: 0 %
Lymphocytes Relative: 17 %
Lymphs Abs: 1.8 10*3/uL (ref 0.7–4.0)
MCH: 29.1 pg (ref 26.0–34.0)
MCHC: 30.2 g/dL (ref 30.0–36.0)
MCV: 96.2 fL (ref 80.0–100.0)
Monocytes Absolute: 0.7 10*3/uL (ref 0.1–1.0)
Monocytes Relative: 7 %
Neutro Abs: 7.6 10*3/uL (ref 1.7–7.7)
Neutrophils Relative %: 74 %
Platelets: 347 10*3/uL (ref 150–400)
RBC: 3.92 MIL/uL (ref 3.87–5.11)
RDW: 17 % — ABNORMAL HIGH (ref 11.5–15.5)
WBC: 10.3 10*3/uL (ref 4.0–10.5)
nRBC: 0 % (ref 0.0–0.2)

## 2022-12-12 SURGERY — OPEN REDUCTION INTERNAL FIXATION (ORIF) TIBIAL PLATEAU
Anesthesia: General | Laterality: Right

## 2022-12-12 MED ORDER — PROPOFOL 10 MG/ML IV BOLUS
INTRAVENOUS | Status: DC | PRN
  Administered 2022-12-12: 120 mg via INTRAVENOUS

## 2022-12-12 MED ORDER — ONDANSETRON HCL 4 MG/2ML IJ SOLN
INTRAMUSCULAR | Status: DC | PRN
  Administered 2022-12-12: 4 mg via INTRAVENOUS

## 2022-12-12 MED ORDER — VANCOMYCIN HCL 1000 MG IV SOLR
INTRAVENOUS | Status: DC | PRN
  Administered 2022-12-12: 1000 mg

## 2022-12-12 MED ORDER — CLONIDINE HCL (ANALGESIA) 100 MCG/ML EP SOLN
EPIDURAL | Status: DC | PRN
  Administered 2022-12-12: 50 ug
  Administered 2022-12-12: 30 ug

## 2022-12-12 MED ORDER — SERTRALINE HCL 50 MG PO TABS
50.0000 mg | ORAL_TABLET | Freq: Every day | ORAL | Status: DC
  Administered 2022-12-12 – 2022-12-15 (×4): 50 mg via ORAL
  Filled 2022-12-12 (×4): qty 1

## 2022-12-12 MED ORDER — LIDOCAINE 2% (20 MG/ML) 5 ML SYRINGE
INTRAMUSCULAR | Status: DC | PRN
  Administered 2022-12-12: 40 mg via INTRAVENOUS

## 2022-12-12 MED ORDER — GABAPENTIN 100 MG PO CAPS
100.0000 mg | ORAL_CAPSULE | Freq: Three times a day (TID) | ORAL | Status: DC
  Administered 2022-12-12 – 2022-12-15 (×9): 100 mg via ORAL
  Filled 2022-12-12 (×10): qty 1

## 2022-12-12 MED ORDER — TAMSULOSIN HCL 0.4 MG PO CAPS: 0.4000 mg | ORAL_CAPSULE | Freq: Every day | ORAL | Status: DC

## 2022-12-12 MED ORDER — ACETAMINOPHEN 500 MG PO TABS
1000.0000 mg | ORAL_TABLET | Freq: Four times a day (QID) | ORAL | Status: DC
  Administered 2022-12-12 – 2022-12-15 (×7): 1000 mg via ORAL
  Filled 2022-12-12 (×7): qty 2

## 2022-12-12 MED ORDER — PHENYLEPHRINE 80 MCG/ML (10ML) SYRINGE FOR IV PUSH (FOR BLOOD PRESSURE SUPPORT)
PREFILLED_SYRINGE | INTRAVENOUS | Status: DC | PRN
  Administered 2022-12-12: 80 ug via INTRAVENOUS

## 2022-12-12 MED ORDER — PROMETHAZINE HCL 25 MG/ML IJ SOLN: 6.2500 mg | INTRAMUSCULAR | Status: DC | PRN

## 2022-12-12 MED ORDER — CHLORHEXIDINE GLUCONATE 0.12 % MT SOLN
15.0000 mL | Freq: Once | OROMUCOSAL | Status: AC
  Administered 2022-12-12: 15 mL via OROMUCOSAL
  Filled 2022-12-12: qty 15

## 2022-12-12 MED ORDER — ONDANSETRON HCL 4 MG PO TABS: 4.0000 mg | ORAL_TABLET | Freq: Four times a day (QID) | ORAL | Status: DC | PRN

## 2022-12-12 MED ORDER — FENTANYL CITRATE (PF) 100 MCG/2ML IJ SOLN: 25.0000 ug | INTRAMUSCULAR | Status: DC | PRN

## 2022-12-12 MED ORDER — PHENYLEPHRINE HCL-NACL 20-0.9 MG/250ML-% IV SOLN
INTRAVENOUS | Status: AC
  Filled 2022-12-12: qty 250

## 2022-12-12 MED ORDER — METHOCARBAMOL 1000 MG/10ML IJ SOLN: 500.0000 mg | Freq: Four times a day (QID) | INTRAVENOUS | Status: DC | PRN

## 2022-12-12 MED ORDER — FENTANYL CITRATE (PF) 100 MCG/2ML IJ SOLN
INTRAMUSCULAR | Status: AC
  Administered 2022-12-12: 50 ug via INTRAVENOUS
  Filled 2022-12-12: qty 2

## 2022-12-12 MED ORDER — PROPOFOL 10 MG/ML IV BOLUS
INTRAVENOUS | Status: AC
  Filled 2022-12-12: qty 20

## 2022-12-12 MED ORDER — ACETAMINOPHEN 500 MG PO TABS
500.0000 mg | ORAL_TABLET | Freq: Once | ORAL | Status: AC
  Administered 2022-12-12: 500 mg via ORAL

## 2022-12-12 MED ORDER — POLYETHYLENE GLYCOL 3350 17 G PO PACK: 17.0000 g | PACK | Freq: Every day | ORAL | Status: DC | PRN

## 2022-12-12 MED ORDER — ACETAMINOPHEN 500 MG PO TABS
1000.0000 mg | ORAL_TABLET | Freq: Once | ORAL | Status: DC
  Filled 2022-12-12: qty 2

## 2022-12-12 MED ORDER — ONDANSETRON HCL 4 MG/2ML IJ SOLN: 4.0000 mg | Freq: Four times a day (QID) | INTRAMUSCULAR | Status: DC | PRN

## 2022-12-12 MED ORDER — ONDANSETRON HCL 4 MG/2ML IJ SOLN
INTRAMUSCULAR | Status: AC
  Filled 2022-12-12: qty 2

## 2022-12-12 MED ORDER — METOCLOPRAMIDE HCL 5 MG PO TABS: 5.0000 mg | ORAL_TABLET | Freq: Three times a day (TID) | ORAL | Status: DC | PRN

## 2022-12-12 MED ORDER — MIDAZOLAM HCL 2 MG/2ML IJ SOLN
INTRAMUSCULAR | Status: AC
  Filled 2022-12-12: qty 2

## 2022-12-12 MED ORDER — 0.9 % SODIUM CHLORIDE (POUR BTL) OPTIME
TOPICAL | Status: DC | PRN
  Administered 2022-12-12: 1000 mL

## 2022-12-12 MED ORDER — CEFAZOLIN SODIUM-DEXTROSE 2-4 GM/100ML-% IV SOLN
2.0000 g | Freq: Three times a day (TID) | INTRAVENOUS | Status: AC
  Administered 2022-12-12 – 2022-12-13 (×3): 2 g via INTRAVENOUS
  Filled 2022-12-12 (×3): qty 100

## 2022-12-12 MED ORDER — METOCLOPRAMIDE HCL 5 MG/ML IJ SOLN: 5.0000 mg | Freq: Three times a day (TID) | INTRAMUSCULAR | Status: DC | PRN

## 2022-12-12 MED ORDER — METHOCARBAMOL 500 MG PO TABS
500.0000 mg | ORAL_TABLET | Freq: Four times a day (QID) | ORAL | Status: DC | PRN
  Administered 2022-12-13 – 2022-12-14 (×2): 500 mg via ORAL
  Filled 2022-12-12 (×2): qty 1

## 2022-12-12 MED ORDER — CEFAZOLIN SODIUM-DEXTROSE 2-4 GM/100ML-% IV SOLN
2.0000 g | INTRAVENOUS | Status: AC
  Administered 2022-12-12: 2 g via INTRAVENOUS
  Filled 2022-12-12: qty 100

## 2022-12-12 MED ORDER — ORAL CARE MOUTH RINSE: 15.0000 mL | Freq: Once | OROMUCOSAL | Status: AC

## 2022-12-12 MED ORDER — DEXAMETHASONE SODIUM PHOSPHATE 4 MG/ML IJ SOLN
INTRAMUSCULAR | Status: DC | PRN
  Administered 2022-12-12: 2 mg via PERINEURAL
  Administered 2022-12-12: 3 mg via PERINEURAL

## 2022-12-12 MED ORDER — LACTATED RINGERS IV SOLN: INTRAVENOUS | Status: DC

## 2022-12-12 MED ORDER — PROPOFOL 500 MG/50ML IV EMUL
INTRAVENOUS | Status: DC | PRN
  Administered 2022-12-12: 100 ug/kg/min via INTRAVENOUS

## 2022-12-12 MED ORDER — DIPHENHYDRAMINE HCL 12.5 MG/5ML PO ELIX: 12.5000 mg | ORAL_SOLUTION | ORAL | Status: DC | PRN

## 2022-12-12 MED ORDER — SODIUM CHLORIDE 0.9 % IV SOLN: INTRAVENOUS | Status: DC

## 2022-12-12 MED ORDER — HYDRALAZINE HCL 10 MG PO TABS: 10.0000 mg | ORAL_TABLET | Freq: Four times a day (QID) | ORAL | Status: DC | PRN

## 2022-12-12 MED ORDER — DEXAMETHASONE SODIUM PHOSPHATE 10 MG/ML IJ SOLN
INTRAMUSCULAR | Status: DC | PRN
  Administered 2022-12-12: 10 mg via INTRAVENOUS

## 2022-12-12 MED ORDER — FENTANYL CITRATE (PF) 100 MCG/2ML IJ SOLN: 50.0000 ug | Freq: Once | INTRAMUSCULAR | Status: AC

## 2022-12-12 MED ORDER — DOCUSATE SODIUM 100 MG PO CAPS
100.0000 mg | ORAL_CAPSULE | Freq: Two times a day (BID) | ORAL | Status: DC
  Administered 2022-12-12 – 2022-12-15 (×4): 100 mg via ORAL
  Filled 2022-12-12 (×7): qty 1

## 2022-12-12 MED ORDER — OXYCODONE HCL 5 MG PO TABS
5.0000 mg | ORAL_TABLET | ORAL | Status: DC | PRN
  Administered 2022-12-13 (×2): 5 mg via ORAL
  Filled 2022-12-12 (×2): qty 1

## 2022-12-12 MED ORDER — LIDOCAINE 2% (20 MG/ML) 5 ML SYRINGE
INTRAMUSCULAR | Status: AC
  Filled 2022-12-12: qty 5

## 2022-12-12 MED ORDER — DEXAMETHASONE SODIUM PHOSPHATE 10 MG/ML IJ SOLN
INTRAMUSCULAR | Status: AC
  Filled 2022-12-12: qty 1

## 2022-12-12 MED ORDER — HYDROMORPHONE HCL 1 MG/ML IJ SOLN: 0.5000 mg | INTRAMUSCULAR | Status: DC | PRN

## 2022-12-12 MED ORDER — VANCOMYCIN HCL 1000 MG IV SOLR
INTRAVENOUS | Status: AC
  Filled 2022-12-12: qty 20

## 2022-12-12 MED ORDER — ROPIVACAINE HCL 5 MG/ML IJ SOLN
INTRAMUSCULAR | Status: DC | PRN
  Administered 2022-12-12: 20 mL via PERINEURAL
  Administered 2022-12-12: 25 mL via PERINEURAL

## 2022-12-12 SURGICAL SUPPLY — 77 items
APL PRP STRL LF DISP 70% ISPRP (MISCELLANEOUS) ×2
BAG COUNTER SPONGE SURGICOUNT (BAG) ×1 IMPLANT
BAG SPNG CNTER NS LX DISP (BAG) ×1
BANDAGE ESMARK 6X9 LF (GAUZE/BANDAGES/DRESSINGS) ×1 IMPLANT
BIT DRILL CALIBR QC 2.8X250 (BIT) IMPLANT
BIT DRILL QC SFS 2.5X170 (BIT) IMPLANT
BLADE CLIPPER SURG (BLADE) IMPLANT
BLADE SURG 15 STRL LF DISP TIS (BLADE) ×1 IMPLANT
BLADE SURG 15 STRL SS (BLADE) ×1
BNDG CMPR 5X62 HK CLSR LF (GAUZE/BANDAGES/DRESSINGS) ×1
BNDG CMPR 9X6 STRL LF SNTH (GAUZE/BANDAGES/DRESSINGS) ×1
BNDG ELASTIC 4X5.8 VLCR STR LF (GAUZE/BANDAGES/DRESSINGS) IMPLANT
BNDG ELASTIC 6INX 5YD STR LF (GAUZE/BANDAGES/DRESSINGS) IMPLANT
BNDG ESMARK 6X9 LF (GAUZE/BANDAGES/DRESSINGS) ×1
BRUSH SCRUB EZ PLAIN DRY (MISCELLANEOUS) ×2 IMPLANT
CANISTER SUCT 3000ML PPV (MISCELLANEOUS) ×1 IMPLANT
CHLORAPREP W/TINT 26 (MISCELLANEOUS) ×2 IMPLANT
COVER SURGICAL LIGHT HANDLE (MISCELLANEOUS) ×1 IMPLANT
CUFF TOURN SGL QUICK 34 (TOURNIQUET CUFF) ×1
CUFF TRNQT CYL 34X4.125X (TOURNIQUET CUFF) ×1 IMPLANT
DRAPE C-ARM 42X72 X-RAY (DRAPES) ×1 IMPLANT
DRAPE C-ARMOR (DRAPES) ×1 IMPLANT
DRAPE ORTHO SPLIT 77X108 STRL (DRAPES)
DRAPE SURG ORHT 6 SPLT 77X108 (DRAPES) ×2 IMPLANT
DRAPE U-SHAPE 47X51 STRL (DRAPES) ×1 IMPLANT
DRSG MEPITEL 4X7.2 (GAUZE/BANDAGES/DRESSINGS) IMPLANT
ELECT REM PT RETURN 9FT ADLT (ELECTROSURGICAL) ×1
ELECTRODE REM PT RTRN 9FT ADLT (ELECTROSURGICAL) ×1 IMPLANT
GAUZE PAD ABD 8X10 STRL (GAUZE/BANDAGES/DRESSINGS) ×2 IMPLANT
GAUZE SPONGE 4X4 12PLY STRL (GAUZE/BANDAGES/DRESSINGS) ×1 IMPLANT
GLOVE BIO SURGEON STRL SZ 6.5 (GLOVE) ×3 IMPLANT
GLOVE BIO SURGEON STRL SZ7.5 (GLOVE) ×4 IMPLANT
GLOVE BIOGEL PI IND STRL 6.5 (GLOVE) ×1 IMPLANT
GLOVE BIOGEL PI IND STRL 7.5 (GLOVE) ×1 IMPLANT
GOWN STRL REUS W/ TWL LRG LVL3 (GOWN DISPOSABLE) ×2 IMPLANT
GOWN STRL REUS W/TWL LRG LVL3 (GOWN DISPOSABLE) ×2
IMMOBILIZER KNEE 22 UNIV (SOFTGOODS) ×1 IMPLANT
K-WIRE 1.6X150 (WIRE) ×1
KIT BASIN OR (CUSTOM PROCEDURE TRAY) ×1 IMPLANT
KIT TURNOVER KIT B (KITS) ×1 IMPLANT
KWIRE 1.6X150 (WIRE) IMPLANT
NDL SUT 6 .5 CRC .975X.05 MAYO (NEEDLE) ×1 IMPLANT
NEEDLE MAYO TAPER (NEEDLE) ×3
NS IRRIG 1000ML POUR BTL (IV SOLUTION) ×1 IMPLANT
PACK TOTAL JOINT (CUSTOM PROCEDURE TRAY) ×1 IMPLANT
PAD ABD 8X10 STRL (GAUZE/BANDAGES/DRESSINGS) IMPLANT
PAD ARMBOARD 7.5X6 YLW CONV (MISCELLANEOUS) ×2 IMPLANT
PAD CAST 4YDX4 CTTN HI CHSV (CAST SUPPLIES) ×1 IMPLANT
PADDING CAST COTTON 4X4 STRL (CAST SUPPLIES) ×1
PADDING CAST COTTON 6X4 STRL (CAST SUPPLIES) ×1 IMPLANT
PADDING CAST SYNTHETIC 6X4 NS (CAST SUPPLIES) IMPLANT
PLATE LOCK VA-LCP F/3.5X117 6H (Plate) IMPLANT
SCREW CORTEX 3.5X80MM (Screw) IMPLANT
SCREW LOCK CORT ST 3.5X34 (Screw) IMPLANT
SCREW LOCK CORT ST 3.5X36 (Screw) IMPLANT
SCREW LOCK CORT ST 3.5X40 (Screw) IMPLANT
SCREW LOCKING 3.5X70MM VA (Screw) IMPLANT
SCREW LOCKING VA 3.5X75MM (Screw) IMPLANT
SCREW VA-LOCKING 65MM 3.5 (Screw) IMPLANT
STAPLER VISISTAT 35W (STAPLE) ×1 IMPLANT
SUCTION FRAZIER HANDLE 10FR (MISCELLANEOUS) ×1
SUCTION TUBE FRAZIER 10FR DISP (MISCELLANEOUS) ×1 IMPLANT
SUT ETHILON 2 0 FS 18 (SUTURE) ×1 IMPLANT
SUT ETHILON 3 0 PS 1 (SUTURE) IMPLANT
SUT FIBERWIRE #2 38 T-5 BLUE (SUTURE)
SUT VIC AB 0 CT1 27 (SUTURE) ×1
SUT VIC AB 0 CT1 27XBRD ANBCTR (SUTURE) IMPLANT
SUT VIC AB 1 CT1 18XCR BRD 8 (SUTURE) IMPLANT
SUT VIC AB 1 CT1 27 (SUTURE) ×1
SUT VIC AB 1 CT1 27XBRD ANBCTR (SUTURE) ×1 IMPLANT
SUT VIC AB 1 CT1 8-18 (SUTURE)
SUT VIC AB 2-0 CT1 27 (SUTURE) ×1
SUT VIC AB 2-0 CT1 TAPERPNT 27 (SUTURE) ×2 IMPLANT
SUTURE FIBERWR #2 38 T-5 BLUE (SUTURE) IMPLANT
TOWEL GREEN STERILE (TOWEL DISPOSABLE) ×2 IMPLANT
TRAY FOLEY MTR SLVR 16FR STAT (SET/KITS/TRAYS/PACK) IMPLANT
WATER STERILE IRR 1000ML POUR (IV SOLUTION) ×2 IMPLANT

## 2022-12-12 NOTE — Evaluation (Signed)
Physical Therapy Evaluation Patient Details Name: Pamela Henson MRN: [id number] DOB: 07-Sep-1951 Today's Date: 12/12/2022  History of Present Illness  Pt is 72 yo female presenting for planned surgery of the RLE after sustaining a fall on 12/04/22. Pt is currently post op ORIF on 12/12/22 of the R bicondylar tibial plateau. PMH: Bipolar disorder and hsterectomy.  Clinical Impression  Pt is presenting below baseline. Previously she was completely independent. Currently due to WB precautions she requires Min A for sit to stand and was unable to progress gait. Pt demonstrates good strength in the LLE and was previously getting around her home with a rollator. Pt needs the correct equipment and will most likely be able to safely go home with HHPT and intermittent assistance from family/friends/neighbors. At this time recommending skilled physical therapy services with HHPT on discharge from acute care hospital setting with the correct equipment including BSC, W/C and RW in order to decrease risk for fall, injury, to protect the integrity of surgery and decrease risk for re-hospitalization. Pt O2 and HR remained WNL throughout session.         Recommendations for follow up therapy are one component of a multi-disciplinary discharge planning process, led by the attending physician.  Recommendations may be updated based on patient status, additional functional criteria and insurance authorization.  Follow Up Recommendations Home health PT      Assistance Recommended at Discharge Intermittent Supervision/Assistance  Patient can return home with the following  A little help with walking and/or transfers;Help with stairs or ramp for entrance;Assist for transportation;Assistance with cooking/housework    Equipment Recommendations Rolling walker (2 wheels);BSC/3in1;Wheelchair cushion (measurements PT);Wheelchair (measurements PT) (18 inch wheel chair with removable elevating leg rests and flip back arm  rests)  Recommendations for Other Services       Functional Status Assessment Patient has had a recent decline in their functional status and demonstrates the ability to make significant improvements in function in a reasonable and predictable amount of time.     Precautions / Restrictions Precautions Precautions: Fall Restrictions Weight Bearing Restrictions: Yes RLE Weight Bearing: Non weight bearing      Mobility  Bed Mobility Overal bed mobility: Modified Independent             General bed mobility comments: HOB Elevated ~30 degrees Pt is Mod I using her bil UE for assisting with movement of the RLE. Patient Response: Cooperative  Transfers Overall transfer level: Needs assistance   Transfers: Sit to/from Stand Sit to Stand: Min guard           General transfer comment: Min guard with therapist foot monitoring pt ability to maintain WB precautions. Pt maintained well without frequent cueing.    Ambulation/Gait Ambulation/Gait assistance: Min assist Gait Distance (Feet): 0.5 Feet           General Gait Details: Pt attempted to hop EOB for postioning in the bed. Difficulty clearing the LLE from the floor. Able to maintain WB precautions and required Min A to maintian balance and manage AD.  Stairs Stairs:  (not applicable pt has a level entry)               Balance Overall balance assessment: Mild deficits observed, not formally tested         Pertinent Vitals/Pain Pain Assessment Pain Assessment: 0-10 Pain Score: 0-No pain Pain Intervention(s): Monitored during session    Home Living Family/patient expects to be discharged to:: Private residence Living Arrangements: Alone Available Help at Discharge:  Neighbor;Available PRN/intermittently (neighbor helps with cleaning and yard, she also has family that helps) Type of Home: House Home Access: Level entry       Home Layout: One level Home Equipment: Shower seat - built in       Prior Function Prior Level of Function : Independent/Modified Independent;Driving             Mobility Comments: prior to surgery pt was getting around on rollator but has fallen 2x, prior to fall was independent. ADLs Comments: Pt is mod I with ADL"s, prior to fall was independent.     Hand Dominance   Dominant Hand: Left    Extremity/Trunk Assessment   Upper Extremity Assessment Upper Extremity Assessment: Defer to OT evaluation    Lower Extremity Assessment Lower Extremity Assessment: RLE deficits/detail RLE Deficits / Details: ORIF of the R tibia    Cervical / Trunk Assessment Cervical / Trunk Assessment: Normal  Communication   Communication: No difficulties  Cognition Arousal/Alertness: Awake/alert Behavior During Therapy: WFL for tasks assessed/performed Overall Cognitive Status: Within Functional Limits for tasks assessed        General Comments General comments (skin integrity, edema, etc.): Pt has help from neighbors and family at home. She was getting around on a rollator prior to hospitalization that she borrowed from her neighbor but has had 2 falls.        Assessment/Plan    PT Assessment Patient needs continued PT services  PT Problem List Decreased strength;Decreased mobility;Decreased balance       PT Treatment Interventions DME instruction;Therapeutic activities;Cognitive remediation;Gait training;Therapeutic exercise;Patient/family education;Balance training;Functional mobility training;Neuromuscular re-education;Manual techniques    PT Goals (Current goals can be found in the Care Plan section)  Acute Rehab PT Goals Patient Stated Goal: To go home if possible but to be safe. PT Goal Formulation: With patient Time For Goal Achievement: 12/26/22 Potential to Achieve Goals: Good Additional Goals Additional Goal #1: Will know how to manage W/C and wheel 100 ft independently    Frequency Min 5X/week        AM-PAC PT "6 Clicks"  Mobility  Outcome Measure Help needed turning from your back to your side while in a flat bed without using bedrails?: None Help needed moving from lying on your back to sitting on the side of a flat bed without using bedrails?: None Help needed moving to and from a bed to a chair (including a wheelchair)?: A Little Help needed standing up from a chair using your arms (e.g., wheelchair or bedside chair)?: A Little Help needed to walk in hospital room?: Total Help needed climbing 3-5 steps with a railing? : Total 6 Click Score: 16    End of Session Equipment Utilized During Treatment: Gait belt Activity Tolerance: Patient tolerated treatment well Patient left: in bed;with call bell/phone within reach;with bed alarm set Nurse Communication: Mobility status;Other (comment);Precautions (pt needed to use rest room) PT Visit Diagnosis: Unsteadiness on feet (R26.81);Other abnormalities of gait and mobility (R26.89)    Time: [id number] PT Time Calculation (min) (ACUTE ONLY): 31 min   Charges:   PT Evaluation $PT Eval Low Complexity: 1 Low PT Treatments $Therapeutic Activity: 8-22 mins        Tomma Rakers, DPT, CLT  Acute Rehabilitation Services Office: [phone] (Secure chat preferred)   Ander Purpura 12/12/2022, 4:28 PM

## 2022-12-12 NOTE — Anesthesia Procedure Notes (Addendum)
Anesthesia Regional Block: Popliteal block   Pre-Anesthetic Checklist: , timeout performed,  Correct Patient, Correct Site, Correct Laterality,  Correct Procedure, Correct Position, site marked,  Risks and benefits discussed,  Surgical consent,  Pre-op evaluation,  At surgeon's request and post-op pain management  Laterality: Lower and Right  Prep: chloraprep       Needles:  Injection technique: Single-shot  Needle Type: Stimiplex     Needle Length: 10cm  Needle Gauge: 21     Additional Needles:   Procedures:,,,, ultrasound used (permanent image in chart),,   Motor weakness within 5 minutes.   Nerve Stimulator or Paresthesia:  Response: Foot eversion, 0.6 mA  Additional Responses:   Narrative:  Start time: 12/12/2022 11:00 AM End time: 12/12/2022 11:13 AM Injection made incrementally with aspirations every 5 mL.  Performed by: Personally  Anesthesiologist: Nolon Nations, MD  Additional Notes: Nerve located and needle positioned with direct ultrasound guidance. Good perineural spread. Patient tolerated well.

## 2022-12-12 NOTE — Anesthesia Procedure Notes (Addendum)
Anesthesia Regional Block: Femoral nerve block   Pre-Anesthetic Checklist: , timeout performed,  Correct Patient, Correct Site, Correct Laterality,  Correct Procedure, Correct Position, site marked,  Risks and benefits discussed,  Surgical consent,  Pre-op evaluation,  At surgeon's request and post-op pain management  Laterality: Lower and Right  Prep: chloraprep       Needles:  Injection technique: Single-shot  Needle Type: Stimulator Needle - 80     Needle Length: 9cm  Needle Gauge: 22   Needle insertion depth: 6 cm   Additional Needles:   Procedures:, nerve stimulator,,, ultrasound used (permanent image in chart),,     Nerve Stimulator or Paresthesia:  Response: Patellar snap, 0.6 mA  Additional Responses:   Narrative:  Start time: 12/12/2022 10:45 AM End time: 12/12/2022 10:59 AM Injection made incrementally with aspirations every 5 mL.  Performed by: Personally  Anesthesiologist: Nolon Nations, MD  Additional Notes: BP cuff, EKG monitors applied. Sedation begun. Femoral artery palpated for location of nerve. After nerve location verified with U/S, anesthetic injected incrementally, slowly, and after negative aspirations under direct u/s guidance. Good perineural spread. Patient tolerated well.

## 2022-12-12 NOTE — Anesthesia Procedure Notes (Signed)
Procedure Name: LMA Insertion Date/Time: 12/12/2022 12:29 PM  Performed by: Reggie Pile, CRNAPre-anesthesia Checklist: Timeout performed, Patient identified, Emergency Drugs available, Suction available and Patient being monitored Patient Re-evaluated:Patient Re-evaluated prior to induction Oxygen Delivery Method: Circle system utilized Preoxygenation: Pre-oxygenation with 100% oxygen Induction Type: IV induction LMA Size: 4.0 Number of attempts: 1

## 2022-12-12 NOTE — Op Note (Signed)
Orthopaedic Surgery Operative Note (CSN: [id number] ) Date of Surgery: 12/12/2022  Admit Date: 12/12/2022   Diagnoses: Pre-Op Diagnoses: Right bicondylar tibial plateau fracture  Post-Op Diagnosis: Same  Procedures: CPT [id number] reduction internal fixation of right tibial plateau fracture  Surgeons : Primary: Shona Needles, MD  Assistant: Patrecia Pace, PA-C  Location: OR 3  Anesthesia: General with regional block   Antibiotics: Ancef 2g preop with 1 gm vancomycin powder placed topically in incision   Tourniquet time: * Missing tourniquet times found for documented tourniquets in log: [id number] * Total Tourniquet Time Documented: Thigh (Right) - 60 minutes Total: Thigh (Right) - 60 minutes  Estimated Blood Loss: 50 mL  Complications: None   Specimens: None   Implants: Implant Name Type Inv. Item Serial No. Manufacturer Lot No. LRB No. Used Action  SCREW LOCK CORT ST 3.5X36 - [id number] Screw SCREW LOCK CORT ST 3.5X36  [employer]  Right 1 Implanted  SCREW CORTEX 3.5X80MM - [id number] Screw SCREW CORTEX 3.5X80MM  [employer]  Right 1 Implanted  PLATE LOCK VA-LCP 579FGE 6H - [id number] Plate PLATE LOCK VA-LCP 579FGE 6H  [employer]  Right 1 Implanted  SCREW VA-LOCKING 65MM 3.5 - [id number] Screw SCREW VA-LOCKING 65MM 3.5  [employer]  Right 1 Implanted  SCREW LOCKING 3.5X70MM VA - [id number] Screw SCREW LOCKING 3.5X70MM VA  [employer]  Right 1 Implanted  SCREW LOCKING VA 3.5X75MM - [id number] Screw SCREW LOCKING VA 3.5X75MM  [employer]  Right 2 Implanted  SCREW LOCK CORT ST 3.5X34 - [id number] Screw SCREW LOCK CORT ST 3.5X34  [employer]  Right 1 Implanted  SCREW LOCK CORT ST 3.5X40 - [id number] Screw SCREW LOCK CORT ST 3.5X40  [employer]  Right 1 Implanted     Indications for Surgery: 72 year old female who sustained a ground-level fall with a right bicondylar tibial plateau fracture.  Due to the unstable  nature of her injury I recommend proceeding with open reduction internal fixation.  Risks and benefits were discussed with the patient.  Risks include but not limited to bleeding, infection, malunion, nonunion, hardware failure, hardware irritation, nerve or blood vessel injury, DVT, even the possibility anesthetic complications.  She agreed to proceed with surgery and consent was obtained.  Operative Findings: Open reduction internal fixation of right bicondylar tibial plateau fracture using Synthe[employer] VA 3.5 mm proximal tibial locking plate.  Procedure: The patient was identified in the preoperative holding area. Consent was confirmed with the patient and their family and all questions were answered. The operative extremity was marked after confirmation with the patient. she was then brought back to the operating room by our anesthesia colleagues.  She was placed under general anesthetic and carefully transferred over to radiolucent flattop table.  A bump was placed under her operative hip.  A nonsterile tourniquet was placed to her upper thigh.  The right lower extremity was then prepped and draped in usual sterile fashion.  A timeout was performed to verify the patient, the procedure, and the extremity.  Preoperative antibiotics were dosed.  The hip and knee were flexed over a triangle and tourniquet was inflated to 300 mmHg.  Total tourniquet time as noted above.  Fluoroscopic imaging showed the unstable nature of her injury.  A lateral approach to the proximal tibia was carried down through skin and subcutaneous tissue.  The IT band was incised in line with the incision and I developed the interval between the capsule and the IT band.  I then proceeded to release the  IT band off of the proximal tibia.  I exposed the lateral condyle on the way back until I could palpate the fibular head.  I then performed a submeniscal arthrotomy with a 15 blade and tagged the capsule with 0 Vicryl suture for later repair.   There is no meniscus tear.  The majority of the articular cartilage was intact except for the posterior medial aspect of the condyle which was near the intercondylar spine region.  A attempt was used to apply traction and manipulation of the knee to get the posterior medial fragment into reduction.  Unfortunately due to the length of time of the injury this was unsuccessful closed.  As result I made a 4 cm incision along the medial aspect and carried down through skin and subcutaneous tissue.  I incised through the crural fascia below the hamstring tendons and released the gastroc off of the posterior tibia.  I was then able to use a Cobb elevator to clear out hematoma and callus and manipulated the posterior medial fragment.  I was able to also use a footed tamp to enter the fracture site to help manipulate and reduce the articular cartilage of the posterior lateral condyle.  Once I manipulated the posterior medial fragment I was then able to reduce it with a bone hook and a reduction tenaculum.  I held it provisionally with K wires and then proceeded to have my assistant hold the reduction while I placed a Synthes VA 3.5 mm proximal tibial locking plate.  I then placed a nonlocking screw proximally to hold the proximal portion of the plate and then I proceeded to place percutaneous 3.5 millimeter screws into the tibial shaft.  I then returned to the proximal segment and placed 3.5 mm locking screws.  A kickstand screw was placed to provide medial buttress support.  I felt with this fixation that no medial plating was needed.  I then obtained final fluoroscopic imaging.  The incisions were copiously irrigated.  A gram of vancomycin powder was placed into the incision.  A free needle was used to bring the tag sutures from the capsule back through the plate and tied these down.  A layered closure of 0 Vicryl, 2-0 Vicryl and 3-0 nylon was used to close the skin.  Sterile dressings were applied.  Patient was then  awoke from anesthesia and taken to the PACU in stable condition.  Post Op Plan/Instructions: Patient will be nonweightbearing to the right lower extremity.  We will plan for mechanical prophylaxis for DVT since he is contraindicated for anticoagulation due to recent GI bleed.  Will have her mobilize with physical and Occupational Therapy.  I was present and performed the entire surgery.  Patrecia Pace, PA-C did assist me throughout the case. An assistant was necessary given the difficulty in approach, maintenance of reduction and ability to instrument the fracture.   Katha Hamming, MD [facility]

## 2022-12-12 NOTE — Anesthesia Preprocedure Evaluation (Addendum)
Anesthesia Evaluation  Patient identified by MRN, date of birth, ID band Patient awake    Reviewed: Allergy & Precautions, H&P , NPO status , Patient's Chart, lab work & pertinent test results  History of Anesthesia Complications (+) PONV and history of anesthetic complications  Airway Mallampati: II  TM Distance: >3 FB Neck ROM: Full    Dental  (+) Dental Advisory Given, Edentulous Upper, Edentulous Lower   Pulmonary former smoker   Pulmonary exam normal breath sounds clear to auscultation       Cardiovascular Exercise Tolerance: Good negative cardio ROS Normal cardiovascular exam Rhythm:Regular Rate:Normal     Neuro/Psych  Headaches PSYCHIATRIC DISORDERS  Depression Bipolar Disorder      GI/Hepatic negative GI ROS, Neg liver ROS,,,  Endo/Other  negative endocrine ROS    Renal/GU negative Renal ROS     Musculoskeletal  (+) Arthritis ,    Abdominal   Peds  Hematology negative hematology ROS (+) Blood dyscrasia, anemia   Anesthesia Other Findings   Reproductive/Obstetrics                             Anesthesia Physical Anesthesia Plan  ASA: 3  Anesthesia Plan: General   Post-op Pain Management: Tylenol PO (pre-op)* and Gabapentin PO (pre-op)*   Induction: Intravenous  PONV Risk Score and Plan: Ondansetron, Dexamethasone and Treatment may vary due to age or medical condition  Airway Management Planned: LMA  Additional Equipment: None  Intra-op Plan:   Post-operative Plan: Extubation in OR  Informed Consent: I have reviewed the patients History and Physical, chart, labs and discussed the procedure including the risks, benefits and alternatives for the proposed anesthesia with the patient or authorized representative who has indicated his/her understanding and acceptance.     Dental advisory given  Plan Discussed with: CRNA  Anesthesia Plan Comments:         Anesthesia Quick Evaluation

## 2022-12-12 NOTE — Interval H&P Note (Signed)
History and Physical Interval Note:  12/12/2022 11:29 AM  Pamela Henson  has presented today for surgery, with the diagnosis of Right tibial plateau fracture.  The various methods of treatment have been discussed with the patient and family. After consideration of risks, benefits and other options for treatment, the patient has consented to  Procedure(s): OPEN REDUCTION INTERNAL FIXATION (ORIF) TIBIAL PLATEAU (Right) as a surgical intervention.  The patient's history has been reviewed, patient examined, no change in status, stable for surgery.  I have reviewed the patient's chart and labs.  Questions were answered to the patient's satisfaction.     Lennette Bihari P Hinda Lindor

## 2022-12-12 NOTE — Transfer of Care (Signed)
Immediate Anesthesia Transfer of Care Note  Patient: Pamela Henson  Procedure(s) Performed: OPEN REDUCTION INTERNAL FIXATION (ORIF) TIBIAL PLATEAU (Right)  Patient Location: PACU  Anesthesia Type:MAC  Level of Consciousness: awake, alert , and oriented  Airway & Oxygen Therapy: Patient Spontanous Breathing and Patient connected to face mask oxygen  Post-op Assessment: Report given to RN and Post -op Vital signs reviewed and stable  Post vital signs: Reviewed and stable  Last Vitals:  Vitals Value Taken Time  BP 119/72 12/12/22 1405  Temp    Pulse 71 12/12/22 1409  Resp 14 12/12/22 1409  SpO2 98 % 12/12/22 1409  Vitals shown include unvalidated device data.  Last Pain:  Vitals:   12/12/22 1010  TempSrc:   PainSc: 5       Patients Stated Pain Goal: 3 ([id number])  Complications: No notable events documented.

## 2022-12-13 ENCOUNTER — Inpatient Hospital Stay: Payer: Medicare Other | Admitting: Gastroenterology

## 2022-12-13 LAB — CBC
HCT: 31.9 % — ABNORMAL LOW (ref 36.0–46.0)
Hemoglobin: 9.6 g/dL — ABNORMAL LOW (ref 12.0–15.0)
MCH: 28.6 pg (ref 26.0–34.0)
MCHC: 30.1 g/dL (ref 30.0–36.0)
MCV: 94.9 fL (ref 80.0–100.0)
Platelets: 329 10*3/uL (ref 150–400)
RBC: 3.36 MIL/uL — ABNORMAL LOW (ref 3.87–5.11)
RDW: 16.9 % — ABNORMAL HIGH (ref 11.5–15.5)
WBC: 10 10*3/uL (ref 4.0–10.5)
nRBC: 0 % (ref 0.0–0.2)

## 2022-12-13 LAB — BASIC METABOLIC PANEL
Anion gap: 5 (ref 5–15)
BUN: 21 mg/dL (ref 8–23)
CO2: 27 mmol/L (ref 22–32)
Calcium: 8.2 mg/dL — ABNORMAL LOW (ref 8.9–10.3)
Chloride: 107 mmol/L (ref 98–111)
Creatinine, Ser: 0.71 mg/dL (ref 0.44–1.00)
GFR, Estimated: >60 mL/min (ref >60)
Glucose, Bld: 110 mg/dL — ABNORMAL HIGH (ref 70–99)
Potassium: 4.3 mmol/L (ref 3.5–5.1)
Sodium: 139 mmol/L (ref 135–145)

## 2022-12-13 LAB — VITAMIN D 25 HYDROXY (VIT D DEFICIENCY, FRACTURES): Vit D, 25-Hydroxy: 27.26 ng/mL — ABNORMAL LOW (ref 30–100)

## 2022-12-13 NOTE — Progress Notes (Signed)
Physical Therapy Treatment Patient Details Name: Pamela Henson MRN: [id number] DOB: 1950/12/02 Today's Date: 12/13/2022   History of Present Illness Pt is 72 yo female presenting for planned surgery of the RLE after sustaining a fall on 12/04/22. Pt is currently post op ORIF on 12/12/22 of the R bicondylar tibial plateau. PMH: Bipolar disorder and hsterectomy.    PT Comments    Pt was received sitting on BSC and agreeable to session. Pt was determined to progress gait distance this session. Pt was able to tolerate increased gait distance this session with frequent standing rest breaks due to fatigue. Pt able to maintain RLE NWB throughout gait trial. Pt able to perform all mobility this session with up to min guard for safety. Pt required cues for activity pacing due to pt wanting to continue walking despite fatigue. Pt continues to benefit from PT services to progress toward functional mobility goals.     Recommendations for follow up therapy are one component of a multi-disciplinary discharge planning process, led by the attending physician.  Recommendations may be updated based on patient status, additional functional criteria and insurance authorization.  Follow Up Recommendations  Home health PT     Assistance Recommended at Discharge Intermittent Supervision/Assistance  Patient can return home with the following A little help with walking and/or transfers;Help with stairs or ramp for entrance;Assist for transportation;Assistance with cooking/housework   Equipment Recommendations  Rolling walker (2 wheels);BSC/3in1;Wheelchair cushion (measurements PT);Wheelchair (measurements PT)    Recommendations for Other Services       Precautions / Restrictions Precautions Precautions: Fall Restrictions Weight Bearing Restrictions: Yes RLE Weight Bearing: Non weight bearing     Mobility  Bed Mobility               General bed mobility comments: Pt sitting on BSC upon arrival     Transfers Overall transfer level: Needs assistance Equipment used: Rolling walker (2 wheels) Transfers: Sit to/from Stand Sit to Stand: Supervision           General transfer comment: from [facility] to RW with supervision for safety. Pt with good power up and ability to maintain RLE NWB.    Ambulation/Gait Ambulation/Gait assistance: Min guard Gait Distance (Feet): 40 Feet Assistive device: Rolling walker (2 wheels) Gait Pattern/deviations: Step-to pattern, Antalgic       General Gait Details: Pt performing hop-to pattern to maintain RLE NWB with frequent standing rest breaks due to fatigue. Min guard for safety.      Balance Overall balance assessment: Needs assistance Sitting-balance support: No upper extremity supported, Feet supported Sitting balance-Leahy Scale: Normal     Standing balance support: Single extremity supported, During functional activity, Reliant on assistive device for balance Standing balance-Leahy Scale: Poor Standing balance comment: reliant on RW support to maintain RLE NWB                            Cognition Arousal/Alertness: Awake/alert Behavior During Therapy: WFL for tasks assessed/performed Overall Cognitive Status: Within Functional Limits for tasks assessed                                          Exercises      General Comments        Pertinent Vitals/Pain Pain Assessment Pain Assessment: Faces Faces Pain Scale: Hurts even more Pain Location: RLE Pain Descriptors /  Indicators: Aching, Grimacing Pain Intervention(s): Limited activity within patient's tolerance, Monitored during session, Repositioned     PT Goals (current goals can now be found in the care plan section) Acute Rehab PT Goals Patient Stated Goal: To go home if possible but to be safe. PT Goal Formulation: With patient Time For Goal Achievement: 12/26/22 Potential to Achieve Goals: Good Progress towards PT goals: Progressing  toward goals    Frequency    Min 5X/week      PT Plan Current plan remains appropriate       AM-PAC PT "6 Clicks" Mobility   Outcome Measure  Help needed turning from your back to your side while in a flat bed without using bedrails?: None Help needed moving from lying on your back to sitting on the side of a flat bed without using bedrails?: None Help needed moving to and from a bed to a chair (including a wheelchair)?: A Little Help needed standing up from a chair using your arms (e.g., wheelchair or bedside chair)?: A Little Help needed to walk in hospital room?: A Little Help needed climbing 3-5 steps with a railing? : A Lot 6 Click Score: 19    End of Session Equipment Utilized During Treatment: Gait belt Activity Tolerance: Patient tolerated treatment well Patient left: in chair;with call bell/phone within reach Nurse Communication: Mobility status;Precautions PT Visit Diagnosis: Unsteadiness on feet (R26.81);Other abnormalities of gait and mobility (R26.89)     Time: [id number] PT Time Calculation (min) (ACUTE ONLY): 25 min  Charges:  $Gait Training: 23-37 mins                     Michelle Nasuti, PTA Acute Rehabilitation Services Secure Chat Preferred  [phone]    Michelle Nasuti 12/13/2022, 4:38 PM

## 2022-12-13 NOTE — Progress Notes (Signed)
Lab called, they have to re-draw CBC.  Sample drawn had clots in it.

## 2022-12-13 NOTE — Progress Notes (Signed)
Orthopaedic Trauma Progress Note  SUBJECTIVE: Doing fairly well this morning.  Nerve block started to wear off, notes some dull pain around the lateral aspect of the knee.  Notes some tingling in her foot.  No chest pain. No SOB. No nausea/vomiting. No other complaints.   OBJECTIVE:  Vitals:   12/13/22 0459 12/13/22 0827  BP: 111/63 (!) 108/58  Pulse: 64 65  Resp: 17 16  Temp: 98 F (36.7 C) 98.3 F (36.8 C)  SpO2: 93% 91%    General: Sitting up in bed, no acute distress Respiratory: No increased work of breathing.  Right lower extremity: Dressing clean, dry, intact.  Tenderness about the knee as expected.  No significant calf tenderness.  Compartments compressible.  Ankle DF/PF intact.  Decreased sensation over the toes due to nerve block still being partially in effect.  Toes warm and well-perfused.  2+ DP pulse  IMAGING: Stable post op imaging.   LABS:  Results for orders placed or performed during the hospital encounter of 12/12/22 (from the past 24 hour(s))  Basic metabolic panel     Status: Abnormal   Collection Time: 12/12/22  9:49 AM  Result Value Ref Range   Sodium 139 135 - 145 mmol/L   Potassium 4.3 3.5 - 5.1 mmol/L   Chloride 106 98 - 111 mmol/L   CO2 22 22 - 32 mmol/L   Glucose, Bld 108 (H) 70 - 99 mg/dL   BUN 19 8 - 23 mg/dL   Creatinine, Ser 0.67 0.44 - 1.00 mg/dL   Calcium 8.8 (L) 8.9 - 10.3 mg/dL   GFR, Estimated >60 >60 mL/min   Anion gap 11 5 - 15  CBC WITH DIFFERENTIAL     Status: Abnormal   Collection Time: 12/12/22  9:49 AM  Result Value Ref Range   WBC 10.3 4.0 - 10.5 K/uL   RBC 3.92 3.87 - 5.11 MIL/uL   Hemoglobin 11.4 (L) 12.0 - 15.0 g/dL   HCT 37.7 36.0 - 46.0 %   MCV 96.2 80.0 - 100.0 fL   MCH 29.1 26.0 - 34.0 pg   MCHC 30.2 30.0 - 36.0 g/dL   RDW 17.0 (H) 11.5 - 15.5 %   Platelets 347 150 - 400 K/uL   nRBC 0.0 0.0 - 0.2 %   Neutrophils Relative % 74 %   Neutro Abs 7.6 1.7 - 7.7 K/uL   Lymphocytes Relative 17 %   Lymphs Abs 1.8 0.7 - 4.0  K/uL   Monocytes Relative 7 %   Monocytes Absolute 0.7 0.1 - 1.0 K/uL   Eosinophils Relative 1 %   Eosinophils Absolute 0.1 0.0 - 0.5 K/uL   Basophils Relative 1 %   Basophils Absolute 0.1 0.0 - 0.1 K/uL   Immature Granulocytes 0 %   Abs Immature Granulocytes 0.02 0.00 - 0.07 K/uL  Basic metabolic panel     Status: Abnormal   Collection Time: 12/13/22  2:22 AM  Result Value Ref Range   Sodium 139 135 - 145 mmol/L   Potassium 4.3 3.5 - 5.1 mmol/L   Chloride 107 98 - 111 mmol/L   CO2 27 22 - 32 mmol/L   Glucose, Bld 110 (H) 70 - 99 mg/dL   BUN 21 8 - 23 mg/dL   Creatinine, Ser 0.71 0.44 - 1.00 mg/dL   Calcium 8.2 (L) 8.9 - 10.3 mg/dL   GFR, Estimated >60 >60 mL/min   Anion gap 5 5 - 15  CBC     Status: Abnormal  Collection Time: 12/13/22  6:20 AM  Result Value Ref Range   WBC 10.0 4.0 - 10.5 K/uL   RBC 3.36 (L) 3.87 - 5.11 MIL/uL   Hemoglobin 9.6 (L) 12.0 - 15.0 g/dL   HCT 31.9 (L) 36.0 - 46.0 %   MCV 94.9 80.0 - 100.0 fL   MCH 28.6 26.0 - 34.0 pg   MCHC 30.1 30.0 - 36.0 g/dL   RDW 16.9 (H) 11.5 - 15.5 %   Platelets 329 150 - 400 K/uL   nRBC 0.0 0.0 - 0.2 %    ASSESSMENT: Pamela Henson is a 72 y.o. female, 1 Day Post-Op s/p OPEN REDUCTION INTERNAL FIXATION RIGHT TIBIAL PLATEAU  CV/Blood loss: Acute blood loss anemia, Hgb 9.6 this morning. Hemodynamically stable  PLAN: Weightbearing: NWB RLE ROM: Unrestricted knee ROM Incisional and dressing care: Reinforce dressings as needed  Showering: Hold off on showering for now Orthopedic device(s): None  Pain management:  1. Tylenol 1000 mg q 6 hours scheduled 2. Robaxin 500 mg q 6 hours PRN 3. Oxycodone 5-10 mg q 4 hours PRN 4. Dilaudid 0.5-1 mg q 4 hours PRN 5.  Gabapentin 100 mg 3 times daily VTE prophylaxis: No chemical prophylaxis.  SCDs ID:  Ancef 2gm post op Foley/Lines:  No foley, KVO IVFs Impediments to Fracture Healing: Vitamin D level pending, will start supplementation as indicated Dispo: PT/OT evaluation  today, dispo pending.  Patient does live alone but is amenable to SNF if recommended by therapies.    D/C recommendations: -Oxycodone for pain control -Possible need for Vit D supplementation  Follow - up plan: 2 weeks after discharge for wound check and repeat x-rays   Contact information:  Katha Hamming MD, Rushie Nyhan PA-C. After hours and holidays please check [email or web address] for group call information for [facility]   Gwinda Passe, PA-C [phone] (office) [email or web address]

## 2022-12-13 NOTE — TOC Initial Note (Signed)
Transition of Care [facility]) - Initial/Assessment Note    Patient Details  Name: Pamela Henson MRN: [id number] Date of Birth: May 05, 1951  [facility] of [facility]) CM/SW Contact:    Sharin Mons, RN Phone Number: 12/13/2022, 11:03 AM  Clinical Narrative:                     -  s/p OPEN REDUCTION INTERNAL FIXATION RIGHT TIBIAL PLATEAU, 3/20  From home alone. States has good family and neighbor support. PTA independent with ADL's, used a neighbor's rolator with ambulation. Per PT's recommendations: skilled physical therapy services with HHPT on discharge from acute care hospital setting with the correct equipment including BSC, W/C and RW   Pt  agreeable with home health services. Pt without provider preference. Referral made with [facility] and accepted. Pt states has transportation to home once d/c ready. Pt without  RX med concerns.  TOC following and will continue to assist with needs....    Expected Discharge Plan: [place] (vs SNF) Barriers to Discharge: Continued Medical Work up   Patient Goals and CMS Choice            Expected Discharge Plan and Services   Discharge Planning Services: CM Consult   Living arrangements for the past 2 months: Single Family Home                           HH Arranged: PT, OT HH Agency: [facility] [place]: 12/13/22   Representative spoke with at [place]: [place] Arrangements/Services Living arrangements for the past 2 months: [place] Lives with:: Self Patient language and need for interpreter reviewed:: Yes Do you feel safe going back to the place where you live?: Yes      Need for Family Participation in Patient Care: Yes (Comment) Care giver support system in place?: Yes (comment)   Criminal Activity/Legal Involvement Pertinent to Current Situation/Hospitalization: No - Comment as needed  Activities of Daily Living Home Assistive  Devices/Equipment: Eyeglasses ADL Screening (condition at time of admission) Patient's cognitive ability adequate to safely complete daily activities?: Yes Is the patient deaf or have difficulty hearing?: No Does the patient have difficulty seeing, even when wearing glasses/contacts?: No Does the patient have difficulty concentrating, remembering, or making decisions?: No Patient able to express need for assistance with ADLs?: Yes Does the patient have difficulty dressing or bathing?: No Independently performs ADLs?: Yes (appropriate for developmental age) Does the patient have difficulty walking or climbing stairs?: No Weakness of Legs: Right Weakness of Arms/Hands: None  Permission Sought/Granted   Permission granted to share information with : Yes, Verbal Permission Granted              Emotional Assessment Appearance:: Appears stated age Attitude/Demeanor/Rapport: Gracious Affect (typically observed): Accepting Orientation: : Oriented to Place, Oriented to Self, Oriented to  Time, Oriented to Situation Alcohol / Substance Use: Not Applicable Psych Involvement: No (comment)  Admission diagnosis:  Closed fracture of right tibial plateau [S82.141A] Patient Active Problem List   Diagnosis Date Noted   Closed fracture of right tibial plateau 12/12/2022   Constipation 10/14/2022   Hypoxia 10/14/2022   Diverticulosis of intestine w/o perforation or abscess with bleeding 10/14/2022   Lower GI bleeding 10/12/2022   Tobacco use disorder 10/12/2022   Acute blood loss anemia 10/12/2022   Perennial allergic conjunctivitis of both eyes 01/01/2022  Perennial allergic rhinitis 01/01/2022   Chronic rhinitis 11/20/2021   Pruritus 11/20/2021   History of adenomatous polyp of colon 10/31/2017   Guaiac + stool 10/10/2017   Occult blood positive stool 08/14/2017   Arthritis 10/11/2015   Clinical depression 10/11/2015   Degeneration of lumbar or lumbosacral intervertebral disc  01/16/2009   Bipolar affective disorder ([place]) 09/24/1998   PCP:  Birdie Sons, MD Pharmacy:   [facility] [place], [place] AT [place] [id number] [place] [id number] Phone: [phone] Fax: [phone]     Social Determinants of Health (SDOH) Social History: [place]: No Food Insecurity (10/17/2022)  Housing: Low Risk  (10/17/2022)  Transportation Needs: No Transportation Needs (10/17/2022)  Utilities: Not At Risk (10/13/2022)  Alcohol Screen: Low Risk  (10/17/2022)  Depression (PHQ2-9): Low Risk  (10/17/2022)  Tobacco Use: Medium Risk (12/12/2022)   SDOH Interventions:     Readmission Risk Interventions     No data to display

## 2022-12-13 NOTE — Evaluation (Signed)
Occupational Therapy Evaluation Patient Details Name: Pamela Henson MRN: [id number] DOB: December 15, 1950 Today's Date: 12/13/2022   History of Present Illness Pt is 72 yo female presenting for planned surgery of the RLE after sustaining a fall on 12/04/22. Pt is currently post op ORIF on 12/12/22 of the R bicondylar tibial plateau. PMH: Bipolar disorder and hsterectomy.   Clinical Impression   Pt s/p above surgery for RLE.  Pt doing well, was able to get around at home after fx with rollator mod I for ADLs prior to admission to ED. Pt optimistic about going home and performing activities, would like to have a w/c as she states NWB precautions for 6 weeks and states she does not want to "hop" around with RW for the next 6 weeks.  HHPT recommended follow up as needed, no OT follow up necessary as Pt is mod I at home with rollator or w/c for ADLs/IADLs, and has help from family and neighbors as needed. Pt actively participated and motivated to return home.      Recommendations for follow up therapy are one component of a multi-disciplinary discharge planning process, led by the attending physician.  Recommendations may be updated based on patient status, additional functional criteria and insurance authorization.   Follow Up Recommendations  No OT follow up     Assistance Recommended at Discharge PRN  Patient can return home with the following Assistance with cooking/housework;Assist for transportation;A little help with walking and/or transfers    Functional Status Assessment  Patient has had a recent decline in their functional status and demonstrates the ability to make significant improvements in function in a reasonable and predictable amount of time.  Equipment Recommendations  Wheelchair (measurements OT);Other (comment) (RW)    Recommendations for Other Services       Precautions / Restrictions Precautions Precautions: Fall Restrictions Weight Bearing Restrictions: Yes RLE Weight  Bearing: Non weight bearing      Mobility Bed Mobility Overal bed mobility: Modified Independent             General bed mobility comments: mod I with flat bed    Transfers Overall transfer level: Needs assistance Equipment used: Rolling walker (2 wheels) Transfers: Sit to/from Stand, Bed to chair/wheelchair/BSC Sit to Stand: Supervision     Step pivot transfers: Supervision     General transfer comment: S for transfers, good safety awareness, no LOB      Balance Overall balance assessment: Needs assistance   Sitting balance-Leahy Scale: Normal     Standing balance support: Single extremity supported, During functional activity Standing balance-Leahy Scale: Poor Standing balance comment: able to perform LB dressing while standing and bend at hip, requires 1 hand on RW for balance/support                           ADL either performed or assessed with clinical judgement   ADL Overall ADL's : Needs assistance/impaired Eating/Feeding: Independent   Grooming: Supervision/safety   Upper Body Bathing: Supervision/ safety   Lower Body Bathing: Supervison/ safety   Upper Body Dressing : Supervision/safety   Lower Body Dressing: Supervision/safety   Toilet Transfer: Supervision/safety   Toileting- Clothing Manipulation and Hygiene: Modified independent   Tub/ Shower Transfer: Supervision/safety;Rolling walker (2 wheels)   Functional mobility during ADLs: Supervision/safety General ADL Comments: Pt requires supervision for mobility to gather items needed for ADLs with RW, no LOB during ADLs, displays good safety awareness while maintaining precautions.  Vision Baseline Vision/History: 1 Wears glasses Ability to See in Adequate Light: 0 Adequate Patient Visual Report: No change from baseline       Perception     Praxis      Pertinent Vitals/Pain Pain Assessment Pain Assessment: 0-10 Pain Score: 4  Pain Location: RLE Pain Descriptors /  Indicators: Dull Pain Intervention(s): Premedicated before session, Monitored during session     Hand Dominance Left   Extremity/Trunk Assessment Upper Extremity Assessment Upper Extremity Assessment: Overall WFL for tasks assessed   Lower Extremity Assessment Lower Extremity Assessment: Defer to PT evaluation       Communication Communication Communication: No difficulties   Cognition Arousal/Alertness: Awake/alert Behavior During Therapy: WFL for tasks assessed/performed Overall Cognitive Status: Within Functional Limits for tasks assessed                                       General Comments  Pt was getting around at home after fall and fracture mod I with rollator.    Exercises     Shoulder Instructions      Home Living Family/patient expects to be discharged to:: Private residence Living Arrangements: Alone Available Help at Discharge: Neighbor;Available PRN/intermittently Type of Home: House Home Access: Level entry     Home Layout: One level     Bathroom Shower/Tub: Tub/shower unit;Walk-in shower   Bathroom Toilet: Standard Bathroom Accessibility: Yes How Accessible: Accessible via wheelchair Home Equipment: Shower seat - built in   Additional Comments: Pt states her neighbor let her borrow his rollator, she feels unsafe at home using it but was able to perform all activities mod I after the fall, prior to ED admission, would prefer a w/c to get around.      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 ADLs Comments: independent        OT Problem List: Decreased range of motion;Decreased activity tolerance;Impaired balance (sitting and/or standing);Pain      OT Treatment/Interventions:      OT Goals(Current goals can be found in the care plan section) Acute Rehab OT  Goals Patient Stated Goal: to return home and return to PLOF OT Goal Formulation: With patient Time For Goal Achievement: 12/27/22 Potential to Achieve Goals: Good  OT Frequency:      Co-evaluation              AM-PAC OT "6 Clicks" Daily Activity     Outcome Measure Help from another person eating meals?: None Help from another person taking care of personal grooming?: A Little Help from another person toileting, which includes using toliet, bedpan, or urinal?: None Help from another person bathing (including washing, rinsing, drying)?: A Little Help from another person to put on and taking off regular upper body clothing?: A Little Help from another person to put on and taking off regular lower body clothing?: A Little 6 Click Score: 20   End of Session Equipment Utilized During Treatment: Rolling walker (2 wheels);Gait belt Nurse Communication: Mobility status  Activity Tolerance: Patient tolerated treatment well;No increased pain Patient left: in bed;with call bell/phone within reach;with bed alarm set  OT Visit Diagnosis: Unsteadiness on feet (R26.81);Other abnormalities of gait and mobility (R26.89);History of falling (Z91.81);Pain Pain - Right/Left: Right Pain - part of body: Leg  Time: 1100-1125 OT Time Calculation (min): 25 min Charges:  OT General Charges $OT Visit: 1 Visit OT Evaluation $OT Eval Low Complexity: 1 Low OT Treatments $Self Care/Home Management : 8-22 mins  [place] 12/13/2022, 11:43 AM

## 2022-12-14 ENCOUNTER — Encounter (HOSPITAL_COMMUNITY): Payer: Self-pay | Admitting: Student

## 2022-12-14 LAB — CBC
HCT: 31.2 % — ABNORMAL LOW (ref 36.0–46.0)
Hemoglobin: 9.6 g/dL — ABNORMAL LOW (ref 12.0–15.0)
MCH: 28.8 pg (ref 26.0–34.0)
MCHC: 30.8 g/dL (ref 30.0–36.0)
MCV: 93.7 fL (ref 80.0–100.0)
Platelets: 351 10*3/uL (ref 150–400)
RBC: 3.33 MIL/uL — ABNORMAL LOW (ref 3.87–5.11)
RDW: 16.9 % — ABNORMAL HIGH (ref 11.5–15.5)
WBC: 9.5 10*3/uL (ref 4.0–10.5)
nRBC: 0 % (ref 0.0–0.2)

## 2022-12-14 MED ORDER — VITAMIN D 25 MCG (1000 UNIT) PO TABS
1000.0000 [IU] | ORAL_TABLET | Freq: Every day | ORAL | Status: DC
  Administered 2022-12-14 – 2022-12-15 (×2): 1000 [IU] via ORAL
  Filled 2022-12-14 (×2): qty 1

## 2022-12-14 MED ORDER — METHOCARBAMOL 500 MG PO TABS: 500.0000 mg | ORAL_TABLET | Freq: Four times a day (QID) | ORAL | 0 refills | Status: DC | PRN

## 2022-12-14 MED ORDER — VITAMIN D 25 MCG (1000 UNIT) PO TABS: 1000.0000 [IU] | ORAL_TABLET | Freq: Every day | ORAL | 0 refills | Status: AC

## 2022-12-14 MED ORDER — HYDROCODONE-ACETAMINOPHEN 5-325 MG PO TABS: 1.0000 | ORAL_TABLET | ORAL | Status: DC | PRN

## 2022-12-14 NOTE — Progress Notes (Signed)
Orthopaedic Trauma Progress Note  SUBJECTIVE: Doing well today. Pain controlled.  Mobilized well with therapies yesterday.  No chest pain. No SOB. No nausea/vomiting. No other complaints.  Asking when she can shower. Feels she would benefit from 1 additional day of therapy prior to discharging home.  OBJECTIVE:  Vitals:   12/14/22 0416 12/14/22 0809  BP: 123/68 123/73  Pulse: 77 77  Resp: 16 16  Temp: 98 F (36.7 C) 98.2 F (36.8 C)  SpO2: 92% 94%    General: Sitting up in bed, no acute distress. Alert and oriented x 4 Respiratory: No increased work of breathing.  Right lower extremity: Dressing changed, incisions are clean, dry, intact.  Tenderness about the knee as expected.  No significant calf tenderness.  Compartments soft and compressible.  Ankle DF/PF intact.  Endorses sensation over all aspects of the foot. Toes warm and well-perfused.  2+ DP pulse  IMAGING: Stable post op imaging.   LABS:  Results for orders placed or performed during the hospital encounter of 12/12/22 (from the past 24 hour(s))  CBC     Status: Abnormal   Collection Time: 12/14/22  3:16 AM  Result Value Ref Range   WBC 9.5 4.0 - 10.5 K/uL   RBC 3.33 (L) 3.87 - 5.11 MIL/uL   Hemoglobin 9.6 (L) 12.0 - 15.0 g/dL   HCT 31.2 (L) 36.0 - 46.0 %   MCV 93.7 80.0 - 100.0 fL   MCH 28.8 26.0 - 34.0 pg   MCHC 30.8 30.0 - 36.0 g/dL   RDW 16.9 (H) 11.5 - 15.5 %   Platelets 351 150 - 400 K/uL   nRBC 0.0 0.0 - 0.2 %    ASSESSMENT: Pamela Henson is a 72 y.o. female, 2 Days Post-Op s/p OPEN REDUCTION INTERNAL FIXATION RIGHT TIBIAL PLATEAU  CV/Blood loss: Acute blood loss anemia, Hgb 9.6 this morning. Stable. Hemodynamically stable  PLAN: Weightbearing: NWB RLE ROM: Unrestricted knee ROM Incisional and dressing care: Reinforce dressings as needed  Showering: Ok to shower today, keep incisions covered Orthopedic device(s): None  Pain management:  1. Tylenol 1000 mg q 6 hours scheduled 2. Robaxin 500 mg q 6  hours PRN 3. Oxycodone 5-10 mg q 4 hours PRN 4. Gabapentin 100 mg 3 times daily VTE prophylaxis: No chemical prophylaxis due to previous diverticulosis bleed.  SCDs ID:  Ancef 2gm post op completed Foley/Lines:  No foley, KVO IVFs Impediments to Fracture Healing: Vitamin D level 27, start supplementation  Dispo: PT/OT recommending HH. TOC following for DME needs and arrangement of [place] agency. Plan to discharge patient home tomorrow 12/15/22.  D/C recommendations: - Oxycodone for pain control - Continue Vit D supplementation x 90 days  Follow - up plan: 2 weeks after discharge for wound check and repeat x-rays   Contact information:  Katha Hamming MD, Rushie Nyhan PA-C. After hours and holidays please check [email or web address] for group call information for [facility]   Gwinda Passe, PA-C [phone] (office) [email or web address]

## 2022-12-14 NOTE — Progress Notes (Signed)
Mobility Specialist Progress Note   12/14/22 1130  Mobility  Activity Ambulated with assistance in hallway  Level of Assistance Contact guard assist, steadying assist  Assistive Device Front wheel walker  Distance Ambulated (ft) 60 ft (30 + 30)  Range of Motion/Exercises Active;All extremities  RLE Weight Bearing NWB  Activity Response Tolerated well   Pre Ambulation: SpO2 90% RA During Ambulation: SpO2 91-94% RA Post Ambulation:SpO2 93% RA  Patient received in supine and agreeable to participate. Progressing well with mobility, was able to extend ambulatory distance. Prior to ambulation, noticeable swelling noted to RLE, patient reported minimal pain. Extremity was elevated and ice was applied post ambulation as an intervention. Completed bed mobility independently. Stood with supervision + verbal cues for hand placement. Ambulated with min guard-supervision for safety and slow steady hop-to gait. Required seated rest x1 secondary to UE fatigue. Tolerated ambulation well without need for supplemental oxygen, saturated >91% on RA throughout session. Returned to room without complaint or incident. Was left in supine with all needs met, call bell in reach.   Pamela Henson, BS EXP Mobility Specialist Please contact via SecureChat or Rehab office at [phone]

## 2022-12-14 NOTE — Progress Notes (Signed)
Pt DME took so long to arrive that her ride left. Unable to leave today as pt has no ride, even if equipment gets here.  AC notified that pt will be leaving tomorrow instead.

## 2022-12-14 NOTE — Progress Notes (Deleted)
Pt refused to wait for dme prior to discharge.

## 2022-12-14 NOTE — Discharge Instructions (Signed)
[facility]uma Service Discharge Instructions   General Discharge Instructions  WEIGHT BEARING STATUS:Non-weightbearing right lower extremity  RANGE OF MOTION/ACTIVITY: Ok for knee and ankle range of motion as tolerated  Wound Care: You may remove your surgical dressing on post-op day #3, (Saturday 12/15/22). Incisions can be left open to air if there is no drainage. Once the incision is completely dry and without drainage, it may be left open to air out.  If there is no drainage from your incisions you may begin getting your incisions wet in the shower starting Satruday 12/15/22.  Clean incision gently with soap and water.  DVT/PE prophylaxis: None  Diet: as you were eating previously.  Can use over the counter stool softeners and bowel preparations, such as Miralax, to help with bowel movements.  Narcotics can be constipating.  Be sure to drink plenty of fluids  PAIN MEDICATION USE AND EXPECTATIONS  You have likely been given narcotic medications to help control your pain.  After a traumatic event that results in an fracture (broken bone) with or without surgery, it is ok to use narcotic pain medications to help control one's pain.  We understand that everyone responds to pain differently and each individual patient will be evaluated on a regular basis for the continued need for narcotic medications. Ideally, narcotic medication use should last no more than 6-8 weeks (coinciding with fracture healing).   As a patient it is your responsibility as well to monitor narcotic medication use and report the amount and frequency you use these medications when you come to your office visit.   We would also advise that if you are using narcotic medications, you should take a dose prior to therapy to maximize you participation.  IF YOU ARE ON NARCOTIC MEDICATIONS IT IS NOT PERMISSIBLE TO OPERATE A MOTOR VEHICLE (MOTORCYCLE/CAR/TRUCK/MOPED) OR HEAVY MACHINERY DO NOT MIX NARCOTICS WITH OTHER CNS  (CENTRAL NERVOUS SYSTEM) DEPRESSANTS SUCH AS ALCOHOL   STOP SMOKING OR USING NICOTINE PRODUCTS!!!!  As discussed nicotine severely impairs your body's ability to heal surgical and traumatic wounds but also impairs bone healing.  Wounds and bone heal by forming microscopic blood vessels (angiogenesis) and nicotine is a vasoconstrictor (essentially, shrinks blood vessels).  Therefore, if vasoconstriction occurs to these microscopic blood vessels they essentially disappear and are unable to deliver necessary nutrients to the healing tissue.  This is one modifiable factor that you can do to dramatically increase your chances of healing your injury.    (This means no smoking, no nicotine gum, patches, etc)  DO NOT USE NONSTEROIDAL ANTI-INFLAMMATORY DRUGS (NSAID'S)  Using products such as Advil (ibuprofen), Aleve (naproxen), Motrin (ibuprofen) for additional pain control during fracture healing can delay and/or prevent the healing response.  If you would like to take over the counter (OTC) medication, Tylenol (acetaminophen) is ok.  However, some narcotic medications that are given for pain control contain acetaminophen as well. Therefore, you should not exceed more than 4000 mg of tylenol in a day if you do not have liver disease.  Also note that there are may OTC medicines, such as cold medicines and allergy medicines that my contain tylenol as well.  If you have any questions about medications and/or interactions please ask your doctor/PA or your pharmacist.      ICE AND ELEVATE INJURED/OPERATIVE EXTREMITY  Using ice and elevating the injured extremity above your heart can help with swelling and pain control.  Icing in a pulsatile fashion, such as 20 minutes on and 20  minutes off, can be followed.    Do not place ice directly on skin. Make sure there is a barrier between to skin and the ice pack.    Using frozen items such as frozen peas works well as the conform nicely to the are that needs to be  iced.  USE AN ACE WRAP OR TED HOSE FOR SWELLING CONTROL  In addition to icing and elevation, Ace wraps or TED hose are used to help limit and resolve swelling.  It is recommended to use Ace wraps or TED hose until you are informed to stop.    When using Ace Wraps start the wrapping distally (farthest away from the body) and wrap proximally (closer to the body)   Example: If you had surgery on your leg or thing and you do not have a splint on, start the ace wrap at the toes and work your way up to the thigh        If you had surgery on your upper extremity and do not have a splint on, start the ace wrap at your fingers and work your way up to the upper arm  [place]: [phone]   VISIT OUR WEBSITE FOR ADDITIONAL INFORMATION: [email or web address]    Discharge Wound Care Instructions  Do NOT apply any ointments, solutions or lotions to pin sites or surgical wounds.  These prevent needed drainage and even though solutions like hydrogen peroxide kill bacteria, they also damage cells lining the pin sites that help fight infection.  Applying lotions or ointments can keep the wounds moist and can cause them to breakdown and open up as well. This can increase the risk for infection. When in doubt call the office.   If any drainage is noted, use one layer of adaptic or Mepitel, then gauze, Kerlix, and an ace wrap. - These dressing supplies should be available at local medical supply stores [facility], [facility], etc) as well as Management consultant ([facility], [facility], [place], etc)  Once the incision is completely dry and without drainage, it may be left open to air out.  Showering may begin 36-48 hours later.  Cleaning gently with soap and water.

## 2022-12-14 NOTE — Progress Notes (Signed)
Physical Therapy Treatment Patient Details Name: Pamela Henson MRN: [id number] DOB: 18-Jun-1951 Today's Date: 12/14/2022   History of Present Illness Pt is 72 yo female presenting for planned surgery of the RLE after sustaining a fall on 12/04/22. Pt is currently post op ORIF on 12/12/22 of the R bicondylar tibial plateau. PMH: Bipolar disorder and hsterectomy.    PT Comments    Pt was received in supine and agreeable to session. Pt was able to tolerate increased gait distance this session, however continued to be limited by fatigue. Pt was able to maintain RLE NWB throughout session without cues. Discussed navigating home set up with pt and educated on activity pacing for safety. Pt encouraged to have supervision as much as possible for safety once discharged. Pt continues to benefit from PT services to progress toward functional mobility goals.     Recommendations for follow up therapy are one component of a multi-disciplinary discharge planning process, led by the attending physician.  Recommendations may be updated based on patient status, additional functional criteria and insurance authorization.  Follow Up Recommendations  Home health PT     Assistance Recommended at Discharge Intermittent Supervision/Assistance  Patient can return home with the following A little help with walking and/or transfers;Help with stairs or ramp for entrance;Assist for transportation;Assistance with cooking/housework   Equipment Recommendations  Rolling walker (2 wheels);BSC/3in1;Wheelchair cushion (measurements PT);Wheelchair (measurements PT)    Recommendations for Other Services       Precautions / Restrictions Precautions Precautions: Fall Restrictions Weight Bearing Restrictions: Yes RLE Weight Bearing: Non weight bearing     Mobility  Bed Mobility Overal bed mobility: Modified Independent             General bed mobility comments: Pt using BUEs to advance RLE    Transfers Overall  transfer level: Needs assistance Equipment used: Rolling walker (2 wheels) Transfers: Sit to/from Stand Sit to Stand: Supervision           General transfer comment: from EOB with supervision for safety    Ambulation/Gait Ambulation/Gait assistance: Supervision Gait Distance (Feet): 60 Feet Assistive device: Rolling walker (2 wheels) Gait Pattern/deviations: Step-to pattern, Antalgic       General Gait Details: Pt performing hop-to pattern to maintain RLE NWB with frequent standing rest breaks due to fatigue. Supervision for safety.     Balance Overall balance assessment: Needs assistance Sitting-balance support: No upper extremity supported, Feet supported Sitting balance-Leahy Scale: Normal     Standing balance support: Single extremity supported, During functional activity, Reliant on assistive device for balance Standing balance-Leahy Scale: Poor Standing balance comment: reliant on RW support to maintain RLE NWB                            Cognition Arousal/Alertness: Awake/alert Behavior During Therapy: WFL for tasks assessed/performed Overall Cognitive Status: Within Functional Limits for tasks assessed                                          Exercises      General Comments        Pertinent Vitals/Pain Pain Assessment Pain Score: 2  Pain Location: RLE Pain Descriptors / Indicators: Aching, Sore Pain Intervention(s): Limited activity within patient's tolerance, Monitored during session, Repositioned     PT Goals (current goals can now be found in the care plan  section) Acute Rehab PT Goals Patient Stated Goal: To go home if possible but to be safe. PT Goal Formulation: With patient Time For Goal Achievement: 12/26/22 Potential to Achieve Goals: Good Progress towards PT goals: Progressing toward goals    Frequency    Min 5X/week      PT Plan Current plan remains appropriate       AM-PAC PT "6 Clicks"  Mobility   Outcome Measure  Help needed turning from your back to your side while in a flat bed without using bedrails?: None Help needed moving from lying on your back to sitting on the side of a flat bed without using bedrails?: None Help needed moving to and from a bed to a chair (including a wheelchair)?: A Little Help needed standing up from a chair using your arms (e.g., wheelchair or bedside chair)?: A Little Help needed to walk in hospital room?: A Little Help needed climbing 3-5 steps with a railing? : A Lot 6 Click Score: 19    End of Session Equipment Utilized During Treatment: Gait belt Activity Tolerance: Patient tolerated treatment well Patient left: with call bell/phone within reach;in bed Nurse Communication: Mobility status PT Visit Diagnosis: Unsteadiness on feet (R26.81);Other abnormalities of gait and mobility (R26.89)     Time: [id number] PT Time Calculation (min) (ACUTE ONLY): 32 min  Charges:  $Gait Training: 23-37 mins                     Michelle Nasuti, PTA Acute Rehabilitation Services Secure Chat Preferred  [phone]    Michelle Nasuti 12/14/2022, 2:58 PM

## 2022-12-14 NOTE — Progress Notes (Cosign Needed)
    Durable Medical Equipment  (From admission, onward)           Start     Ordered   12/14/22 1358  For home use only DME Bedside commode  Once       Comments: Confine to one room  Question:  Patient needs a bedside commode to treat with the following condition  Answer:  Fx   12/14/22 1358   12/13/22 1113  For home use only DME lightweight manual wheelchair with seat cushion  Once       Comments: Patient suffers from ORIF R bicondylar tibial plateau which impairs their ability to perform daily activities like dressing, bathing  in the home.  A walker will not resolve  issue with performing activities of daily living. A wheelchair will allow patient to safely perform daily activities. Patient is not able to propel themselves in the home using a standard weight wheelchair due to general weakness. Patient can self propel in the lightweight wheelchair. Length of need 6 months . Accessories: elevating leg rests (ELRs), wheel locks, extensions and anti-tippers.   12/13/22 1115   12/13/22 1112  For home use only DME Walker rolling  Once       Question Answer Comment  Walker: With [place]   Patient needs a walker to treat with the following condition Fx      12/13/22 1115

## 2022-12-14 NOTE — Anesthesia Postprocedure Evaluation (Signed)
Anesthesia Post Note  Patient: Pamela Henson  Procedure(s) Performed: OPEN REDUCTION INTERNAL FIXATION (ORIF) TIBIAL PLATEAU (Right)     Patient location during evaluation: PACU Anesthesia Type: General Level of consciousness: sedated and patient cooperative Pain management: pain level controlled Vital Signs Assessment: post-procedure vital signs reviewed and stable Respiratory status: spontaneous breathing Cardiovascular status: stable Anesthetic complications: no   No notable events documented.  Last Vitals:  Vitals:   12/14/22 1145 12/14/22 2015  BP: 111/61 129/67  Pulse: 65 81  Resp: 16 18  Temp: 36.8 C 36.8 C  SpO2: 96% 92%    Last Pain:  Vitals:   12/14/22 2015  TempSrc: Oral  PainSc:                  Nolon Nations

## 2022-12-14 NOTE — TOC Transition Note (Addendum)
Transition of Care [facility]) - CM/SW Discharge Note   Patient Details  Name: Pamela Henson MRN: [id number] Date of Birth: 1951-09-01  [facility] CM/SW Contact:  Sharin Mons, RN Phone Number: 12/14/2022, 2:03 PM   Clinical Narrative:    Patient will DC to: home Anticipated DC date: 12/14/2022 Family notified: yes Transport by:car  Per MD patient ready for DC today. RN, patient, patient's family, and [facility]  notified of DC.  Referral made with Kristin/ [employer] for DME: RW, W/C,BSC. Equipment will be delivered to bedside prior to d/c. Pt without RX  MED concerns. Post hospital f/u noted on AVS. Family to provide transportation to home.  RNCM will sign off for now as intervention is no longer needed. Please consult [place] again if new needs arise.    12/14/2022 1530  Providers initial d/c order written for 3/23. Pt was told by staff d/c was for today. Pt made arrangement with family to pick her up. Provider made aware and order change for today. Cleared by PT. Home health services to follow.  Final next level of care: [place] Barriers to Discharge: Continued Medical Work up   Patient Goals and CMS Choice      Discharge Placement                         Discharge Plan and Services Additional resources added to the After Visit Summary for     Discharge Planning Services: CM Consult            DME Arranged: Bedside commode, Walker rolling, Wheelchair manual DME Agency: [employer]       HH Arranged: PT, OT HH Agency: [place] Date [place]: 12/13/22   Representative spoke with at [place]: [place] Determinants of Health ([place]) Interventions SDOH Screenings   Food Insecurity: No Food Insecurity (10/17/2022)  Housing: Low Risk  (10/17/2022)  Transportation Needs: No Transportation Needs (10/17/2022)  Utilities: Not At Risk (10/13/2022)  Alcohol Screen: Low Risk  (10/17/2022)   Depression (PHQ2-9): Low Risk  (10/17/2022)  Tobacco Use: Medium Risk (12/14/2022)     Readmission Risk Interventions     No data to display

## 2022-12-15 NOTE — Progress Notes (Signed)
Patient alert and oriented, verbalized understanding of dc instructions. All dc papers and belongings given to patient.

## 2022-12-15 NOTE — Progress Notes (Signed)
Physical Therapy Treatment Patient Details Name: Pamela Henson MRN: [id number] DOB: [birth year] Today's Date: 12/15/2022   History of Present Illness Pt is 72 yo female presenting for planned surgery of the RLE after sustaining a fall on 12/04/22. Pt is currently post op ORIF on 12/12/22 of the R bicondylar tibial plateau. PMH: Bipolar disorder and hsterectomy.    PT Comments    Pt likely to d/c today, educated on use of w/c and pt able to teach back after several trials of relearning. Pt report she has had STM deficits since colonoscopy in 1/24. Wrote instructions for her on there ex sheet so she would have extra input. Reviewed HEP in sitting, supine, and standing. Pt practiced ambulation within room with RW she will take home. Noted swelling R foot, discussed frequency of ice and elevation. Also taught pt how to use gait belt as strap to assist in moving RLE as pt still having difficulty moving it against gravity. Pt is going to see if someone can stay at home with her forst 1-2 night if possible but notes that her cousin cares for her husband who has cancer.  Pt ready for d/c home later today.    Recommendations for follow up therapy are one component of a multi-disciplinary discharge planning process, led by the attending physician.  Recommendations may be updated based on patient status, additional functional criteria and insurance authorization.  Follow Up Recommendations  Home health PT     Assistance Recommended at Discharge Intermittent Supervision/Assistance  Patient can return home with the following A little help with walking and/or transfers;Help with stairs or ramp for entrance;Assist for transportation;Assistance with cooking/housework   Equipment Recommendations  Rolling walker (2 wheels);BSC/3in1;Wheelchair cushion (measurements PT);Wheelchair (measurements PT)    Recommendations for Other Services       Precautions / Restrictions Precautions Precautions:  Fall Restrictions Weight Bearing Restrictions: Yes RLE Weight Bearing: Non weight bearing     Mobility  Bed Mobility Overal bed mobility: Modified Independent             General bed mobility comments: Pt using BUEs to advance RLE, practiced also using gait belt as strap to assist with moving RLE    Transfers Overall transfer level: Needs assistance Equipment used: Rolling walker (2 wheels) Transfers: Sit to/from Stand Sit to Stand: Modified independent (Device/Increase time) Stand pivot transfers: Modified independent (Device/Increase time)         General transfer comment: min vc's for safe practices    Ambulation/Gait Ambulation/Gait assistance: Supervision Gait Distance (Feet): 20 Feet Assistive device: Rolling walker (2 wheels) Gait Pattern/deviations: Step-to pattern Gait velocity: decreased Gait velocity interpretation: <1.8 ft/sec, indicate of risk for recurrent falls   General Gait Details: Pt performing hop-to pattern to maintain RLE NWB. Worked on using UE's more to swing LLE through to perform less hopping for less jarring B LE's. Pt reports this is more comfortable   Theme park manager mobility: Yes Wheelchair propulsion: Both upper extremities Wheelchair parts: Needs assistance Distance: 20 Wheelchair Assistance Details (indicate cue type and reason): reviewed all parts of w/c and how they work, then had pt sit in w/c and demo teach back. Pt able to achieve after several trials and reteaching. Practiced turning in tight spaces. Practiced getting RLE on and off leg rest  Modified Rankin (Stroke Patients Only)       Balance Overall balance assessment: Needs assistance Sitting-balance support: No  upper extremity supported, Feet supported Sitting balance-Leahy Scale: Normal     Standing balance support: Single extremity supported, During functional activity, Reliant on assistive device for  balance Standing balance-Leahy Scale: Poor Standing balance comment: reliant on RW support to maintain RLE NWB                            Cognition Arousal/Alertness: Awake/alert Behavior During Therapy: WFL for tasks assessed/performed Overall Cognitive Status: Within Functional Limits for tasks assessed                                 General Comments: WFL for basic conversation but noted some STM deficits with decreased carryover from previous visits and pt states she has had STM deficits since colonoscopy 1/24. Wrote down instructions for her on exercise handout for compensation        Exercises General Exercises - Lower Extremity Ankle Circles/Pumps: AROM, Both, 10 reps, Seated Quad Sets: AROM, Right, 10 reps, Seated Long Arc Quad: Right, AAROM, 10 reps, Seated Heel Slides:  (reviewed for in bed) Hip ABduction/ADduction:  (reviewed in standing) Hip Flexion/Marching:  (reviewed in standing)    General Comments General comments (skin integrity, edema, etc.): reviewed HEP, activity level at home, ice, elevation, and preference to have somone stay with her first 2 nights if available      Pertinent Vitals/Pain Pain Assessment Pain Assessment: 0-10 Pain Score: 2  Pain Location: RLE Pain Descriptors / Indicators: Aching, Sore Pain Intervention(s): Limited activity within patient's tolerance, Monitored during session    Home Living                          Prior Function            PT Goals (current goals can now be found in the care plan section) Acute Rehab PT Goals Patient Stated Goal: To go home if possible but to be safe. PT Goal Formulation: With patient Time For Goal Achievement: 12/26/22 Potential to Achieve Goals: Good Progress towards PT goals: Progressing toward goals    Frequency    Min 5X/week      PT Plan Current plan remains appropriate    Co-evaluation              AM-PAC PT "6 Clicks" Mobility    Outcome Measure  Help needed turning from your back to your side while in a flat bed without using bedrails?: None Help needed moving from lying on your back to sitting on the side of a flat bed without using bedrails?: None Help needed moving to and from a bed to a chair (including a wheelchair)?: A Little Help needed standing up from a chair using your arms (e.g., wheelchair or bedside chair)?: A Little Help needed to walk in hospital room?: A Little Help needed climbing 3-5 steps with a railing? : A Lot 6 Click Score: 19    End of Session   Activity Tolerance: Patient tolerated treatment well Patient left: with call bell/phone within reach;in chair Nurse Communication: Mobility status PT Visit Diagnosis: Unsteadiness on feet (R26.81);Other abnormalities of gait and mobility (R26.89)     Time: [id number] PT Time Calculation (min) (ACUTE ONLY): 51 min  Charges:  $Gait Training: 8-22 mins $Therapeutic Exercise: 23-37 mins $Self Care/Home Management: 8-22  Leighton Roach, PT  Acute Rehab Services Secure chat preferred Office [place] 12/15/2022, 10:23 AM

## 2022-12-15 NOTE — Plan of Care (Signed)

## 2022-12-16 DIAGNOSIS — D62 Acute posthemorrhagic anemia: Secondary | ICD-10-CM | POA: Diagnosis not present

## 2022-12-16 DIAGNOSIS — Z9181 History of falling: Secondary | ICD-10-CM | POA: Diagnosis not present

## 2022-12-16 DIAGNOSIS — F319 Bipolar disorder, unspecified: Secondary | ICD-10-CM | POA: Diagnosis not present

## 2022-12-16 DIAGNOSIS — Z87891 Personal history of nicotine dependence: Secondary | ICD-10-CM | POA: Diagnosis not present

## 2022-12-16 DIAGNOSIS — S82141D Displaced bicondylar fracture of right tibia, subsequent encounter for closed fracture with routine healing: Secondary | ICD-10-CM | POA: Diagnosis not present

## 2022-12-17 ENCOUNTER — Telehealth: Payer: Self-pay

## 2022-12-17 ENCOUNTER — Ambulatory Visit: Payer: Medicare Other | Admitting: Family Medicine

## 2022-12-17 DIAGNOSIS — S82141D Displaced bicondylar fracture of right tibia, subsequent encounter for closed fracture with routine healing: Secondary | ICD-10-CM | POA: Diagnosis not present

## 2022-12-17 DIAGNOSIS — Z87891 Personal history of nicotine dependence: Secondary | ICD-10-CM | POA: Diagnosis not present

## 2022-12-17 DIAGNOSIS — Z9181 History of falling: Secondary | ICD-10-CM | POA: Diagnosis not present

## 2022-12-17 DIAGNOSIS — D62 Acute posthemorrhagic anemia: Secondary | ICD-10-CM | POA: Diagnosis not present

## 2022-12-17 DIAGNOSIS — F319 Bipolar disorder, unspecified: Secondary | ICD-10-CM | POA: Diagnosis not present

## 2022-12-17 NOTE — Telephone Encounter (Signed)
Copied from [place] [id number]. Topic: General - Other >> Dec 17, 2022  9:34 AM Pamela Henson wrote: Reason for CRM: Patient is bed ridden so can't come in for an appt, but she is asking for her oxygen tank to be picked up because she doesn't need it anymore,hasn't used in a month, her oxyen has been 93-96.

## 2022-12-17 NOTE — Telephone Encounter (Signed)
Patient was advised to call the company providing her with the oxygen to pick it up.  If they need an order to discontinue the O2 she will need to ask the provider who ordered it.  If this was the hospitalist when she was inpatient she will let us know and we can then see if Dr Caryn Section can do an order.

## 2022-12-17 NOTE — Discharge Summary (Signed)
Orthopaedic Trauma Service (OTS) Discharge Summary   Patient ID: Pamela Henson MRN: [id number] DOB/AGE: August 18, 1951 72 y.o.  Admit date: 12/12/2022 Discharge date: 12/15/2022  Admission Diagnoses: Right tibial plateau fracture  Discharge Diagnoses:  Principal Problem:   Closed fracture of right tibial plateau   Past Medical History:  Diagnosis Date   Anemia    Bipolar 1 disorder ([place])    Bipolar 1 disorder ([place])    Complication of anesthesia    Headache    History of chicken pox    History of measles    History of mumps    PONV (postoperative nausea and vomiting)    Urine retention    takes Flomax     Procedures Performed: ORIF right tibial plateau  Discharged Condition: good/stable  Hospital Course: Patient presented to [facility] on 12/12/2022 for scheduled procedure on the right lower extremity.  Was taken to the operating room by Dr. Doreatha Martin on 12/12/2022 for the above procedure.  She tolerated well without complications.  Instructed to be nonweightbearing on the right lower extremity postoperatively.  Was allowed unrestricted range of motion of the right knee and hip.  Was admitted for pain control and therapies.  Patient was started with SCDs immediately after surgery for mechanical DVT prophylaxis.  Chemical DVT prophylaxis was contraindicated due to previous diverticulosis bleed.  Patient began working with physical and Occupational Therapy starting on postoperative day #1.  Remainder of patient's hospitalization was dedicated to achieving adequate pain control and increasing mobility in order for patient to safely return home. On 12/15/2022, the patient was tolerating diet, working well with therapies, pain well controlled, vital signs stable, dressings clean, dry, intact and felt stable for discharge to home. Patient will follow up as below and knows to call with questions or concerns.     Consults: None  Significant Diagnostic Studies:   Results  for orders placed or performed during the hospital encounter of 12/12/22 (from the past 168 hour(s))  Basic metabolic panel   Collection Time: 12/12/22  9:49 AM  Result Value Ref Range   Sodium 139 135 - 145 mmol/L   Potassium 4.3 3.5 - 5.1 mmol/L   Chloride 106 98 - 111 mmol/L   CO2 22 22 - 32 mmol/L   Glucose, Bld 108 (H) 70 - 99 mg/dL   BUN 19 8 - 23 mg/dL   Creatinine, Ser 0.67 0.44 - 1.00 mg/dL   Calcium 8.8 (L) 8.9 - 10.3 mg/dL   GFR, Estimated >60 >60 mL/min   Anion gap 11 5 - 15  CBC WITH DIFFERENTIAL   Collection Time: 12/12/22  9:49 AM  Result Value Ref Range   WBC 10.3 4.0 - 10.5 K/uL   RBC 3.92 3.87 - 5.11 MIL/uL   Hemoglobin 11.4 (L) 12.0 - 15.0 g/dL   HCT 37.7 36.0 - 46.0 %   MCV 96.2 80.0 - 100.0 fL   MCH 29.1 26.0 - 34.0 pg   MCHC 30.2 30.0 - 36.0 g/dL   RDW 17.0 (H) 11.5 - 15.5 %   Platelets 347 150 - 400 K/uL   nRBC 0.0 0.0 - 0.2 %   Neutrophils Relative % 74 %   Neutro Abs 7.6 1.7 - 7.7 K/uL   Lymphocytes Relative 17 %   Lymphs Abs 1.8 0.7 - 4.0 K/uL   Monocytes Relative 7 %   Monocytes Absolute 0.7 0.1 - 1.0 K/uL   Eosinophils Relative 1 %   Eosinophils Absolute 0.1 0.0 - 0.5 K/uL  Basophils Relative 1 %   Basophils Absolute 0.1 0.0 - 0.1 K/uL   Immature Granulocytes 0 %   Abs Immature Granulocytes 0.02 0.00 - 0.07 K/uL  Basic metabolic panel   Collection Time: 12/13/22  2:22 AM  Result Value Ref Range   Sodium 139 135 - 145 mmol/L   Potassium 4.3 3.5 - 5.1 mmol/L   Chloride 107 98 - 111 mmol/L   CO2 27 22 - 32 mmol/L   Glucose, Bld 110 (H) 70 - 99 mg/dL   BUN 21 8 - 23 mg/dL   Creatinine, Ser 0.71 0.44 - 1.00 mg/dL   Calcium 8.2 (L) 8.9 - 10.3 mg/dL   GFR, Estimated >60 >60 mL/min   Anion gap 5 5 - 15  CBC   Collection Time: 12/13/22  6:20 AM  Result Value Ref Range   WBC 10.0 4.0 - 10.5 K/uL   RBC 3.36 (L) 3.87 - 5.11 MIL/uL   Hemoglobin 9.6 (L) 12.0 - 15.0 g/dL   HCT 31.9 (L) 36.0 - 46.0 %   MCV 94.9 80.0 - 100.0 fL   MCH 28.6 26.0 -  34.0 pg   MCHC 30.1 30.0 - 36.0 g/dL   RDW 16.9 (H) 11.5 - 15.5 %   Platelets 329 150 - 400 K/uL   nRBC 0.0 0.0 - 0.2 %  VITAMIN D 25 Hydroxy (Vit-D Deficiency, Fractures)   Collection Time: 12/13/22  6:20 AM  Result Value Ref Range   Vit D, 25-Hydroxy 27.26 (L) 30 - 100 ng/mL  CBC   Collection Time: 12/14/22  3:16 AM  Result Value Ref Range   WBC 9.5 4.0 - 10.5 K/uL   RBC 3.33 (L) 3.87 - 5.11 MIL/uL   Hemoglobin 9.6 (L) 12.0 - 15.0 g/dL   HCT 31.2 (L) 36.0 - 46.0 %   MCV 93.7 80.0 - 100.0 fL   MCH 28.8 26.0 - 34.0 pg   MCHC 30.8 30.0 - 36.0 g/dL   RDW 16.9 (H) 11.5 - 15.5 %   Platelets 351 150 - 400 K/uL   nRBC 0.0 0.0 - 0.2 %     Treatments: IV hydration, antibiotics: Ancef, analgesia: acetaminophen, Dilaudid, and oxycodone, therapies: PT and OT, and surgery: As above  Discharge Exam: General: Sitting up in bed, no acute distress. Alert and oriented x 4 Respiratory: No increased work of breathing.  Right lower extremity: Dressing changed, incisions are clean, dry, intact.  Tenderness about the knee as expected.  No significant calf tenderness.  Compartments soft and compressible.  Ankle DF/PF intact.  Endorses sensation over all aspects of the foot. Toes warm and well-perfused.  2+ DP pulse    Disposition: Discharge disposition: 06-Home-Health Care Svc       Discharge Instructions     Call MD / Call 911   Complete by: As directed    If you experience chest pain or shortness of breath, CALL 911 and be transported to the hospital emergency room.  If you develope a fever above 101 F, pus (white drainage) or increased drainage or redness at the wound, or calf pain, call your surgeon's office.   Constipation Prevention   Complete by: As directed    Drink plenty of fluids.  Prune juice may be helpful.  You may use a stool softener, such as Colace (over the counter) 100 mg twice a day.  Use MiraLax (over the counter) for constipation as needed.   Diet - low sodium heart  healthy   Complete by: As directed  Increase activity slowly as tolerated   Complete by: As directed    Post-operative opioid taper instructions:   Complete by: As directed    POST-OPERATIVE OPIOID TAPER INSTRUCTIONS: It is important to wean off of your opioid medication as soon as possible. If you do not need pain medication after your surgery it is ok to stop day one. Opioids include: Codeine, Hydrocodone(Norco, Vicodin), Oxycodone(Percocet, oxycontin) and hydromorphone amongst others.  Long term and even short term use of opiods can cause: Increased pain response Dependence Constipation Depression Respiratory depression And more.  Withdrawal symptoms can include Flu like symptoms Nausea, vomiting And more Techniques to manage these symptoms Hydrate well Eat regular healthy meals Stay active Use relaxation techniques(deep breathing, meditating, yoga) Do Not substitute Alcohol to help with tapering If you have been on opioids for less than two weeks and do not have pain than it is ok to stop all together.  Plan to wean off of opioids This plan should start within one week post op of your joint replacement. Maintain the same interval or time between taking each dose and first decrease the dose.  Cut the total daily intake of opioids by one tablet each day Next start to increase the time between doses. The last dose that should be eliminated is the evening dose.         Allergies as of 12/15/2022       Reactions   Asa [aspirin] Other (See Comments)   Had GI bleed 09/2022        Medication List     STOP taking these medications    oxyCODONE 5 MG immediate release tablet Commonly known as: Roxicodone       TAKE these medications    acetaminophen 500 MG tablet Commonly known as: TYLENOL Take 500 mg by mouth every 6 (six) hours as needed for mild pain, moderate pain or headache.   cholecalciferol 25 MCG (1000 UNIT) tablet Commonly known as: VITAMIN  D3 Take 1 tablet (1,000 Units total) by mouth daily.   docusate sodium 100 MG capsule Commonly known as: Colace Take 1 capsule (100 mg total) by mouth 2 (two) times daily.   gabapentin 100 MG capsule Commonly known as: NEURONTIN Take 100 mg by mouth 3 (three) times daily.   HYDROcodone-acetaminophen 5-325 MG tablet Commonly known as: NORCO/VICODIN Take 1 tablet by mouth every 4 (four) hours as needed for moderate pain or severe pain.   methocarbamol 500 MG tablet Commonly known as: ROBAXIN Take 1 tablet (500 mg total) by mouth every 6 (six) hours as needed for muscle spasms.   multivitamin with minerals tablet Take 1 tablet by mouth daily.   polyethylene glycol 17 g packet Commonly known as: MiraLax Take 17 g by mouth daily.   sertraline 50 MG tablet Commonly known as: ZOLOFT Take 50 mg by mouth daily. hs   tamsulosin 0.4 MG Caps capsule Commonly known as: FLOMAX Take 1 capsule (0.4 mg total) by mouth daily. What changed: when to take this        Follow-up Information     Haddix, Thomasene Lot, MD. Go on 12/24/2022.   Specialty: Orthopedic Surgery Why: for wound check, repeat x-rays Contact information: [place] [address] [phone]         Birdie Sons, MD Follow up.   Specialty: Family Medicine Contact information: [address] [place] [address] [phone]         [facility] Follow  up.   Specialty: [place] Why: home health service will be provided by [facility], start of care with 48 hours post discharge Contact information: [place] [address] [phone]                 Discharge Instructions and Plan: Patient will be discharged to home with home health physical therapy. Will be discharged on no chemical DVT prophylaxis. Patient has been provided with all the necessary DME for discharge. Patient will follow up with Dr. Doreatha Martin in 2  weeks for repeat x-rays and wound check  Signed:  Gwinda Passe, PA-C ?([phone]? (phone) 12/17/2022, 9:51 AM  [facility] [place] [address] [phone] Domingo Sep (F)

## 2022-12-18 DIAGNOSIS — Z9181 History of falling: Secondary | ICD-10-CM | POA: Diagnosis not present

## 2022-12-18 DIAGNOSIS — S82141D Displaced bicondylar fracture of right tibia, subsequent encounter for closed fracture with routine healing: Secondary | ICD-10-CM | POA: Diagnosis not present

## 2022-12-18 DIAGNOSIS — D62 Acute posthemorrhagic anemia: Secondary | ICD-10-CM | POA: Diagnosis not present

## 2022-12-18 DIAGNOSIS — F319 Bipolar disorder, unspecified: Secondary | ICD-10-CM | POA: Diagnosis not present

## 2022-12-18 DIAGNOSIS — Z87891 Personal history of nicotine dependence: Secondary | ICD-10-CM | POA: Diagnosis not present

## 2022-12-19 ENCOUNTER — Other Ambulatory Visit (HOSPITAL_COMMUNITY): Payer: Self-pay | Admitting: Student

## 2022-12-19 ENCOUNTER — Ambulatory Visit (HOSPITAL_COMMUNITY)
Admission: RE | Admit: 2022-12-19 | Discharge: 2022-12-19 | Disposition: A | Discharge status: Home / Self Care | Payer: Medicare Other | Source: Ambulatory Visit | Attending: Student | Admitting: Student

## 2022-12-19 DIAGNOSIS — I82451 Acute embolism and thrombosis of right peroneal vein: Secondary | ICD-10-CM | POA: Diagnosis not present

## 2022-12-19 DIAGNOSIS — R2243 Localized swelling, mass and lump, lower limb, bilateral: Secondary | ICD-10-CM

## 2022-12-20 ENCOUNTER — Telehealth: Payer: Self-pay | Admitting: Family Medicine

## 2022-12-20 NOTE — Telephone Encounter (Signed)
Received call from orthopedic service yesterday that patient developed DVT after surgery. She needs to schedule follow up here around the middle of next week for DVT follow up. OK to use Sameday slot.

## 2022-12-21 DIAGNOSIS — S82141D Displaced bicondylar fracture of right tibia, subsequent encounter for closed fracture with routine healing: Secondary | ICD-10-CM | POA: Diagnosis not present

## 2022-12-21 DIAGNOSIS — F319 Bipolar disorder, unspecified: Secondary | ICD-10-CM | POA: Diagnosis not present

## 2022-12-21 DIAGNOSIS — Z87891 Personal history of nicotine dependence: Secondary | ICD-10-CM | POA: Diagnosis not present

## 2022-12-21 DIAGNOSIS — D62 Acute posthemorrhagic anemia: Secondary | ICD-10-CM | POA: Diagnosis not present

## 2022-12-21 DIAGNOSIS — Z9181 History of falling: Secondary | ICD-10-CM | POA: Diagnosis not present

## 2022-12-24 DIAGNOSIS — D62 Acute posthemorrhagic anemia: Secondary | ICD-10-CM | POA: Diagnosis not present

## 2022-12-24 DIAGNOSIS — S82121D Displaced fracture of lateral condyle of right tibia, subsequent encounter for closed fracture with routine healing: Secondary | ICD-10-CM | POA: Diagnosis not present

## 2022-12-24 DIAGNOSIS — F319 Bipolar disorder, unspecified: Secondary | ICD-10-CM | POA: Diagnosis not present

## 2022-12-24 DIAGNOSIS — S82141D Displaced bicondylar fracture of right tibia, subsequent encounter for closed fracture with routine healing: Secondary | ICD-10-CM | POA: Diagnosis not present

## 2022-12-24 DIAGNOSIS — Z87891 Personal history of nicotine dependence: Secondary | ICD-10-CM | POA: Diagnosis not present

## 2022-12-24 DIAGNOSIS — Z9181 History of falling: Secondary | ICD-10-CM | POA: Diagnosis not present

## 2022-12-25 ENCOUNTER — Ambulatory Visit (INDEPENDENT_AMBULATORY_CARE_PROVIDER_SITE_OTHER): Payer: Medicare Other | Admitting: Family Medicine

## 2022-12-25 VITALS — BP 110/64 | HR 65 | Temp 98.4°F

## 2022-12-25 DIAGNOSIS — I82811 Embolism and thrombosis of superficial veins of right lower extremities: Secondary | ICD-10-CM | POA: Diagnosis not present

## 2022-12-25 NOTE — Progress Notes (Signed)
      Established patient visit   Patient: Pamela Henson   DOB: 1951/03/26   72 y.o. Female  MRN: [id number] Visit Date: 12/25/2022  Today's healthcare provider: Lelon Huh, MD   No chief complaint on file.  Subjective    Patient presents for follow up DVT following ORIF tibial plateau fracture 12/12/2022. Venous ultrasound on 12/19/2022 was as follows.  1. Nonocclusive thrombus within the right greater saphenous vein, and occlusive thrombus within the right peroneal vein. 2. No evidence of deep venous thrombosis within the left lower extremity.  She was started on Eliquis by her orthopedist and reports swelling and pain in foot are much better.   Medications: Outpatient Medications Prior to Visit  Medication Sig   acetaminophen (TYLENOL) 500 MG tablet Take 500 mg by mouth every 6 (six) hours as needed for mild pain, moderate pain or headache.   cholecalciferol (VITAMIN D3) 25 MCG (1000 UNIT) tablet Take 1 tablet (1,000 Units total) by mouth daily.   docusate sodium (COLACE) 100 MG capsule Take 1 capsule (100 mg total) by mouth 2 (two) times daily. (Patient not taking: Reported on 12/10/2022)   gabapentin (NEURONTIN) 100 MG capsule Take 100 mg by mouth 3 (three) times daily.   HYDROcodone-acetaminophen (NORCO/VICODIN) 5-325 MG tablet Take 1 tablet by mouth every 4 (four) hours as needed for moderate pain or severe pain.   methocarbamol (ROBAXIN) 500 MG tablet Take 1 tablet (500 mg total) by mouth every 6 (six) hours as needed for muscle spasms.   Multiple Vitamins-Minerals (MULTIVITAMIN WITH MINERALS) tablet Take 1 tablet by mouth daily.   polyethylene glycol (MIRALAX) 17 g packet Take 17 g by mouth daily. (Patient not taking: Reported on 12/10/2022)   sertraline (ZOLOFT) 50 MG tablet Take 50 mg by mouth daily. hs   tamsulosin (FLOMAX) 0.4 MG CAPS capsule Take 1 capsule (0.4 mg total) by mouth daily. (Patient taking differently: Take 0.4 mg by mouth at bedtime.)   No  facility-administered medications prior to visit.    Review of Systems  Constitutional:  Negative for appetite change, chills, fatigue and fever.  Respiratory:  Negative for chest tightness and shortness of breath.   Cardiovascular:  Negative for chest pain and palpitations.  Gastrointestinal:  Negative for abdominal pain, nausea and vomiting.  Neurological:  Negative for dizziness and weakness.       Objective    BP 110/64 (BP Location: Left Arm, Patient Position: Sitting, Cuff Size: Normal)   Pulse 65   Temp 98.4 F (36.9 C) (Oral)   SpO2 93%    Physical Exam  Surgical wound clean dry, no drainage, no erythema, no swelling. No pain or swelling of right foot.   Assessment & Plan     1. Post op thrombosis of right saphenous vein Dx 3/27. On Eliquis 10mg  BID per orthopedics and about to change to 5mg  BID. There are no clear guidelines for treatment of thrombosis or greater saphenous vein, although there is a risk of progression to DVT or PE. She does have history of hospitalization for diverticular bleed in January. She will watch carefully for s/s of GI bleeding, avoid NSAIDs. Anticipate 1-3 mos DOAC if no complications. Follow up here in 4 weeks. Call if any s/s GI bleeding.           Lelon Huh, MD  [place] [phone] (phone) [phone] (fax)  [place]

## 2022-12-25 NOTE — Patient Instructions (Signed)
.   Please review the attached list of medications and notify my office if there are any errors.   . Please bring all of your medications to every appointment so we can make sure that our medication list is the same as yours.   

## 2022-12-25 NOTE — Progress Notes (Deleted)
      Established patient visit   Patient: Pamela Henson   DOB: 27-Mar-1951   72 y.o. Female  MRN: [id number] Visit Date: 12/25/2022  Today's healthcare provider: Lelon Huh, MD   No chief complaint on file.  Subjective    HPI  Patient is a 72 year female who presents for follow up of blood clot post surgery. Patient states she was placed on blood thinner 7 days ago.    Medications: Outpatient Medications Prior to Visit  Medication Sig   acetaminophen (TYLENOL) 500 MG tablet Take 500 mg by mouth every 6 (six) hours as needed for mild pain, moderate pain or headache.   cholecalciferol (VITAMIN D3) 25 MCG (1000 UNIT) tablet Take 1 tablet (1,000 Units total) by mouth daily.   docusate sodium (COLACE) 100 MG capsule Take 1 capsule (100 mg total) by mouth 2 (two) times daily. (Patient not taking: Reported on 12/10/2022)   gabapentin (NEURONTIN) 100 MG capsule Take 100 mg by mouth 3 (three) times daily.   HYDROcodone-acetaminophen (NORCO/VICODIN) 5-325 MG tablet Take 1 tablet by mouth every 4 (four) hours as needed for moderate pain or severe pain.   methocarbamol (ROBAXIN) 500 MG tablet Take 1 tablet (500 mg total) by mouth every 6 (six) hours as needed for muscle spasms.   Multiple Vitamins-Minerals (MULTIVITAMIN WITH MINERALS) tablet Take 1 tablet by mouth daily.   polyethylene glycol (MIRALAX) 17 g packet Take 17 g by mouth daily. (Patient not taking: Reported on 12/10/2022)   sertraline (ZOLOFT) 50 MG tablet Take 50 mg by mouth daily. hs   tamsulosin (FLOMAX) 0.4 MG CAPS capsule Take 1 capsule (0.4 mg total) by mouth daily. (Patient taking differently: Take 0.4 mg by mouth at bedtime.)   No facility-administered medications prior to visit.    Review of Systems  Respiratory:  Negative for shortness of breath and wheezing.   Cardiovascular:  Positive for leg swelling (just the leg with the DVT). Negative for chest pain and palpitations.    {Labs  Heme  Chem  Endocrine   Serology  Results Review (optional):23779}   Objective    BP 110/64 (BP Location: Left Arm, Patient Position: Sitting, Cuff Size: Normal)   Pulse 65   Temp 98.4 F (36.9 C) (Oral)   SpO2 93%  {Show previous vital signs (optional):23777}  Physical Exam  ***  No results found for any visits on 12/25/22.  Assessment & Plan     ***  No follow-ups on file.      {provider attestation***:1}   Lelon Huh, MD  [place] [phone] (phone) [phone] (fax)  [place]

## 2022-12-27 ENCOUNTER — Ambulatory Visit: Payer: Medicare Other | Admitting: Family Medicine

## 2022-12-31 DIAGNOSIS — Z9181 History of falling: Secondary | ICD-10-CM | POA: Diagnosis not present

## 2022-12-31 DIAGNOSIS — F319 Bipolar disorder, unspecified: Secondary | ICD-10-CM | POA: Diagnosis not present

## 2022-12-31 DIAGNOSIS — S82141D Displaced bicondylar fracture of right tibia, subsequent encounter for closed fracture with routine healing: Secondary | ICD-10-CM | POA: Diagnosis not present

## 2022-12-31 DIAGNOSIS — D62 Acute posthemorrhagic anemia: Secondary | ICD-10-CM | POA: Diagnosis not present

## 2022-12-31 DIAGNOSIS — Z87891 Personal history of nicotine dependence: Secondary | ICD-10-CM | POA: Diagnosis not present

## 2023-01-09 DIAGNOSIS — Z9181 History of falling: Secondary | ICD-10-CM | POA: Diagnosis not present

## 2023-01-09 DIAGNOSIS — F319 Bipolar disorder, unspecified: Secondary | ICD-10-CM | POA: Diagnosis not present

## 2023-01-09 DIAGNOSIS — S82141D Displaced bicondylar fracture of right tibia, subsequent encounter for closed fracture with routine healing: Secondary | ICD-10-CM | POA: Diagnosis not present

## 2023-01-09 DIAGNOSIS — D62 Acute posthemorrhagic anemia: Secondary | ICD-10-CM | POA: Diagnosis not present

## 2023-01-09 DIAGNOSIS — Z87891 Personal history of nicotine dependence: Secondary | ICD-10-CM | POA: Diagnosis not present

## 2023-01-14 DIAGNOSIS — M1990 Unspecified osteoarthritis, unspecified site: Secondary | ICD-10-CM | POA: Diagnosis not present

## 2023-01-14 DIAGNOSIS — K5791 Diverticulosis of intestine, part unspecified, without perforation or abscess with bleeding: Secondary | ICD-10-CM | POA: Diagnosis not present

## 2023-01-14 DIAGNOSIS — R0902 Hypoxemia: Secondary | ICD-10-CM | POA: Diagnosis not present

## 2023-01-14 DIAGNOSIS — S82141A Displaced bicondylar fracture of right tibia, initial encounter for closed fracture: Secondary | ICD-10-CM | POA: Diagnosis not present

## 2023-01-15 DIAGNOSIS — S82141D Displaced bicondylar fracture of right tibia, subsequent encounter for closed fracture with routine healing: Secondary | ICD-10-CM | POA: Diagnosis not present

## 2023-01-15 DIAGNOSIS — S82121D Displaced fracture of lateral condyle of right tibia, subsequent encounter for closed fracture with routine healing: Secondary | ICD-10-CM | POA: Diagnosis not present

## 2023-01-16 DIAGNOSIS — S82141D Displaced bicondylar fracture of right tibia, subsequent encounter for closed fracture with routine healing: Secondary | ICD-10-CM | POA: Diagnosis not present

## 2023-01-16 DIAGNOSIS — Z9181 History of falling: Secondary | ICD-10-CM | POA: Diagnosis not present

## 2023-01-16 DIAGNOSIS — Z87891 Personal history of nicotine dependence: Secondary | ICD-10-CM | POA: Diagnosis not present

## 2023-01-16 DIAGNOSIS — D62 Acute posthemorrhagic anemia: Secondary | ICD-10-CM | POA: Diagnosis not present

## 2023-01-16 DIAGNOSIS — F319 Bipolar disorder, unspecified: Secondary | ICD-10-CM | POA: Diagnosis not present

## 2023-01-18 DIAGNOSIS — D62 Acute posthemorrhagic anemia: Secondary | ICD-10-CM | POA: Diagnosis not present

## 2023-01-18 DIAGNOSIS — F319 Bipolar disorder, unspecified: Secondary | ICD-10-CM | POA: Diagnosis not present

## 2023-01-18 DIAGNOSIS — Z9181 History of falling: Secondary | ICD-10-CM | POA: Diagnosis not present

## 2023-01-18 DIAGNOSIS — Z87891 Personal history of nicotine dependence: Secondary | ICD-10-CM | POA: Diagnosis not present

## 2023-01-18 DIAGNOSIS — S82141D Displaced bicondylar fracture of right tibia, subsequent encounter for closed fracture with routine healing: Secondary | ICD-10-CM | POA: Diagnosis not present

## 2023-01-21 ENCOUNTER — Ambulatory Visit: Payer: Medicare Other | Admitting: Family Medicine

## 2023-01-21 DIAGNOSIS — F319 Bipolar disorder, unspecified: Secondary | ICD-10-CM | POA: Diagnosis not present

## 2023-01-21 DIAGNOSIS — Z87891 Personal history of nicotine dependence: Secondary | ICD-10-CM | POA: Diagnosis not present

## 2023-01-21 DIAGNOSIS — Z9181 History of falling: Secondary | ICD-10-CM | POA: Diagnosis not present

## 2023-01-21 DIAGNOSIS — D62 Acute posthemorrhagic anemia: Secondary | ICD-10-CM | POA: Diagnosis not present

## 2023-01-21 DIAGNOSIS — S82141D Displaced bicondylar fracture of right tibia, subsequent encounter for closed fracture with routine healing: Secondary | ICD-10-CM | POA: Diagnosis not present

## 2023-01-24 DIAGNOSIS — Z87891 Personal history of nicotine dependence: Secondary | ICD-10-CM | POA: Diagnosis not present

## 2023-01-24 DIAGNOSIS — F319 Bipolar disorder, unspecified: Secondary | ICD-10-CM | POA: Diagnosis not present

## 2023-01-24 DIAGNOSIS — Z9181 History of falling: Secondary | ICD-10-CM | POA: Diagnosis not present

## 2023-01-24 DIAGNOSIS — S82141D Displaced bicondylar fracture of right tibia, subsequent encounter for closed fracture with routine healing: Secondary | ICD-10-CM | POA: Diagnosis not present

## 2023-01-24 DIAGNOSIS — D62 Acute posthemorrhagic anemia: Secondary | ICD-10-CM | POA: Diagnosis not present

## 2023-01-29 DIAGNOSIS — S82141D Displaced bicondylar fracture of right tibia, subsequent encounter for closed fracture with routine healing: Secondary | ICD-10-CM | POA: Diagnosis not present

## 2023-01-29 DIAGNOSIS — Z87891 Personal history of nicotine dependence: Secondary | ICD-10-CM | POA: Diagnosis not present

## 2023-01-29 DIAGNOSIS — Z9181 History of falling: Secondary | ICD-10-CM | POA: Diagnosis not present

## 2023-01-29 DIAGNOSIS — D62 Acute posthemorrhagic anemia: Secondary | ICD-10-CM | POA: Diagnosis not present

## 2023-01-29 DIAGNOSIS — F319 Bipolar disorder, unspecified: Secondary | ICD-10-CM | POA: Diagnosis not present

## 2023-01-31 DIAGNOSIS — F319 Bipolar disorder, unspecified: Secondary | ICD-10-CM | POA: Diagnosis not present

## 2023-01-31 DIAGNOSIS — Z87891 Personal history of nicotine dependence: Secondary | ICD-10-CM | POA: Diagnosis not present

## 2023-01-31 DIAGNOSIS — S82141D Displaced bicondylar fracture of right tibia, subsequent encounter for closed fracture with routine healing: Secondary | ICD-10-CM | POA: Diagnosis not present

## 2023-01-31 DIAGNOSIS — Z9181 History of falling: Secondary | ICD-10-CM | POA: Diagnosis not present

## 2023-01-31 DIAGNOSIS — D62 Acute posthemorrhagic anemia: Secondary | ICD-10-CM | POA: Diagnosis not present

## 2023-02-05 DIAGNOSIS — F319 Bipolar disorder, unspecified: Secondary | ICD-10-CM | POA: Diagnosis not present

## 2023-02-05 DIAGNOSIS — Z87891 Personal history of nicotine dependence: Secondary | ICD-10-CM | POA: Diagnosis not present

## 2023-02-05 DIAGNOSIS — D62 Acute posthemorrhagic anemia: Secondary | ICD-10-CM | POA: Diagnosis not present

## 2023-02-05 DIAGNOSIS — Z9181 History of falling: Secondary | ICD-10-CM | POA: Diagnosis not present

## 2023-02-05 DIAGNOSIS — S82141D Displaced bicondylar fracture of right tibia, subsequent encounter for closed fracture with routine healing: Secondary | ICD-10-CM | POA: Diagnosis not present

## 2023-02-12 DIAGNOSIS — S82121D Displaced fracture of lateral condyle of right tibia, subsequent encounter for closed fracture with routine healing: Secondary | ICD-10-CM | POA: Diagnosis not present

## 2023-02-13 DIAGNOSIS — Z9181 History of falling: Secondary | ICD-10-CM | POA: Diagnosis not present

## 2023-02-13 DIAGNOSIS — D62 Acute posthemorrhagic anemia: Secondary | ICD-10-CM | POA: Diagnosis not present

## 2023-02-13 DIAGNOSIS — Z87891 Personal history of nicotine dependence: Secondary | ICD-10-CM | POA: Diagnosis not present

## 2023-02-13 DIAGNOSIS — F319 Bipolar disorder, unspecified: Secondary | ICD-10-CM | POA: Diagnosis not present

## 2023-02-13 DIAGNOSIS — S82141D Displaced bicondylar fracture of right tibia, subsequent encounter for closed fracture with routine healing: Secondary | ICD-10-CM | POA: Diagnosis not present

## 2023-02-14 ENCOUNTER — Other Ambulatory Visit (HOSPITAL_COMMUNITY): Payer: Self-pay | Admitting: Student

## 2023-02-14 DIAGNOSIS — Z9889 Other specified postprocedural states: Secondary | ICD-10-CM

## 2023-02-14 DIAGNOSIS — F331 Major depressive disorder, recurrent, moderate: Secondary | ICD-10-CM | POA: Diagnosis not present

## 2023-02-14 DIAGNOSIS — F411 Generalized anxiety disorder: Secondary | ICD-10-CM | POA: Diagnosis not present

## 2023-02-14 DIAGNOSIS — F410 Panic disorder [episodic paroxysmal anxiety] without agoraphobia: Secondary | ICD-10-CM | POA: Diagnosis not present

## 2023-02-19 ENCOUNTER — Ambulatory Visit (INDEPENDENT_AMBULATORY_CARE_PROVIDER_SITE_OTHER): Payer: Medicare Other | Admitting: Family Medicine

## 2023-02-19 ENCOUNTER — Encounter: Payer: Self-pay | Admitting: Family Medicine

## 2023-02-19 VITALS — BP 108/62 | HR 110 | Ht 65.0 in | Wt 127.2 lb

## 2023-02-19 DIAGNOSIS — F3170 Bipolar disorder, currently in remission, most recent episode unspecified: Secondary | ICD-10-CM

## 2023-02-19 DIAGNOSIS — R339 Retention of urine, unspecified: Secondary | ICD-10-CM | POA: Diagnosis not present

## 2023-02-19 NOTE — Patient Instructions (Signed)
Call [phone] to schedule mammogram.  Consider Dexa Scan (Bone density).  Labs at follow up visit.  Follow up in 6 months.  Call with concerns.  Take care  Dr. Adriana Simas

## 2023-02-20 DIAGNOSIS — R339 Retention of urine, unspecified: Secondary | ICD-10-CM | POA: Insufficient documentation

## 2023-02-20 DIAGNOSIS — Z0000 Encounter for general adult medical examination without abnormal findings: Secondary | ICD-10-CM | POA: Insufficient documentation

## 2023-02-20 NOTE — Assessment & Plan Note (Signed)
Stable.  Continue current medications.

## 2023-02-20 NOTE — Progress Notes (Signed)
Subjective:  Patient ID: Pamela Henson, female    DOB: December 26, 1950  Age: 72 y.o. MRN: [id number]  CC: Chief Complaint  Patient presents with   Establish Care    HPI:  72 year old female presents to establish care.  Patient recently suffered a tibial plateau fracture.  She states that overall she is doing well.  Still has some swelling to the right lower extremity but is overall doing well.  Bipolar disorder managed by psychiatry.  She is on Zoloft and gabapentin and is stable.  Patient states that she has had prior issues with her bladder and was placed on Flomax.  She states that she has been followed by urology and she has been stable on this medication and wishes to continue.  Patient is overdue for multiple preventative healthcare maintenance items.   Patient is overdue for mammogram and DEXA scan.  She is also overdue for immunizations.  She wants to wait at this time.  Patient Active Problem List   Diagnosis Date Noted   Urinary retention 02/20/2023   Preventative health care 02/20/2023   Constipation 10/14/2022   Perennial allergic rhinitis 01/01/2022   History of adenomatous polyp of colon 10/31/2017   Arthritis 10/11/2015   Degeneration of lumbar or lumbosacral intervertebral disc 01/16/2009   Bipolar affective disorder (HCC) 09/24/1998    Social Hx   Social History   Socioeconomic History   Marital status: Widowed    Spouse name: Not on file   Number of children: 1   Years of education: Not on file   Highest education level: Not on file  Occupational History   Occupation: Disablity    Comment: Due to back problems  Tobacco Use   Smoking status: Former    Packs/day: 1.00    Years: 15.00    Additional pack years: 0.00    Total pack years: 15.00    Types: Cigarettes    Quit date: 09/24/1998    Years since quitting: 24.4   Smokeless tobacco: Never  Vaping Use   Vaping Use: Never used  Substance and Sexual Activity   Alcohol use: No    Alcohol/week:  0.0 standard drinks of alcohol   Drug use: No   Sexual activity: Not on file  Other Topics Concern   Not on file  Social History Narrative   Not on file   Social Determinants of Health   Financial Resource Strain: Not on file  Food Insecurity: No Food Insecurity (10/17/2022)   Hunger Vital Sign    Worried About Running Out of Food in the Last Year: Never true    Ran Out of Food in the Last Year: Never true  Transportation Needs: No Transportation Needs (10/17/2022)   PRAPARE - Administrator, Civil Service (Medical): No    Lack of Transportation (Non-Medical): No  Physical Activity: Not on file  Stress: Not on file  Social Connections: Not on file    Review of Systems Per HPI  Objective:  BP 108/62   Pulse (!) 110   Ht 5\' 5"  (1.651 m)   Wt 127 lb 3.2 oz (57.7 kg)   SpO2 93%   BMI 21.17 kg/m      02/19/2023    2:26 PM 12/25/2022   11:24 AM 12/15/2022    8:14 AM  BP/Weight  Systolic BP 108 110 130  Diastolic BP 62 64 73  Wt. (Lbs) 127.2    BMI 21.17 kg/m2      Physical Exam Vitals and  nursing note reviewed.  Constitutional:      General: She is not in acute distress.    Appearance: Normal appearance.  HENT:     Head: Normocephalic and atraumatic.  Eyes:     General:        Right eye: No discharge.        Left eye: No discharge.     Conjunctiva/sclera: Conjunctivae normal.  Cardiovascular:     Rate and Rhythm: Normal rate and regular rhythm.  Pulmonary:     Effort: Pulmonary effort is normal.     Breath sounds: Normal breath sounds. No wheezing, rhonchi or rales.  Neurological:     Mental Status: She is alert.  Psychiatric:        Mood and Affect: Mood normal.        Behavior: Behavior normal.     Lab Results  Component Value Date   WBC 9.5 12/14/2022   HGB 9.6 (L) 12/14/2022   HCT 31.2 (L) 12/14/2022   PLT 351 12/14/2022   GLUCOSE 110 (H) 12/13/2022   CHOL 202 (H) 10/18/2015   TRIG 128 10/18/2015   HDL 57 10/18/2015   LDLCALC 119  (H) 10/18/2015   ALT 12 10/12/2022   AST 22 10/12/2022   NA 139 12/13/2022   K 4.3 12/13/2022   CL 107 12/13/2022   CREATININE 0.71 12/13/2022   BUN 21 12/13/2022   CO2 27 12/13/2022   INR 0.9 10/12/2022     Assessment & Plan:   Problem List Items Addressed This Visit       Genitourinary   Urinary retention    History of urinary retention.  Was placed on Flomax by urology.  Will continue.        Other   Bipolar affective disorder (HCC) - Primary    Stable. Continue current medications.       Follow-up:  6 months  Katianne Barre Adriana Simas DO [facility]

## 2023-02-20 NOTE — Assessment & Plan Note (Signed)
History of urinary retention.  Was placed on Flomax by urology.  Will continue.

## 2023-02-21 ENCOUNTER — Ambulatory Visit
Admission: EM | Admit: 2023-02-21 | Discharge: 2023-02-21 | Disposition: A | Discharge status: Home / Self Care | Payer: Medicare Other | Attending: Nurse Practitioner | Admitting: Nurse Practitioner

## 2023-02-21 DIAGNOSIS — R3915 Urgency of urination: Secondary | ICD-10-CM

## 2023-02-21 DIAGNOSIS — Z87898 Personal history of other specified conditions: Secondary | ICD-10-CM

## 2023-02-21 LAB — POCT URINALYSIS DIP (MANUAL ENTRY)
Bilirubin, UA: NEGATIVE
Blood, UA: NEGATIVE
Glucose, UA: NEGATIVE mg/dL
Ketones, POC UA: NEGATIVE mg/dL
Leukocytes, UA: NEGATIVE
Nitrite, UA: NEGATIVE
Protein Ur, POC: NEGATIVE mg/dL
Spec Grav, UA: >=1.03 — AB (ref 1.010–1.025)
Urobilinogen, UA: 0.2 E.U./dL
pH, UA: 7 (ref 5.0–8.0)

## 2023-02-21 NOTE — Discharge Instructions (Signed)
The urinalysis did not indicate a urinary tract infection.  It does show that you need to increase your fluid intake.  Make sure you are drinking at least 8-10 8 ounce glasses of water daily. A urine culture has been ordered.  You will be contacted if the pending test results are abnormal. Continue your current medication, including the tamsulosin. May take Tylenol as needed for pain, fever, or general discomfort. Develop a toileting schedule that will allow you to urinate every 2 hours. I would like for you to follow-up with your urologist to let him know about your symptoms and to determine if you need to be reevaluated. Follow-up as needed.

## 2023-02-21 NOTE — ED Provider Notes (Signed)
[facility]    CSN: [id number] Arrival date & time: 02/21/23  1432      History   Chief Complaint No chief complaint on file.   HPI Pamela Henson is a 72 y.o. female.   The history is provided by the patient.   The patient presents for complaints of low back pain, decreased urine stream, incomplete bladder emptying, and urinary urgency that started today.  Patient denies fever, chills, chest pain, abdominal pain, hematuria, dysuria, flank pain, or vaginal symptoms.  Patient reports approximately 6 years ago, she was treated for urinary tract infection which led to urinary retention.  She states that since that time, she has been on tamsulosin.  She does currently see urology, her last visit was in October of last year.  Past Medical History:  Diagnosis Date   Anemia    Bipolar 1 disorder (HCC)    Bipolar 1 disorder (HCC)    Complication of anesthesia    Headache    History of chicken pox    History of measles    History of mumps    PONV (postoperative nausea and vomiting)    Urine retention    takes Flomax    Patient Active Problem List   Diagnosis Date Noted   Urinary retention 02/20/2023   Preventative health care 02/20/2023   Constipation 10/14/2022   Perennial allergic rhinitis 01/01/2022   History of adenomatous polyp of colon 10/31/2017   Arthritis 10/11/2015   Degeneration of lumbar or lumbosacral intervertebral disc 01/16/2009   Bipolar affective disorder (HCC) 09/24/1998    Past Surgical History:  Procedure Laterality Date   ABDOMINAL HYSTERECTOMY  1979   age 68 due to vaginal bleeding   COLONOSCOPY N/A 10/30/2017   Procedure: COLONOSCOPY;  Surgeon: Malissa Hippo, MD;  [facility]: [facility];  Service: Endoscopy;  Laterality: N/A;  1225   COLONOSCOPY WITH PROPOFOL N/A 10/13/2022   Procedure: COLONOSCOPY WITH PROPOFOL;  Surgeon: Dolores Frame, MD;  [facility]: [facility];  Service: Gastroenterology;  Laterality: N/A;    ESOPHAGOGASTRODUODENOSCOPY (EGD) WITH PROPOFOL N/A 10/13/2022   Procedure: ESOPHAGOGASTRODUODENOSCOPY (EGD) WITH PROPOFOL;  Surgeon: Dolores Frame, MD;  [facility];  Service: Gastroenterology;  Laterality: N/A;   ORIF TIBIA PLATEAU Right 12/12/2022   Procedure: OPEN REDUCTION INTERNAL FIXATION (ORIF) TIBIAL PLATEAU;  Surgeon: Roby Lofts, MD;  Location: [facility];  Service: Orthopedics;  Laterality: Right;   POLYPECTOMY  10/13/2022   Procedure: POLYPECTOMY;  Surgeon: Dolores Frame, MD;  [facility];  Service: Gastroenterology;;    OB History     Gravida  1   Para  1   Term      Preterm      AB      Living         SAB      IAB      Ectopic      Multiple      Live Births               Home Medications    Prior to Admission medications   Medication Sig Start Date End Date Taking? Authorizing Provider  acetaminophen (TYLENOL) 500 MG tablet Take 500 mg by mouth every 6 (six) hours as needed for mild pain, moderate pain or headache.    [provider]  cholecalciferol (VITAMIN D3) 25 MCG (1000 UNIT) tablet Take 1 tablet (1,000 Units total) by mouth daily. 12/14/22 03/14/23  [place],  PA-C  gabapentin (NEURONTIN) 100 MG capsule Take 100 mg by mouth 3 (three) times daily.    [provider]  HYDROcodone-acetaminophen (NORCO/VICODIN) 5-325 MG tablet Take 1 tablet by mouth every 4 (four) hours as needed for moderate pain or severe pain. 12/14/22   [place], PA-C  methocarbamol (ROBAXIN) 500 MG tablet Take 1 tablet (500 mg total) by mouth every 6 (six) hours as needed for muscle spasms. 12/14/22   [place], PA-C  Multiple Vitamins-Minerals (MULTIVITAMIN WITH MINERALS) tablet Take 1 tablet by mouth daily.    [provider]  sertraline (ZOLOFT) 50 MG tablet Take 50 mg by mouth daily. hs 10/16/22   [provider]  tamsulosin (FLOMAX) 0.4 MG CAPS capsule Take 1 capsule (0.4 mg  total) by mouth daily. Patient taking differently: Take 0.4 mg by mouth at bedtime. 05/25/16   Malva Limes, MD    Family History Family History  Problem Relation Age of Onset   Stroke Mother 29   Stroke Father 62   Colon cancer Neg Hx     Social History Social History   Tobacco Use   Smoking status: Former    Packs/day: 1.00    Years: 15.00    Additional pack years: 0.00    Total pack years: 15.00    Types: Cigarettes    Quit date: 09/24/1998    Years since quitting: 24.4   Smokeless tobacco: Never  Vaping Use   Vaping Use: Never used  Substance Use Topics   Alcohol use: No    Alcohol/week: 0.0 standard drinks of alcohol   Drug use: No     Allergies   Asa [aspirin]   Review of Systems Review of Systems Per HPI  Physical Exam Triage Vital Signs ED Triage Vitals  Enc Vitals Group     BP 02/21/23 1440 135/77     Pulse Rate 02/21/23 1440 89     Resp 02/21/23 1440 20     Temp 02/21/23 1440 97.7 F (36.5 C)     Temp Source 02/21/23 1440 Oral     SpO2 02/21/23 1440 92 %     Weight --      Height --      Head Circumference --      Peak Flow --      Pain Score 02/21/23 1442 8     Pain Loc --      Pain Edu? --      Excl. in GC? --    No data found.  Updated Vital Signs BP 135/77 (BP Location: Right Arm)   Pulse 89   Temp 97.7 F (36.5 C) (Oral)   Resp 20   SpO2 92%   Visual Acuity Right Eye Distance:   Left Eye Distance:   Bilateral Distance:    Right Eye Near:   Left Eye Near:    Bilateral Near:     Physical Exam Vitals and nursing note reviewed.  Constitutional:      General: She is not in acute distress.    Appearance: Normal appearance.  HENT:     Head: Normocephalic.  Eyes:     Extraocular Movements: Extraocular movements intact.     Pupils: Pupils are equal, round, and reactive to light.  Cardiovascular:     Rate and Rhythm: Normal rate and regular rhythm.     Pulses: Normal pulses.     Heart sounds: Normal heart sounds.   Pulmonary:     Effort: Pulmonary effort is  normal. No respiratory distress.     Breath sounds: Normal breath sounds. No stridor. No wheezing, rhonchi or rales.  Abdominal:     General: Bowel sounds are normal.     Palpations: Abdomen is soft.     Tenderness: There is abdominal tenderness in the suprapubic area. There is no right CVA tenderness or left CVA tenderness.  Musculoskeletal:     Cervical back: Normal range of motion.  Lymphadenopathy:     Cervical: No cervical adenopathy.  Skin:    General: Skin is warm and dry.  Neurological:     General: No focal deficit present.     Mental Status: She is alert and oriented to person, place, and time.  Psychiatric:        Mood and Affect: Mood normal.        Behavior: Behavior normal.      UC Treatments / Results  Labs (all labs ordered are listed, but only abnormal results are displayed) Labs Reviewed  POCT URINALYSIS DIP (MANUAL ENTRY) - Abnormal; Notable for the following components:      Result Value   Spec Grav, UA >=1.030 (*)    All other components within normal limits  URINE CULTURE    EKG   Radiology No results found.  Procedures Procedures (including critical care time)  Medications Ordered in UC Medications - No data to display  Initial Impression / Assessment and Plan / UC Course  I have reviewed the triage vital signs and the nursing notes.  Pertinent labs & imaging results that were available during my care of the patient were reviewed by me and considered in my medical decision making (see chart for details).  The patient is well-appearing, she is in no acute distress, vital signs are stable.  Urinalysis is negative for an obvious urinary tract infection.  Urine culture is pending.  Supportive care recommendations were provided and discussed with the patient to include continuing her tamsulosin, increasing her fluid intake, and following up with urology for further evaluation.  Patient is in agreement  with this plan of care and verbalizes understanding.  All questions were answered.  Patient stable for discharge.   Final Clinical Impressions(s) / UC Diagnoses   Final diagnoses:  Urinary urgency  History of urinary retention     Discharge Instructions      The urinalysis did not indicate a urinary tract infection.  It does show that you need to increase your fluid intake.  Make sure you are drinking at least 8-10 8 ounce glasses of water daily. A urine culture has been ordered.  You will be contacted if the pending test results are abnormal. Continue your current medication, including the tamsulosin. May take Tylenol as needed for pain, fever, or general discomfort. Develop a toileting schedule that will allow you to urinate every 2 hours. I would like for you to follow-up with your urologist to let him know about your symptoms and to determine if you need to be reevaluated. Follow-up as needed.     ED Prescriptions   None    PDMP not reviewed this encounter.   Abran Cantor, NP 02/21/23 1529

## 2023-02-21 NOTE — ED Triage Notes (Signed)
Pt reports she has low back pain, trouble urinating not completing her stream, not emptying her bladder, frequent urination that started today.

## 2023-02-22 LAB — URINE CULTURE: Culture: NO GROWTH

## 2023-02-27 ENCOUNTER — Ambulatory Visit (HOSPITAL_COMMUNITY)
Admission: RE | Admit: 2023-02-27 | Discharge: 2023-02-27 | Disposition: A | Discharge status: Home / Self Care | Payer: Medicare Other | Source: Ambulatory Visit | Attending: Student | Admitting: Student

## 2023-02-27 DIAGNOSIS — Z9889 Other specified postprocedural states: Secondary | ICD-10-CM | POA: Diagnosis not present

## 2023-02-27 DIAGNOSIS — Z0389 Encounter for observation for other suspected diseases and conditions ruled out: Secondary | ICD-10-CM | POA: Diagnosis not present

## 2023-03-14 DIAGNOSIS — F3342 Major depressive disorder, recurrent, in full remission: Secondary | ICD-10-CM | POA: Diagnosis not present

## 2023-04-02 DIAGNOSIS — S82141D Displaced bicondylar fracture of right tibia, subsequent encounter for closed fracture with routine healing: Secondary | ICD-10-CM | POA: Diagnosis not present

## 2023-04-18 DIAGNOSIS — F411 Generalized anxiety disorder: Secondary | ICD-10-CM | POA: Diagnosis not present

## 2023-04-18 DIAGNOSIS — F331 Major depressive disorder, recurrent, moderate: Secondary | ICD-10-CM | POA: Diagnosis not present

## 2023-04-18 DIAGNOSIS — F410 Panic disorder [episodic paroxysmal anxiety] without agoraphobia: Secondary | ICD-10-CM | POA: Diagnosis not present

## 2023-05-10 ENCOUNTER — Ambulatory Visit (INDEPENDENT_AMBULATORY_CARE_PROVIDER_SITE_OTHER): Payer: Medicare Other

## 2023-05-10 VITALS — Ht 65.0 in | Wt 128.4 lb

## 2023-05-10 DIAGNOSIS — Z0000 Encounter for general adult medical examination without abnormal findings: Secondary | ICD-10-CM

## 2023-05-10 DIAGNOSIS — Z780 Asymptomatic menopausal state: Secondary | ICD-10-CM | POA: Diagnosis not present

## 2023-05-10 DIAGNOSIS — Z1231 Encounter for screening mammogram for malignant neoplasm of breast: Secondary | ICD-10-CM

## 2023-05-10 NOTE — Progress Notes (Signed)
 Because this visit was a virtual/telehealth visit,  certain criteria was not obtained, such a blood pressure, CBG if patient is a diabetic, and timed get up and go. Any medications not marked as "taking" was not mentioned during the medication reconciliation part of the visit. Any vitals not documented were not able to be obtained due to this being a telehealth visit. Vitals that have been documented are verbally provided by the patient.  Patient was unable to self-report a recent blood pressure reading due to a lack of equipment at home via telehealth.  Subjective:   Pamela Henson is a 72 y.o. female who presents for an Initial Medicare Annual Wellness Visit.  Visit Complete: Virtual  I connected with  Pamela Henson on 05/10/23 by a audio enabled telemedicine application and verified that I am speaking with the correct person using two identifiers.  Patient Location: Home  Provider Location: Home Office  I discussed the limitations of evaluation and management by telemedicine. The patient expressed understanding and agreed to proceed.  Patient Medicare AWV questionnaire was completed by the patient on n/a; I have confirmed that all information answered by patient is correct and no changes since this date.  Review of Systems     Cardiac Risk Factors include: advanced age (>43men, >60 women);dyslipidemia;hypertension     Objective:    Today's Vitals   05/10/23 1233  Weight: 128 lb 6.4 oz (58.2 kg)  Height: 5\' 5"  (1.651 m)   Body mass index is 21.37 kg/m.     05/10/2023   12:32 PM 12/12/2022   10:06 AM 10/12/2022    9:00 AM 10/12/2022    1:13 AM 10/30/2017   11:27 AM 05/29/2016    9:38 AM 05/27/2016   12:42 PM  Advanced Directives  Does Patient Have a Medical Advance Directive? No No Yes No No Yes Yes  Type of Surveyor, minerals;Living will   Healthcare Power of [place];Living will Healthcare Power of [place];Living will  Does patient want to  make changes to medical advance directive?   No - Patient declined      Would patient like information on creating a medical advance directive? No - Patient declined No - Patient declined   No - Patient declined      Current Medications (verified) Outpatient Encounter Medications as of 05/10/2023  Medication Sig   acetaminophen (TYLENOL) 500 MG tablet Take 500 mg by mouth every 6 (six) hours as needed for mild pain, moderate pain or headache.   gabapentin (NEURONTIN) 100 MG capsule Take 100 mg by mouth 3 (three) times daily.   Multiple Vitamins-Minerals (MULTIVITAMIN WITH MINERALS) tablet Take 1 tablet by mouth daily.   sertraline (ZOLOFT) 50 MG tablet Take 50 mg by mouth daily. hs   tamsulosin (FLOMAX) 0.4 MG CAPS capsule Take 1 capsule (0.4 mg total) by mouth daily. (Patient taking differently: Take 0.4 mg by mouth at bedtime.)   HYDROcodone-acetaminophen (NORCO/VICODIN) 5-325 MG tablet Take 1 tablet by mouth every 4 (four) hours as needed for moderate pain or severe pain. (Patient not taking: Reported on 05/10/2023)   methocarbamol (ROBAXIN) 500 MG tablet Take 1 tablet (500 mg total) by mouth every 6 (six) hours as needed for muscle spasms. (Patient not taking: Reported on 05/10/2023)   No facility-administered encounter medications on file as of 05/10/2023.    Allergies (verified) Asa [aspirin]   History: Past Medical History:  Diagnosis Date   Anemia    Bipolar 1 disorder (HCC)  Bipolar 1 disorder (HCC)    Complication of anesthesia    Headache    History of chicken pox    History of measles    History of mumps    PONV (postoperative nausea and vomiting)    Urine retention    takes Flomax   Past Surgical History:  Procedure Laterality Date   ABDOMINAL HYSTERECTOMY  1979   age 53 due to vaginal bleeding   COLONOSCOPY N/A 10/30/2017   Procedure: COLONOSCOPY;  Surgeon: Malissa Hippo, MD;  [facility];  Service: Endoscopy;  Laterality: N/A;  1225   COLONOSCOPY  WITH PROPOFOL N/A 10/13/2022   Procedure: COLONOSCOPY WITH PROPOFOL;  Surgeon: Dolores Frame, MD;  [facility];  Service: Gastroenterology;  Laterality: N/A;   ESOPHAGOGASTRODUODENOSCOPY (EGD) WITH PROPOFOL N/A 10/13/2022   Procedure: ESOPHAGOGASTRODUODENOSCOPY (EGD) WITH PROPOFOL;  Surgeon: Dolores Frame, MD;  [facility];  Service: Gastroenterology;  Laterality: N/A;   ORIF TIBIA PLATEAU Right 12/12/2022   Procedure: OPEN REDUCTION INTERNAL FIXATION (ORIF) TIBIAL PLATEAU;  Surgeon: Roby Lofts, MD;  Location: [facility];  Service: Orthopedics;  Laterality: Right;   POLYPECTOMY  10/13/2022   Procedure: POLYPECTOMY;  Surgeon: Dolores Frame, MD;  [facility];  Service: Gastroenterology;;   Family History  Problem Relation Age of Onset   Stroke Mother 57   Stroke Father 38   Colon cancer Neg Hx    Social History   Socioeconomic History   Marital status: Widowed    Spouse name: Not on file   Number of children: 1   Years of education: Not on file   Highest education level: Not on file  Occupational History   Occupation: Disablity    Comment: Due to back problems  Tobacco Use   Smoking status: Former    Current packs/day: 0.00    Average packs/day: 1 pack/day for 15.0 years (15.0 ttl pk-yrs)    Types: Cigarettes    Start date: 09/25/1983    Quit date: 09/24/1998    Years since quitting: 24.6   Smokeless tobacco: Never  Vaping Use   Vaping status: Never Used  Substance and Sexual Activity   Alcohol use: No    Alcohol/week: 0.0 standard drinks of alcohol   Drug use: No   Sexual activity: Not on file  Other Topics Concern   Not on file  Social History Narrative   Not on file   Social Determinants of Health   Financial Resource Strain: Low Risk  (05/10/2023)   Overall Financial Resource Strain (CARDIA)    Difficulty of Paying Living Expenses: Not hard at all  Food Insecurity: No Food Insecurity (05/10/2023)    Hunger Vital Sign    Worried About Running Out of Food in the Last Year: Never true    Ran Out of Food in the Last Year: Never true  Transportation Needs: No Transportation Needs (05/10/2023)   PRAPARE - Administrator, Civil Service (Medical): No    Lack of Transportation (Non-Medical): No  Physical Activity: Sufficiently Active (05/10/2023)   Exercise Vital Sign    Days of Exercise per Week: 7 days    Minutes of Exercise per Session: 30 min  Stress: No Stress Concern Present (05/10/2023)   [employer] of Occupational Health - Occupational Stress Questionnaire    Feeling of Stress : Not at all  Social Connections: Socially Integrated (05/10/2023)   Social Connection and Isolation Panel [NHANES]    Frequency  of Communication with Friends and Family: More than three times a week    Frequency of Social Gatherings with Friends and Family: More than three times a week    Attends Religious Services: More than 4 times per year    Active Member of [employer] or Organizations: Yes    Attends Engineer, structural: More than 4 times per year    Marital Status: Married    Tobacco Counseling Counseling given: Yes   Clinical Intake:  Pre-visit preparation completed: Yes  Pain : No/denies pain     BMI - recorded: 21.37 Nutritional Risks: None Diabetes: No  How often do you need to have someone help you when you read instructions, pamphlets, or other written materials from your doctor or pharmacy?: 1 - Never  Interpreter Needed?: No  Information entered by ::  Billie Intriago,CMA   Activities of Daily Living    05/10/2023   12:47 PM 12/25/2022   11:23 AM  In your present state of health, do you have any difficulty performing the following activities:  Hearing? 0 0  Vision? 0 0  Difficulty concentrating or making decisions? 0 0  Walking or climbing stairs? 0 1  Dressing or bathing? 0 0  Doing errands, shopping? 0 0  Preparing Food and eating ? N   Using the  Toilet? N   In the past six months, have you accidently leaked urine? N   Do you have problems with loss of bowel control? N   Managing your Medications? N   Managing your Finances? N   Housekeeping or managing your Housekeeping? N     Patient Care Team: Tommie Sams, DO as PCP - General (Family Medicine) Andee Poles, MD as Consulting Physician (Psychiatry) Deatra Robinson, NP as Nurse Practitioner (Psychiatry)  Indicate any recent Medical Services you may have received from other than [facility] providers in the past year (date may be approximate).     Assessment:   This is a routine wellness examination for Pamela Henson.  Hearing/Vision screen Hearing Screening - Comments:: Patient denies any hearing difficulties.   Vision Screening - Comments:: Wears rx glasses - up to date with routine eye exams  With Dr. Charise Killian at [facility]  Dietary issues and exercise activities discussed:     Goals Addressed             This Visit's Progress    Patient Stated       To stay out of the hospital and get back to pre-injury health       Depression Screen    05/10/2023   12:39 PM 02/19/2023    2:26 PM 12/25/2022   11:23 AM 10/17/2022    8:48 AM 10/12/2015    2:19 PM  PHQ 2/9 Scores  PHQ - 2 Score 0 0 0 0 0  PHQ- 9 Score 0 0 0 0     Fall Risk    05/10/2023   12:47 PM 02/19/2023    2:26 PM 12/25/2022   11:22 AM 10/17/2022    8:48 AM 10/12/2015    2:19 PM  Fall Risk   Falls in the past year? 1 1 1  0 No  Number falls in past yr: 1 0 0 0   Injury with Fall? 1 1 1  0   Risk for fall due to : History of fall(s);Impaired balance/gait;Orthopedic patient   No Fall Risks   Follow up Education provided;Falls prevention discussed   Falls evaluation completed     MEDICARE RISK  AT HOME:  Medicare Risk at Home - 05/10/23 1245     Any stairs in or around the home? No    If so, are there any without handrails? No    Home free of loose throw rugs in walkways, pet beds, electrical cords, etc? Yes     Adequate lighting in your home to reduce risk of falls? Yes    Life alert? No    Use of a cane, walker or w/c? No    Grab bars in the bathroom? No    Shower chair or bench in shower? No    Elevated toilet seat or a handicapped toilet? No             TIMED UP AND GO:  Was the test performed? No    Cognitive Function:        05/10/2023   12:39 PM  6CIT Screen  What Year? 0 points  What month? 0 points  What time? 0 points  Count back from 20 0 points  Months in reverse 0 points  Repeat phrase 0 points  Total Score 0 points    Immunizations  There is no immunization history on file for this patient.  TDAP status: Due, Education has been provided regarding the importance of this vaccine. Advised may receive this vaccine at local pharmacy or Health Dept. Aware to provide a copy of the vaccination record if obtained from local pharmacy or Health Dept. Verbalized acceptance and understanding.  Flu Vaccine status: Due, Education has been provided regarding the importance of this vaccine. Advised may receive this vaccine at local pharmacy or Health Dept. Aware to provide a copy of the vaccination record if obtained from local pharmacy or Health Dept. Verbalized acceptance and understanding.  Pneumococcal vaccine status: Due, Education has been provided regarding the importance of this vaccine. Advised may receive this vaccine at local pharmacy or Health Dept. Aware to provide a copy of the vaccination record if obtained from local pharmacy or Health Dept. Verbalized acceptance and understanding.  Covid-19 vaccine status: Information provided on how to obtain vaccines.   Qualifies for Shingles Vaccine? Yes   Zostavax completed No   Shingrix Completed?: No.    Education has been provided regarding the importance of this vaccine. Patient has been advised to call insurance company to determine out of pocket expense if they have not yet received this vaccine. Advised may also  receive vaccine at local pharmacy or Health Dept. Verbalized acceptance and understanding.  Screening Tests Health Maintenance  Topic Date Due   Medicare Annual Wellness (AWV)  Never done   DTaP/Tdap/Td (1 - Tdap) Never done   Zoster Vaccines- Shingrix (1 of 2) Never done   MAMMOGRAM  10/30/2017   Pneumonia Vaccine 63+ Years old (1 of 1 - PCV) Never done   DEXA SCAN  Never done   COVID-19 Vaccine (1 - 2023-24 season) Never done   INFLUENZA VACCINE  04/25/2023   Colonoscopy  10/13/2029   Hepatitis C Screening  Completed   HPV VACCINES  Aged Out    Health Maintenance  Health Maintenance Due  Topic Date Due   Medicare Annual Wellness (AWV)  Never done   DTaP/Tdap/Td (1 - Tdap) Never done   Zoster Vaccines- Shingrix (1 of 2) Never done   MAMMOGRAM  10/30/2017   Pneumonia Vaccine 68+ Years old (1 of 1 - PCV) Never done   DEXA SCAN  Never done   COVID-19 Vaccine (1 - 2023-24 season) Never done  INFLUENZA VACCINE  04/25/2023    Colorectal cancer screening: Type of screening: Colonoscopy. Completed 10/13/2022. Repeat every 7 years  Mammogram status: Ordered 05/10/2023. Pt provided with contact info and advised to call to schedule appt.   Bone Density status: Ordered 05/10/2023. Pt provided with contact info and advised to call to schedule appt.  Lung Cancer Screening: (Low Dose CT Chest recommended if Age 44-80 years, 20 pack-year currently smoking OR have quit w/in 15years.) does not qualify.   Lung Cancer Screening Referral: n/a  Additional Screening:  Hepatitis C Screening: does qualify; 10/18/2015  Vision Screening: Recommended annual ophthalmology exams for early detection of glaucoma and other disorders of the eye. Is the patient up to date with their annual eye exam?  Yes  Who is the provider or what is the name of the office in which the patient attends annual eye exams? Dr. Charise Killian [facility] If pt is not established with a provider, would they like to be referred to  a provider to establish care? No .   Dental Screening: Recommended annual dental exams for proper oral hygiene  Diabetic Foot Exam: n/a  Community Resource Referral / Chronic Care Management: CRR required this visit?  No   CCM required this visit?  No     Plan:     I have personally reviewed and noted the following in the patient's chart:   Medical and social history Use of alcohol, tobacco or illicit drugs  Current medications and supplements including opioid prescriptions. Patient is currently taking opioid prescriptions. Information provided to patient regarding non-opioid alternatives. Patient advised to discuss non-opioid treatment plan with their provider. Functional ability and status Nutritional status Physical activity Advanced directives List of other physicians Hospitalizations, surgeries, and ER visits in previous 12 months Vitals Screenings to include cognitive, depression, and falls Referrals and appointments  In addition, I have reviewed and discussed with patient certain preventive protocols, quality metrics, and best practice recommendations. A written personalized care plan for preventive services as well as general preventive health recommendations were provided to patient.     Jordan Hawks Shandon Matson, CMA   05/10/2023   After Visit Summary: (MyChart) Due to this being a telephonic visit, the after visit summary with patients personalized plan was offered to patient via MyChart   Nurse Notes: opioid is on patient's med list but she states she no longer takes it.

## 2023-05-10 NOTE — Patient Instructions (Signed)
[name] , Thank you for taking time to come for your Medicare Wellness Visit. I appreciate your ongoing commitment to your health goals. Please review the following plan we discussed and let me know if I can assist you in the future.   These are the goals we discussed:  Goals      Patient Stated     To stay out of the hospital and get back to pre-injury health        This is a list of the screening recommended for you and due dates:  Health Maintenance  Topic Date Due   DTaP/Tdap/Td vaccine (1 - Tdap) Never done   Zoster (Shingles) Vaccine (1 of 2) Never done   Mammogram  10/30/2017   Pneumonia Vaccine (1 of 1 - PCV) Never done   DEXA scan (bone density measurement)  Never done   COVID-19 Vaccine (1 - 2023-24 season) Never done   Flu Shot  04/25/2023   Medicare Annual Wellness Visit  05/09/2024   Colon Cancer Screening  10/13/2029   Hepatitis C Screening  Completed   HPV Vaccine  Aged Out    Advanced directives: Advance directive discussed with you today. Even though you declined this today, please call our office should you change your mind, and we can give you the proper paperwork for you to fill out. Advance care planning is a way to make decisions about medical care that fits your values in case you are ever unable to make these decisions for yourself.  Information on Advanced Care Planning can be found at [facility] Advance Health Care Directives Advance Health Care Directives ([email or web address]    Conditions/risks identified:  You have an order for:  []   2D Mammogram  [x]   3D Mammogram  [x]   Bone Density   []   Lung Cancer Screening  Please call for appointment:   [facility] [address] [place], [place] [address] [phone]  Make sure to wear two-piece clothing.  No lotions powders or deodorants the day of the appointment Make sure to bring picture ID and insurance card.  Bring list of medications you  are currently taking including any supplements.   Schedule your [place] screening mammogram through MyChart!   Log into your MyChart account.  Go to 'Visit' (or 'Appointments' if on mobile App) --> Schedule an Appointment  Under 'Select a Reason for Visit' choose the Mammogram Screening option.  Complete the pre-visit questions and select the time and place that best fits your schedule.    You are due for the vaccines checked below. You may have these done at your preferred pharmacy. Please have them fax the office proof of the vaccines so that we can update your chart.   [x]  Flu (due annually) [x]  Shingrix (Shingles vaccine) [x]  Pneumonia Vaccines [x]  TDAP (Tetanus) Vaccine every 10 years [x]  Covid-19   Next appointment: VIRTUAL/TELEPHONE APPOINTMENT Follow up in one year for your annual wellness visit  May 15, 2024 at 10:30 am telephone visit.    Preventive Care 18 Years and Older, Female Preventive care refers to lifestyle choices and visits with your health care provider that can promote health and wellness. What does preventive care include? A yearly physical exam. This is also called an annual well check. Dental exams once or twice a year. Routine eye exams. Ask your health care provider how often you should have your eyes checked. Personal lifestyle choices, including: Daily care of your  teeth and gums. Regular physical activity. Eating a healthy diet. Avoiding tobacco and drug use. Limiting alcohol use. Practicing safe sex. Taking low-dose aspirin every day. Taking vitamin and mineral supplements as recommended by your health care provider. What happens during an annual well check? The services and screenings done by your health care provider during your annual well check will depend on your age, overall health, lifestyle risk factors, and family history of disease. Counseling  Your health care provider may ask you questions about your: Alcohol  use. Tobacco use. Drug use. Emotional well-being. Home and relationship well-being. Sexual activity. Eating habits. History of falls. Memory and ability to understand (cognition). Work and work Astronomer. Reproductive health. Screening  You may have the following tests or measurements: Height, weight, and BMI. Blood pressure. Lipid and cholesterol levels. These may be checked every 5 years, or more frequently if you are over 69 years old. Skin check. Lung cancer screening. You may have this screening every year starting at age 61 if you have a 30-pack-year history of smoking and currently smoke or have quit within the past 15 years. Fecal occult blood test (FOBT) of the stool. You may have this test every year starting at age 82. Flexible sigmoidoscopy or colonoscopy. You may have a sigmoidoscopy every 5 years or a colonoscopy every 10 years starting at age 51. Hepatitis C blood test. Hepatitis B blood test. Sexually transmitted disease (STD) testing. Diabetes screening. This is done by checking your blood sugar (glucose) after you have not eaten for a while (fasting). You may have this done every 1-3 years. Bone density scan. This is done to screen for osteoporosis. You may have this done starting at age 34. Mammogram. This may be done every 1-2 years. Talk to your health care provider about how often you should have regular mammograms. Talk with your health care provider about your test results, treatment options, and if necessary, the need for more tests. Vaccines  Your health care provider may recommend certain vaccines, such as: Influenza vaccine. This is recommended every year. Tetanus, diphtheria, and acellular pertussis (Tdap, Td) vaccine. You may need a Td booster every 10 years. Zoster vaccine. You may need this after age 46. Pneumococcal 13-valent conjugate (PCV13) vaccine. One dose is recommended after age 32. Pneumococcal polysaccharide (PPSV23) vaccine. One dose is  recommended after age 62. Talk to your health care provider about which screenings and vaccines you need and how often you need them. This information is not intended to replace advice given to you by your health care provider. Make sure you discuss any questions you have with your health care provider. Document Released: 10/07/2015 Document Revised: 05/30/2016 Document Reviewed: 07/12/2015 Elsevier Interactive Patient Education  2017 [employer].  Fall Prevention in the Home Falls can cause injuries. They can happen to people of all ages. There are many things you can do to make your home safe and to help prevent falls. What can I do on the outside of my home? Regularly fix the edges of walkways and driveways and fix any cracks. Remove anything that might make you trip as you walk through a door, such as a raised step or threshold. Trim any bushes or trees on the path to your home. Use bright outdoor lighting. Clear any walking paths of anything that might make someone trip, such as rocks or tools. Regularly check to see if handrails are loose or broken. Make sure that both sides of any steps have handrails. Any raised decks and  porches should have guardrails on the edges. Have any leaves, snow, or ice cleared regularly. Use sand or salt on walking paths during winter. Clean up any spills in your garage right away. This includes oil or grease spills. What can I do in the bathroom? Use night lights. Install grab bars by the toilet and in the tub and shower. Do not use towel bars as grab bars. Use non-skid mats or decals in the tub or shower. If you need to sit down in the shower, use a plastic, non-slip stool. Keep the floor dry. Clean up any water that spills on the floor as soon as it happens. Remove soap buildup in the tub or shower regularly. Attach bath mats securely with double-sided non-slip rug tape. Do not have throw rugs and other things on the floor that can make you  trip. What can I do in the bedroom? Use night lights. Make sure that you have a light by your bed that is easy to reach. Do not use any sheets or blankets that are too big for your bed. They should not hang down onto the floor. Have a firm chair that has side arms. You can use this for support while you get dressed. Do not have throw rugs and other things on the floor that can make you trip. What can I do in the kitchen? Clean up any spills right away. Avoid walking on wet floors. Keep items that you use a lot in easy-to-reach places. If you need to reach something above you, use a strong step stool that has a grab bar. Keep electrical cords out of the way. Do not use floor polish or wax that makes floors slippery. If you must use wax, use non-skid floor wax. Do not have throw rugs and other things on the floor that can make you trip. What can I do with my stairs? Do not leave any items on the stairs. Make sure that there are handrails on both sides of the stairs and use them. Fix handrails that are broken or loose. Make sure that handrails are as long as the stairways. Check any carpeting to make sure that it is firmly attached to the stairs. Fix any carpet that is loose or worn. Avoid having throw rugs at the top or bottom of the stairs. If you do have throw rugs, attach them to the floor with carpet tape. Make sure that you have a light switch at the top of the stairs and the bottom of the stairs. If you do not have them, ask someone to add them for you. What else can I do to help prevent falls? Wear shoes that: Do not have high heels. Have rubber bottoms. Are comfortable and fit you well. Are closed at the toe. Do not wear sandals. If you use a stepladder: Make sure that it is fully opened. Do not climb a closed stepladder. Make sure that both sides of the stepladder are locked into place. Ask someone to hold it for you, if possible. Clearly mark and make sure that you can  see: Any grab bars or handrails. First and last steps. Where the edge of each step is. Use tools that help you move around (mobility aids) if they are needed. These include: Canes. Walkers. Scooters. Crutches. Turn on the lights when you go into a dark area. Replace any light bulbs as soon as they burn out. Set up your furniture so you have a clear path. Avoid moving your furniture around. If  any of your floors are uneven, fix them. If there are any pets around you, be aware of where they are. Review your medicines with your doctor. Some medicines can make you feel dizzy. This can increase your chance of falling. Ask your doctor what other things that you can do to help prevent falls. This information is not intended to replace advice given to you by your health care provider. Make sure you discuss any questions you have with your health care provider. Document Released: 07/07/2009 Document Revised: 02/16/2016 Document Reviewed: 10/15/2014 Elsevier Interactive Patient Education  2017 [employer].

## 2023-07-18 DIAGNOSIS — F331 Major depressive disorder, recurrent, moderate: Secondary | ICD-10-CM | POA: Diagnosis not present

## 2023-07-18 DIAGNOSIS — F411 Generalized anxiety disorder: Secondary | ICD-10-CM | POA: Diagnosis not present

## 2023-07-18 DIAGNOSIS — F410 Panic disorder [episodic paroxysmal anxiety] without agoraphobia: Secondary | ICD-10-CM | POA: Diagnosis not present

## 2023-07-22 ENCOUNTER — Ambulatory Visit (HOSPITAL_COMMUNITY): Payer: Medicare Other

## 2023-07-31 ENCOUNTER — Ambulatory Visit (HOSPITAL_COMMUNITY)
Admission: RE | Admit: 2023-07-31 | Discharge: 2023-07-31 | Disposition: A | Discharge status: Home / Self Care | Payer: Medicare Other | Source: Ambulatory Visit | Attending: Family Medicine | Admitting: Family Medicine

## 2023-07-31 ENCOUNTER — Other Ambulatory Visit (HOSPITAL_COMMUNITY): Payer: Self-pay | Admitting: Family Medicine

## 2023-07-31 ENCOUNTER — Encounter: Payer: Self-pay | Admitting: Family Medicine

## 2023-07-31 ENCOUNTER — Encounter (HOSPITAL_COMMUNITY): Payer: Self-pay

## 2023-07-31 ENCOUNTER — Other Ambulatory Visit: Payer: Self-pay | Admitting: Family Medicine

## 2023-07-31 ENCOUNTER — Other Ambulatory Visit: Payer: Self-pay

## 2023-07-31 DIAGNOSIS — N63 Unspecified lump in unspecified breast: Secondary | ICD-10-CM

## 2023-07-31 DIAGNOSIS — Z0000 Encounter for general adult medical examination without abnormal findings: Secondary | ICD-10-CM | POA: Diagnosis not present

## 2023-07-31 DIAGNOSIS — Z1231 Encounter for screening mammogram for malignant neoplasm of breast: Secondary | ICD-10-CM

## 2023-07-31 DIAGNOSIS — Z780 Asymptomatic menopausal state: Secondary | ICD-10-CM

## 2023-07-31 DIAGNOSIS — M810 Age-related osteoporosis without current pathological fracture: Secondary | ICD-10-CM | POA: Diagnosis not present

## 2023-08-20 ENCOUNTER — Ambulatory Visit: Payer: Medicare Other | Admitting: "Endocrinology

## 2023-08-20 ENCOUNTER — Ambulatory Visit (INDEPENDENT_AMBULATORY_CARE_PROVIDER_SITE_OTHER): Payer: Medicare Other | Admitting: Family Medicine

## 2023-08-20 ENCOUNTER — Encounter: Payer: Self-pay | Admitting: "Endocrinology

## 2023-08-20 VITALS — BP 108/64 | HR 80 | Ht 65.0 in | Wt 126.4 lb

## 2023-08-20 VITALS — BP 118/75 | HR 91 | Temp 98.4°F | Ht 65.0 in | Wt 126.6 lb

## 2023-08-20 DIAGNOSIS — D649 Anemia, unspecified: Secondary | ICD-10-CM | POA: Diagnosis not present

## 2023-08-20 DIAGNOSIS — Z8673 Personal history of transient ischemic attack (TIA), and cerebral infarction without residual deficits: Secondary | ICD-10-CM

## 2023-08-20 DIAGNOSIS — R339 Retention of urine, unspecified: Secondary | ICD-10-CM

## 2023-08-20 DIAGNOSIS — F3170 Bipolar disorder, currently in remission, most recent episode unspecified: Secondary | ICD-10-CM

## 2023-08-20 DIAGNOSIS — Z13220 Encounter for screening for lipoid disorders: Secondary | ICD-10-CM

## 2023-08-20 DIAGNOSIS — M810 Age-related osteoporosis without current pathological fracture: Secondary | ICD-10-CM | POA: Diagnosis not present

## 2023-08-20 DIAGNOSIS — Z23 Encounter for immunization: Secondary | ICD-10-CM | POA: Diagnosis not present

## 2023-08-20 DIAGNOSIS — Z823 Family history of stroke: Secondary | ICD-10-CM | POA: Insufficient documentation

## 2023-08-20 DIAGNOSIS — Z8719 Personal history of other diseases of the digestive system: Secondary | ICD-10-CM | POA: Insufficient documentation

## 2023-08-20 DIAGNOSIS — E559 Vitamin D deficiency, unspecified: Secondary | ICD-10-CM | POA: Insufficient documentation

## 2023-08-20 MED ORDER — TAMSULOSIN HCL 0.4 MG PO CAPS: 0.4000 mg | ORAL_CAPSULE | Freq: Every day | ORAL | 3 refills | Status: AC

## 2023-08-20 NOTE — Assessment & Plan Note (Signed)
Stable

## 2023-08-20 NOTE — Progress Notes (Signed)
Subjective:  Patient ID: Pamela Henson, female    DOB: 09-Feb-1951  Age: 72 y.o. MRN: [id number]  CC:  Follow up   HPI:  72 year old female presents for follow-up.  Patient has upcoming appointment with Endo regarding severe osteoporosis.  Patient is in need of labs today.  Patient is a candidate for pneumococcal vaccine.  Will discuss this today.  Mood stable on Zoloft.  Urinary issues stable on Flomax.  Patient reports that she had a stroke when she was 72 years of age.  No available medical records.  Patient states that she has a family history of brain bleed/hemorrhagic stroke in her mother as well as her father.  She states that this also occurred in her grandparents.  She states that she has not brought this up to a provider in quite some time as she was previously dismissed, being told that it was very unlikely that she had a stroke at such a young age.  She is concerned about this.  Patient Active Problem List   Diagnosis Date Noted   History of GI diverticular bleed 08/20/2023   History of stroke 08/20/2023   Family history of stroke due to aneurysm 08/20/2023   Urinary retention 02/20/2023   Preventative health care 02/20/2023   Perennial allergic rhinitis 01/01/2022   History of adenomatous polyp of colon 10/31/2017   Arthritis 10/11/2015   Degeneration of lumbar or lumbosacral intervertebral disc 01/16/2009   Bipolar affective disorder (HCC) 09/24/1998    Social Hx   Social History   Socioeconomic History   Marital status: Widowed    Spouse name: Not on file   Number of children: 1   Years of education: Not on file   Highest education level: Some college, no degree  Occupational History   Occupation: Disablity    Comment: Due to back problems  Tobacco Use   Smoking status: Former    Current packs/day: 0.00    Average packs/day: 1 pack/day for 15.0 years (15.0 ttl pk-yrs)    Types: Cigarettes    Start date: 09/25/1983    Quit date: 09/24/1998    Years  since quitting: 24.9   Smokeless tobacco: Never  Vaping Use   Vaping status: Never Used  Substance and Sexual Activity   Alcohol use: No    Alcohol/week: 0.0 standard drinks of alcohol   Drug use: No   Sexual activity: Not on file  Other Topics Concern   Not on file  Social History Narrative   Not on file   Social Determinants of Health   Financial Resource Strain: Low Risk  (08/16/2023)   Overall Financial Resource Strain (CARDIA)    Difficulty of Paying Living Expenses: Not hard at all  Food Insecurity: No Food Insecurity (08/16/2023)   Hunger Vital Sign    Worried About Running Out of Food in the Last Year: Never true    Ran Out of Food in the Last Year: Never true  Transportation Needs: No Transportation Needs (08/16/2023)   PRAPARE - Administrator, Civil Service (Medical): No    Lack of Transportation (Non-Medical): No  Physical Activity: Inactive (08/16/2023)   Exercise Vital Sign    Days of Exercise per Week: 0 days    Minutes of Exercise per Session: 30 min  Stress: Stress Concern Present (08/16/2023)   [employer] of Occupational Health - Occupational Stress Questionnaire    Feeling of Stress : To some extent  Social Connections: Moderately Integrated (08/16/2023)   Social  Connection and Isolation Panel [NHANES]    Frequency of Communication with Friends and Family: More than three times a week    Frequency of Social Gatherings with Friends and Family: More than three times a week    Attends Religious Services: 1 to 4 times per year    Active Member of [employer] or Organizations: No    Attends Engineer, structural: More than 4 times per year    Marital Status: Widowed    Review of Systems Per HPI  Objective:  BP 118/75   Pulse 91   Temp 98.4 F (36.9 C)   Ht 5\' 5"  (1.651 m)   Wt 126 lb 9.6 oz (57.4 kg)   SpO2 93%   BMI 21.07 kg/m      08/20/2023   10:18 AM 05/10/2023   12:33 PM 02/21/2023    2:40 PM  BP/Weight  Systolic BP  118 -- 135  Diastolic BP 75 -- 77  Wt. (Lbs) 126.6 128.4   BMI 21.07 kg/m2 21.37 kg/m2     Physical Exam Vitals and nursing note reviewed.  Constitutional:      General: She is not in acute distress.    Appearance: Normal appearance.  HENT:     Head: Normocephalic and atraumatic.  Eyes:     General:        Right eye: No discharge.        Left eye: No discharge.     Conjunctiva/sclera: Conjunctivae normal.  Cardiovascular:     Rate and Rhythm: Normal rate and regular rhythm.  Pulmonary:     Effort: Pulmonary effort is normal.     Breath sounds: Normal breath sounds. No wheezing, rhonchi or rales.  Neurological:     Mental Status: She is alert.  Psychiatric:        Mood and Affect: Mood normal.        Behavior: Behavior normal.     Lab Results  Component Value Date   WBC 9.5 12/14/2022   HGB 9.6 (L) 12/14/2022   HCT 31.2 (L) 12/14/2022   PLT 351 12/14/2022   GLUCOSE 110 (H) 12/13/2022   CHOL 202 (H) 10/18/2015   TRIG 128 10/18/2015   HDL 57 10/18/2015   LDLCALC 119 (H) 10/18/2015   ALT 12 10/12/2022   AST 22 10/12/2022   NA 139 12/13/2022   K 4.3 12/13/2022   CL 107 12/13/2022   CREATININE 0.71 12/13/2022   BUN 21 12/13/2022   CO2 27 12/13/2022   INR 0.9 10/12/2022     Assessment & Plan:   Problem List Items Addressed This Visit       Genitourinary   Urinary retention    Stable on Flomax.  Continue.      Relevant Medications   tamsulosin (FLOMAX) 0.4 MG CAPS capsule     Other   History of stroke - Primary   Relevant Orders   MR Angiogram Head Wo Contrast   Family history of stroke due to aneurysm    I feel that patient is at high risk.  Needs MRA to assess for underlying aneurysm.      Relevant Orders   MR Angiogram Head Wo Contrast   Bipolar affective disorder (HCC)    Stable.      Other Visit Diagnoses     Immunization due       Relevant Orders   Pneumococcal conjugate vaccine 20-valent (Prevnar 20) (Completed)   Anemia,  unspecified type  Relevant Orders   CBC   Hypocalcemia       Relevant Orders   CMP14+EGFR   Screening for lipid disorders       Relevant Orders   Lipid panel       Meds ordered this encounter  Medications   tamsulosin (FLOMAX) 0.4 MG CAPS capsule    Sig: Take 1 capsule (0.4 mg total) by mouth at bedtime.    Dispense:  90 capsule    Refill:  3    Follow-up:  6 months  Gabriel Paulding Adriana Simas DO [facility]

## 2023-08-20 NOTE — Patient Instructions (Addendum)
Labs ordered.  I will order the MRI.  Continue your medications.  Follow up in 6 months.

## 2023-08-20 NOTE — Assessment & Plan Note (Signed)
Stable on Flomax.  Continue.

## 2023-08-20 NOTE — Assessment & Plan Note (Signed)
I feel that patient is at high risk.  Needs MRA to assess for underlying aneurysm.

## 2023-08-20 NOTE — Progress Notes (Signed)
Endocrinology Consult Note                                            08/20/2023, 4:05 PM   Subjective:    Patient ID: Pamela Henson, female    DOB: [birth year], PCP Tommie Sams, DO   Past Medical History:  Diagnosis Date   Anemia    Bipolar 1 disorder (HCC)    Bipolar 1 disorder (HCC)    Complication of anesthesia    Headache    History of chicken pox    History of measles    History of mumps    PONV (postoperative nausea and vomiting)    Urine retention    takes Flomax   Past Surgical History:  Procedure Laterality Date   ABDOMINAL HYSTERECTOMY  1979   age 28 due to vaginal bleeding   COLONOSCOPY N/A 10/30/2017   Procedure: COLONOSCOPY;  Surgeon: Malissa Hippo, MD;  Location: [facility];  Service: Endoscopy;  Laterality: N/A;  1225   COLONOSCOPY WITH PROPOFOL N/A 10/13/2022   Procedure: COLONOSCOPY WITH PROPOFOL;  Surgeon: Dolores Frame, MD;  Location: [facility];  Service: Gastroenterology;  Laterality: N/A;   ESOPHAGOGASTRODUODENOSCOPY (EGD) WITH PROPOFOL N/A 10/13/2022   Procedure: ESOPHAGOGASTRODUODENOSCOPY (EGD) WITH PROPOFOL;  Surgeon: Dolores Frame, MD;  Location: [facility];  Service: Gastroenterology;  Laterality: N/A;   ORIF TIBIA PLATEAU Right 12/12/2022   Procedure: OPEN REDUCTION INTERNAL FIXATION (ORIF) TIBIAL PLATEAU;  Surgeon: Roby Lofts, MD;  Location: [facility];  Service: Orthopedics;  Laterality: Right;   POLYPECTOMY  10/13/2022   Procedure: POLYPECTOMY;  Surgeon: Dolores Frame, MD;  Location: [facility];  Service: Gastroenterology;;   Social History   Socioeconomic History   Marital status: Widowed    Spouse name: Not on file   Number of children: 1   Years of education: Not on file   Highest education level: Some college, no degree  Occupational History   Occupation: Disablity    Comment: Due to back problems  Tobacco Use   Smoking status: Former    Current packs/day: 0.00    Average  packs/day: 1 pack/day for 15.0 years (15.0 ttl pk-yrs)    Types: Cigarettes    Start date: 09/25/1983    Quit date: 09/24/1998    Years since quitting: 24.9   Smokeless tobacco: Never  Vaping Use   Vaping status: Never Used  Substance and Sexual Activity   Alcohol use: No    Alcohol/week: 0.0 standard drinks of alcohol   Drug use: No   Sexual activity: Not on file  Other Topics Concern   Not on file  Social History Narrative   Not on file   Social Determinants of Health   Financial Resource Strain: Low Risk  (08/16/2023)   Overall Financial Resource Strain (CARDIA)    Difficulty of Paying Living Expenses: Not hard at all  Food Insecurity: No Food Insecurity (08/16/2023)   Hunger Vital Sign    Worried About Running Out of Food in the Last Year: Never true    Ran Out of Food in the Last Year: Never true  Transportation Needs: No Transportation Needs (08/16/2023)   PRAPARE - Administrator, Civil Service (Medical): No    Lack of Transportation (Non-Medical): No  Physical Activity: Inactive (08/16/2023)  Exercise Vital Sign    Days of Exercise per Week: 0 days    Minutes of Exercise per Session: 30 min  Stress: Stress Concern Present (08/16/2023)   [employer] of Occupational Health - Occupational Stress Questionnaire    Feeling of Stress : To some extent  Social Connections: Moderately Integrated (08/16/2023)   Social Connection and Isolation Panel [NHANES]    Frequency of Communication with Friends and Family: More than three times a week    Frequency of Social Gatherings with Friends and Family: More than three times a week    Attends Religious Services: 1 to 4 times per year    Active Member of [employer] or Organizations: No    Attends Engineer, structural: More than 4 times per year    Marital Status: Widowed   Family History  Problem Relation Age of Onset   Stroke Mother 50   Stroke Father 27   Colon cancer Neg Hx    Outpatient Encounter  Medications as of 08/20/2023  Medication Sig   acetaminophen (TYLENOL) 500 MG tablet Take 500 mg by mouth every 6 (six) hours as needed for mild pain, moderate pain or headache.   gabapentin (NEURONTIN) 100 MG capsule Take 100 mg by mouth 3 (three) times daily.   Multiple Vitamins-Minerals (MULTIVITAMIN WITH MINERALS) tablet Take 1 tablet by mouth daily.   sertraline (ZOLOFT) 50 MG tablet Take 50 mg by mouth daily. hs   [DISCONTINUED] tamsulosin (FLOMAX) 0.4 MG CAPS capsule Take 1 capsule (0.4 mg total) by mouth daily. (Patient taking differently: Take 0.4 mg by mouth at bedtime.)   No facility-administered encounter medications on file as of 08/20/2023.   ALLERGIES: Allergies  Allergen Reactions   Asa [Aspirin] Other (See Comments)    Had GI bleed 09/2022    VACCINATION STATUS: Immunization History  Administered Date(s) Administered   Moderna Covid-19 Fall Seasonal Vaccine 33yrs & older 08/17/2023   PNEUMOCOCCAL CONJUGATE-20 08/20/2023    HPI Pamela Henson is 72 y.o. female who presents today with a medical history as above. she is being seen in consultation for osteoporosis requested by Tommie Sams, DO.  History is obtained directly from the patient as well as chart review. Patient did have right lower extremity fracture in March 2024 due to a fall from stool she was standing on to fix something overhead.  It was reported as closed bicondylar fracture. Subsequently, she was offered screening bone density which she had on July 31, 2023 showing negative T-score of -3.1 on [facility] spine, -4.3 on right femur, and -4.0 on dual femur total mean. She was not previously diagnosed with osteoporosis and this was her first bone density.  She is a former smoker.  She denies any exposure to high-dose steroids.  She denies height loss. Her records indicate unexplained, chronic hypocalcemia. She denies any thyroid/parathyroid dysfunctions. She is on regular vitamin D supplement. Her other  medical problems include anemia, mood disorders, arthritis, and degenerative lumbosacral disc. -She does not follow any particular diet or exercise regimen.  She does not report recent weight change, reports to have been normal with slight weight with current BMI of 21. Her daily and dietary intake of calcium seems to be appropriate.   Review of Systems  Constitutional: +mildly fluctuating body weight , no fatigue, no subjective hyperthermia, no subjective hypothermia Eyes: no blurry vision, no xerophthalmia ENT: no sore throat, no nodules palpated in throat, no dysphagia/odynophagia, no hoarseness Cardiovascular: no Chest Pain, no Shortness of Breath,  no palpitations, no leg swelling Respiratory: no cough, no shortness of breath Gastrointestinal: no Nausea/Vomiting/Diarhhea Musculoskeletal: no muscle/joint aches Skin: no rashes Neurological: no tremors, no numbness, no tingling, no dizziness Psychiatric: no depression, no anxiety  Objective:       08/20/2023    3:58 PM 08/20/2023   10:18 AM 05/10/2023   12:33 PM  Vitals with BMI  Height 5\' 5"  5\' 5"  5\' 5"   Weight  126 lbs 10 oz 128 lbs 6 oz  BMI  21.07 21.37  Systolic  118 --  Diastolic  75 --  Pulse  91     Ht 5\' 5"  (1.651 m)   BMI 21.07 kg/m   Wt Readings from Last 3 Encounters:  08/20/23 126 lb 9.6 oz (57.4 kg)  05/10/23 128 lb 6.4 oz (58.2 kg)  02/19/23 127 lb 3.2 oz (57.7 kg)    Physical Exam  Constitutional:  Body mass index is 21.07 kg/m.,  not in acute distress, normal state of mind Eyes: PERRLA, EOMI, no exophthalmos ENT: moist mucous membranes, no gross thyromegaly, no gross cervical lymphadenopathy Cardiovascular: normal precordial activity, Regular Rate and Rhythm, no Murmur/Rubs/Gallops Respiratory:  adequate breathing efforts, no gross chest deformity, Clear to auscultation bilaterally Gastrointestinal: abdomen soft, Non -tender, No distension, Bowel Sounds present, no gross  organomegaly Musculoskeletal: + Mild to moderate kyphotic deformity of thoracolumbar spine,  strength intact in all four extremities, no peripheral edema Skin: moist, warm, no rashes Neurological: no tremor with outstretched hands, Deep tendon reflexes normal in bilateral lower extremities.  CMP ( most recent) CMP     Component Value Date/Time   NA 139 12/13/2022 0222   NA 144 10/18/2015 0818   K 4.3 12/13/2022 0222   CL 107 12/13/2022 0222   CO2 27 12/13/2022 0222   GLUCOSE 110 (H) 12/13/2022 0222   BUN 21 12/13/2022 0222   BUN 16 10/18/2015 0818   CREATININE 0.71 12/13/2022 0222   CALCIUM 8.2 (L) 12/13/2022 0222   PROT 6.2 (L) 10/12/2022 0305   ALBUMIN 3.2 (L) 10/12/2022 0305   AST 22 10/12/2022 0305   ALT 12 10/12/2022 0305   ALKPHOS 92 10/12/2022 0305   BILITOT 0.6 10/12/2022 0305   GFRNONAA >60 12/13/2022 0222    Lipid Panel     Component Value Date/Time   CHOL 202 (H) 10/18/2015 0818   TRIG 128 10/18/2015 0818   HDL 57 10/18/2015 0818   CHOLHDL 3.5 10/18/2015 0818   LDLCALC 119 (H) 10/18/2015 0818   LABVLDL 26 10/18/2015 0818     [facility] Spine L1-L3 (L2) 07/31/2023 70.5 Osteoporosis -3.1 0.789 g/cm2 - -   DualFemur Total Right 07/31/2023 70.5 Osteoporosis -4.3 0.461 g/cm2 - -   DualFemur Total Mean 07/31/2023 70.5 Osteoporosis -4.0 0.499 g/cm2 - - ASSESSMENT: The BMD measured at Femur Total Right is 0.461 g/cm2 with a T-score of -4.3. This patient is considered osteoporotic according to World Health Organization [facility]) criteria. The scan quality is good. L2 and L4 were excluded due to advanced degenerative changes.  Assessment & Plan:   Severe osteoporosis -spine/hip   2.  Hypocalcemia    3.  Vitamin D deficiency  - Pamela Henson  is being seen at a kind request of Tommie Sams, DO. - I have reviewed her available  records and clinically evaluated the patient. - Based on these reviews, she has severe osteoporosis of the spine, femur, and hip. She also  has unexplained chronic hypocalcemia and vitamin D deficiency.  She will need  some workup for secondary osteoporosis before treatment decisions.  I discussed and ordered 24-hour urine calcium collection, PTH/calcium, magnesium, phosphorus, thyroid function test. -she has high risk for fracture which was discussed with her. Treatment options will include more powerful options including IV Reclast or Prolia. -She is encouraged to stay on vitamin D3 for now.  She will need at least 5000 units daily. She will also need calcium supplement after reviewing her next labs.  - she is advised to maintain close follow up with Tommie Sams, DO for primary care needs.   -Thank you for involving me in the care of this pleasant patient.  Time spent with the patient: 46  minutes, of which >50% was spent in  counseling her about her osteoporosis, vitamin D deficiency, hypocalcemia and the rest in obtaining information about her symptoms, reviewing her previous labs/studies ( including abstractions from other facilities),  evaluations, and treatments,  and developing a plan to confirm diagnosis and long term treatment based on the latest standards of care/guidelines; and documenting her care.  Sallye Lat Crouse participated in the discussions, expressed understanding, and voiced agreement with the above plans.  All questions were answered to her satisfaction. she is encouraged to contact clinic should she have any questions or concerns prior to her return visit.  Follow up plan: No follow-ups on file.   Marquis Lunch, MD [facility] [address] [place], [place] [address] Phone: [phone]  Fax: [phone]     08/20/2023, 4:05 PM  This note was partially dictated with voice recognition software. Similar sounding words can be transcribed inadequately or may not  be corrected upon review.

## 2023-08-21 DIAGNOSIS — M810 Age-related osteoporosis without current pathological fracture: Secondary | ICD-10-CM | POA: Diagnosis not present

## 2023-08-21 LAB — LIPID PANEL
Chol/HDL Ratio: 3.4 {ratio} (ref 0.0–4.4)
Cholesterol, Total: 189 mg/dL (ref 100–199)
HDL: 56 mg/dL (ref >39)
LDL Chol Calc (NIH): 111 mg/dL — ABNORMAL HIGH (ref 0–99)
Triglycerides: 124 mg/dL (ref 0–149)
VLDL Cholesterol Cal: 22 mg/dL (ref 5–40)

## 2023-08-21 LAB — CBC
Hematocrit: 46.4 % (ref 34.0–46.6)
Hemoglobin: 15 g/dL (ref 11.1–15.9)
MCH: 30.8 pg (ref 26.6–33.0)
MCHC: 32.3 g/dL (ref 31.5–35.7)
MCV: 95 fL (ref 79–97)
Platelets: 228 10*3/uL (ref 150–450)
RBC: 4.87 x10E6/uL (ref 3.77–5.28)
RDW: 12.6 % (ref 11.7–15.4)
WBC: 6.5 10*3/uL (ref 3.4–10.8)

## 2023-08-21 LAB — CMP14+EGFR
ALT: 7 [IU]/L (ref 0–32)
AST: 23 [IU]/L (ref 0–40)
Albumin: 4.1 g/dL (ref 3.9–4.9)
Alkaline Phosphatase: 111 [IU]/L (ref 44–121)
BUN/Creatinine Ratio: 16 (ref 12–28)
BUN: 12 mg/dL (ref 8–27)
Bilirubin Total: <0.2 mg/dL (ref 0.0–1.2)
CO2: 26 mmol/L (ref 20–29)
Calcium: 9.8 mg/dL (ref 8.7–10.3)
Chloride: 107 mmol/L — ABNORMAL HIGH (ref 96–106)
Creatinine, Ser: 0.73 mg/dL (ref 0.57–1.00)
Globulin, Total: 2.4 g/dL (ref 1.5–4.5)
Glucose: 99 mg/dL (ref 70–99)
Potassium: 4.7 mmol/L (ref 3.5–5.2)
Sodium: 147 mmol/L — ABNORMAL HIGH (ref 134–144)
Total Protein: 6.5 g/dL (ref 6.0–8.5)
eGFR: 88 mL/min/{1.73_m2} (ref >59)

## 2023-08-23 ENCOUNTER — Ambulatory Visit (HOSPITAL_COMMUNITY)
Admission: RE | Admit: 2023-08-23 | Discharge: 2023-08-23 | Disposition: A | Discharge status: Home / Self Care | Payer: Medicare Other | Source: Ambulatory Visit | Attending: Family Medicine | Admitting: Family Medicine

## 2023-08-23 DIAGNOSIS — Z8673 Personal history of transient ischemic attack (TIA), and cerebral infarction without residual deficits: Secondary | ICD-10-CM | POA: Diagnosis not present

## 2023-08-23 DIAGNOSIS — I671 Cerebral aneurysm, nonruptured: Secondary | ICD-10-CM | POA: Diagnosis not present

## 2023-08-23 DIAGNOSIS — Z136 Encounter for screening for cardiovascular disorders: Secondary | ICD-10-CM | POA: Diagnosis not present

## 2023-08-23 DIAGNOSIS — M810 Age-related osteoporosis without current pathological fracture: Secondary | ICD-10-CM | POA: Diagnosis not present

## 2023-08-23 DIAGNOSIS — Z823 Family history of stroke: Secondary | ICD-10-CM | POA: Diagnosis not present

## 2023-08-23 LAB — PTH, INTACT AND CALCIUM
Calcium: 9.4 mg/dL (ref 8.7–10.3)
PTH: 25 pg/mL (ref 15–65)

## 2023-08-23 LAB — TSH: TSH: 1.44 u[IU]/mL (ref 0.450–4.500)

## 2023-08-23 LAB — T4, FREE: Free T4: 1.3 ng/dL (ref 0.82–1.77)

## 2023-08-23 LAB — MAGNESIUM: Magnesium: 2.2 mg/dL (ref 1.6–2.3)

## 2023-08-23 LAB — PHOSPHORUS: Phosphorus: 3.3 mg/dL (ref 3.0–4.3)

## 2023-08-24 LAB — CALCIUM, URINE, 24 HOUR
Calcium, 24H Urine: 393 mg/(24.h) — ABNORMAL HIGH (ref 0–320)
Calcium, Urine: 23.1 mg/dL

## 2023-08-24 LAB — CREATININE, URINE, 24 HOUR
Creatinine, 24H Ur: 813 mg/(24.h) (ref 800–1800)
Creatinine, Urine: 47.8 mg/dL

## 2023-08-27 ENCOUNTER — Ambulatory Visit (HOSPITAL_COMMUNITY)
Admission: RE | Admit: 2023-08-27 | Discharge: 2023-08-27 | Disposition: A | Discharge status: Home / Self Care | Payer: Medicare Other | Source: Ambulatory Visit | Attending: Family Medicine | Admitting: Family Medicine

## 2023-08-27 ENCOUNTER — Encounter (HOSPITAL_COMMUNITY): Payer: Self-pay

## 2023-08-27 ENCOUNTER — Other Ambulatory Visit (HOSPITAL_COMMUNITY): Payer: Self-pay | Admitting: Family Medicine

## 2023-08-27 DIAGNOSIS — Z1231 Encounter for screening mammogram for malignant neoplasm of breast: Secondary | ICD-10-CM

## 2023-08-27 DIAGNOSIS — N63 Unspecified lump in unspecified breast: Secondary | ICD-10-CM | POA: Insufficient documentation

## 2023-08-27 DIAGNOSIS — R92333 Mammographic heterogeneous density, bilateral breasts: Secondary | ICD-10-CM | POA: Diagnosis not present

## 2023-08-27 DIAGNOSIS — Z0000 Encounter for general adult medical examination without abnormal findings: Secondary | ICD-10-CM | POA: Insufficient documentation

## 2023-08-27 DIAGNOSIS — N6325 Unspecified lump in the left breast, overlapping quadrants: Secondary | ICD-10-CM | POA: Insufficient documentation

## 2023-08-27 DIAGNOSIS — N6321 Unspecified lump in the left breast, upper outer quadrant: Secondary | ICD-10-CM | POA: Diagnosis not present

## 2023-08-28 NOTE — Progress Notes (Signed)
It is linked in the order and in my note. History of stroke, family history of cerebral aneurysm.

## 2023-08-30 ENCOUNTER — Ambulatory Visit
Admission: RE | Admit: 2023-08-30 | Discharge: 2023-08-30 | Disposition: A | Discharge status: Home / Self Care | Payer: Medicare Other | Source: Ambulatory Visit | Attending: Family Medicine | Admitting: Family Medicine

## 2023-08-30 DIAGNOSIS — C50812 Malignant neoplasm of overlapping sites of left female breast: Secondary | ICD-10-CM | POA: Diagnosis not present

## 2023-08-30 DIAGNOSIS — Z1231 Encounter for screening mammogram for malignant neoplasm of breast: Secondary | ICD-10-CM

## 2023-08-30 DIAGNOSIS — N63 Unspecified lump in unspecified breast: Secondary | ICD-10-CM

## 2023-08-30 DIAGNOSIS — N6325 Unspecified lump in the left breast, overlapping quadrants: Secondary | ICD-10-CM | POA: Diagnosis not present

## 2023-08-30 HISTORY — PX: BREAST BIOPSY: SHX20

## 2023-09-02 ENCOUNTER — Other Ambulatory Visit: Payer: Self-pay | Admitting: Family Medicine

## 2023-09-02 DIAGNOSIS — C50912 Malignant neoplasm of unspecified site of left female breast: Secondary | ICD-10-CM

## 2023-09-02 LAB — SURGICAL PATHOLOGY

## 2023-09-03 ENCOUNTER — Encounter: Payer: Self-pay | Admitting: *Deleted

## 2023-09-03 ENCOUNTER — Other Ambulatory Visit: Payer: Medicare Other

## 2023-09-03 DIAGNOSIS — C50412 Malignant neoplasm of upper-outer quadrant of left female breast: Secondary | ICD-10-CM | POA: Insufficient documentation

## 2023-09-03 NOTE — Progress Notes (Signed)
[facility] [address] [place] [address]   Clinic Day:  09/04/2023  Referring physician: Tommie Sams, DO  Patient Care Team: Tommie Sams, DO as PCP - General (Family Medicine) Andee Poles, MD as Consulting Physician (Psychiatry) Deatra Robinson, NP as Nurse Practitioner (Psychiatry) Doreatha Massed, MD as Medical Oncologist (Medical Oncology) Therese Sarah, RN as Oncology Nurse Navigator (Medical Oncology)   ASSESSMENT & PLAN:   Assessment:  1.  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 - Bilateral diagnostic mammogram/ultrasound (08/27/2023): 2.1 cm mass in the left breast 3 o'clock position.  No lymphadenopathy in the left axilla. 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  Ki-67 5%.  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  2.  Social/family history: - Lives at home by herself and is independent of ADLs and IADLs.  She retired after working at [employer].  She quit smoking 1 year ago.  Smoked half pack per day on and off since age 73.  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 - Paternal aunt died of cancer.  Type not known to the patient.  Plan:  1.  Left breast ER/HER2 positive IDC: - We discussed the results of the imaging.  We discussed pathology reports in detail. - I do not recommend neoadjuvant chemotherapy.  She may proceed with surgery.  We discussed options including lumpectomies and mastectomy with SLNB.  She is leaning towards lumpectomy.  She will discuss further with surgery.  She is seeing them next Tuesday. - I will order CT scan of the chest with contrast.  We will also obtain a baseline 2D echocardiogram as her tumor is HER2 positive. - RTC 4 weeks after surgery.  2.  Bone health: - Will order bone density test.  Will also check vitamin D levels at some point.  3.   History of stroke and DVT: - Reportedly had stroke at age 36.  She called back after her visit today and informed us that she had DVT in March 2024 after fracture.  She was treated with Eliquis and was taken off due to side effects.  However repeat ultrasound was negative for DVT. - Will consider antiphospholipid antibody testing at some point.   Orders Placed This Encounter  Procedures   CT Chest W Contrast    Standing Status:   Future    Standing Expiration Date:   09/03/2024    Order Specific Question:   If indicated for the ordered procedure, I authorize the administration of contrast media per Radiology protocol    Answer:   Yes    Order Specific Question:   Does the patient have a contrast media/X-ray dye allergy?    Answer:   No    Order Specific Question:   Preferred imaging location?    Answer:   [facility]    Order Specific Question:   Release to patient    Answer:   Immediate [1]   ECHOCARDIOGRAM COMPLETE    Standing Status:   Future    Standing Expiration Date:   09/03/2024    Order Specific Question:   Where should this test be performed    Answer:   [facility]    Order Specific Question:   Perflutren DEFINITY (image enhancing agent) should be administered unless hypersensitivity or  allergy exist    Answer:   Administer Perflutren    Order Specific Question:   Reason for exam-Echo    Answer:   Chemo  Z09    Order Specific Question:   Release to patient    Answer:   Immediate      I,Katie Daubenspeck,acting as a scribe for Doreatha Massed, MD.,have documented all relevant documentation on the behalf of Doreatha Massed, MD,as directed by  Doreatha Massed, MD while in the presence of Doreatha Massed, MD.   I, Doreatha Massed MD, have reviewed the above documentation for accuracy and completeness, and I agree with the above.   Doreatha Massed, MD   12/11/20244:29 PM  CHIEF COMPLAINT/PURPOSE OF CONSULT:   Diagnosis: left breast  cancer, ER+/HER2+   Cancer Staging  Breast cancer of upper-outer quadrant of left female breast [facility]) Staging form: Breast, AJCC 8th Edition - Clinical stage from 09/03/2023: cT2, cN0, cM0, G2, PR-, HER2+ - Unsigned    Prior Therapy: none  Current Therapy: Under workup   HISTORY OF PRESENT ILLNESS:   Oncology History   No history exists.      Pamela Henson is a 72 y.o. female presenting to clinic today for evaluation of left breast cancer, ER+/HER2+ at the request of Dr. Adriana Simas.  She presented for routine mammogram on 08/27/23 with a palpable area in her left breast. She proceeded with diagnostic bilateral mammogram and left breast US showing: suspicious palpable 2.1 cm mass in left breast at 3 o'clock.  She underwent left breast biopsy on 08/30/23. Pathology showed: invasive ductal carcinoma, grade 2; DCIS, intermediate grade, without necrosis. Prognostic panel significant for: ER 95% positive, PR 0% negative, HER2 positive (3+), with Ki-67 of 5%.  Today, she states that she is doing well overall. Her appetite level is at 100%. Her energy level is at 100%.  PAST MEDICAL HISTORY:   Past Medical History: Past Medical History:  Diagnosis Date   Anemia    Bipolar 1 disorder (HCC)    Bipolar 1 disorder (HCC)    Complication of anesthesia    Headache    History of chicken pox    History of measles    History of mumps    PONV (postoperative nausea and vomiting)    Urine retention    takes Flomax    Surgical History: Past Surgical History:  Procedure Laterality Date   ABDOMINAL HYSTERECTOMY  09/24/1977   age 70 due to vaginal bleeding   BREAST BIOPSY Left 08/30/2023   [place] LT BREAST BX W LOC DEV 1ST LESION IMG BX SPEC US GUIDE 08/30/2023 [facility]   COLONOSCOPY N/A 10/30/2017   Procedure: COLONOSCOPY;  Surgeon: Malissa Hippo, MD;  Location: [facility];  Service: Endoscopy;  Laterality: N/A;  1225   COLONOSCOPY WITH PROPOFOL N/A 10/13/2022   Procedure: COLONOSCOPY WITH  PROPOFOL;  Surgeon: Dolores Frame, MD;  Location: [facility];  Service: Gastroenterology;  Laterality: N/A;   ESOPHAGOGASTRODUODENOSCOPY (EGD) WITH PROPOFOL N/A 10/13/2022   Procedure: ESOPHAGOGASTRODUODENOSCOPY (EGD) WITH PROPOFOL;  Surgeon: Dolores Frame, MD;  Location: [facility];  Service: Gastroenterology;  Laterality: N/A;   ORIF TIBIA PLATEAU Right 12/12/2022   Procedure: OPEN REDUCTION INTERNAL FIXATION (ORIF) TIBIAL PLATEAU;  Surgeon: Roby Lofts, MD;  Location: [facility] OR;  Service: Orthopedics;  Laterality: Right;   PERIPHERAL VASCULAR THROMBECTOMY Right 11/2022   2 DVT following surgical intervention of fracture   POLYPECTOMY  10/13/2022   Procedure: POLYPECTOMY;  Surgeon: Dolores Frame, MD;  Location: [facility];  Service: Gastroenterology;;    Social History: Social History   Socioeconomic History   Marital status: Widowed    Spouse name: Not on file   Number of children: 1   Years of education: Not on file   Highest education level: Some college, no degree  Occupational History   Occupation: Disablity    Comment: Due to back problems  Tobacco Use   Smoking status: Former    Current packs/day: 0.00    Average packs/day: 1 pack/day for 15.0 years (15.0 ttl pk-yrs)    Types: Cigarettes    Start date: 09/25/1983    Quit date: 09/24/1998    Years since quitting: 24.9   Smokeless tobacco: Never  Vaping Use   Vaping status: Never Used  Substance and Sexual Activity   Alcohol use: No    Alcohol/week: 0.0 standard drinks of alcohol   Drug use: No   Sexual activity: Not on file  Other Topics Concern   Not on file  Social History Narrative   Not on file   Social Determinants of Health   Financial Resource Strain: Low Risk  (08/16/2023)   Overall Financial Resource Strain (CARDIA)    Difficulty of Paying Living Expenses: Not hard at all  Food Insecurity: No Food Insecurity (08/16/2023)   Hunger Vital Sign    Worried About  Running Out of Food in the Last Year: Never true    Ran Out of Food in the Last Year: Never true  Transportation Needs: No Transportation Needs (08/16/2023)   PRAPARE - Administrator, Civil Service (Medical): No    Lack of Transportation (Non-Medical): No  Physical Activity: Inactive (08/16/2023)   Exercise Vital Sign    Days of Exercise per Week: 0 days    Minutes of Exercise per Session: 30 min  Stress: Stress Concern Present (08/16/2023)   [employer] of Occupational Health - Occupational Stress Questionnaire    Feeling of Stress : To some extent  Social Connections: Moderately Integrated (08/16/2023)   Social Connection and Isolation Panel [NHANES]    Frequency of Communication with Friends and Family: More than three times a week    Frequency of Social Gatherings with Friends and Family: More than three times a week    Attends Religious Services: 1 to 4 times per year    Active Member of [employer] or Organizations: No    Attends Engineer, structural: More than 4 times per year    Marital Status: Widowed  Intimate Partner Violence: Not At Risk (05/10/2023)   Humiliation, Afraid, Rape, and Kick questionnaire    Fear of Current or Ex-Partner: No    Emotionally Abused: No    Physically Abused: No    Sexually Abused: No    Family History: Family History  Problem Relation Age of Onset   Stroke Mother 37   Stroke Father 1   Colon cancer Neg Hx     Current Medications:  Current Outpatient Medications:    acetaminophen (TYLENOL) 500 MG tablet, Take 500 mg by mouth every 6 (six) hours as needed for mild pain, moderate pain or headache., Disp: , Rfl:    cholecalciferol (VITAMIN D3) 25 MCG (1000 UNIT) tablet, Take 1,000 Units by mouth daily., Disp: , Rfl:    gabapentin (NEURONTIN) 100 MG capsule, Take 100 mg by mouth 3 (three) times daily., Disp: , Rfl:    Multiple Vitamins-Minerals (MULTIVITAMIN WITH MINERALS) tablet, Take 1 tablet by mouth daily., Disp: ,  Rfl:    sertraline (ZOLOFT) 50 MG tablet, Take 50 mg by mouth daily. hs, Disp: , Rfl:    tamsulosin (FLOMAX) 0.4 MG CAPS capsule, Take 1 capsule (0.4 mg total) by mouth at bedtime., Disp: 90 capsule, Rfl: 3   Allergies: Allergies  Allergen Reactions   Asa [Aspirin] Other (See Comments)    Had GI bleed 09/2022    REVIEW OF SYSTEMS:   Review of Systems  Cardiovascular:  Positive for palpitations.  All other systems reviewed and are negative.    VITALS:   Blood pressure (!) 140/75, pulse (!) 102, temperature (!) 97.2 F (36.2 C), temperature source Tympanic, resp. rate 20, height 5' 3.39" (1.61 m), weight 124 lb 6.4 oz (56.4 kg), SpO2 92%.  Wt Readings from Last 3 Encounters:  09/04/23 124 lb 6.4 oz (56.4 kg)  08/20/23 126 lb 6.4 oz (57.3 kg)  08/20/23 126 lb 9.6 oz (57.4 kg)    Body mass index is 21.77 kg/m.  Performance status (ECOG): 1 - Symptomatic but completely ambulatory  PHYSICAL EXAM:   Physical Exam Vitals reviewed.  Constitutional:      Appearance: Normal appearance.  Cardiovascular:     Rate and Rhythm: Normal rate and regular rhythm.     Heart sounds: Normal heart sounds.  Pulmonary:     Effort: Pulmonary effort is normal.     Breath sounds: Normal breath sounds.  Abdominal:     General: There is no distension.     Palpations: Abdomen is soft. There is no mass.  Musculoskeletal:     Right lower leg: No edema.     Left lower leg: No edema.  Neurological:     Mental Status: She is alert.  Psychiatric:        Mood and Affect: Mood normal.        Behavior: Behavior normal.   Breast exam: 2 to 3 cm mass in the left breast upper outer quadrant freely mobile.  No other masses palpable.  No adenopathy palpable.  LABS:      Latest Ref Rng & Units 08/20/2023   11:22 AM 12/14/2022    3:16 AM 12/13/2022    6:20 AM  CBC  WBC 3.4 - 10.8 x10E3/uL 6.5  9.5  10.0   Hemoglobin 11.1 - 15.9 g/dL 52.8  9.6  9.6   Hematocrit 34.0 - 46.6 % 46.4  31.2  31.9    Platelets 150 - 450 x10E3/uL 228  351  329       Latest Ref Rng & Units 08/21/2023    9:29 AM 08/20/2023   11:22 AM 12/13/2022    2:22 AM  CMP  Glucose 70 - 99 mg/dL  99  413   BUN 8 - 27 mg/dL  12  21   Creatinine 2.44 - 1.00 mg/dL  0.10  2.72   Sodium 536 - 144 mmol/L  147  139   Potassium 3.5 - 5.2 mmol/L  4.7  4.3   Chloride 96 - 106 mmol/L  107  107   CO2 20 - 29 mmol/L  26  27   Calcium 8.7 - 10.3 mg/dL 9.4  9.8  8.2   Total Protein 6.0 - 8.5 g/dL  6.5    Total Bilirubin 0.0 - 1.2 mg/dL  <6.4    Alkaline Phos 44 - 121 IU/L  111    AST 0 - 40 IU/L  23    ALT 0 - 32 IU/L  7  No results found for: "CEA1", "CEA" / No results found for: "CEA1", "CEA" No results found for: "PSA1" No results found for: "NGE952" No results found for: "CAN125"  No results found for: "TOTALPROTELP", "ALBUMINELP", "A1GS", "A2GS", "BETS", "BETA2SER", "GAMS", "MSPIKE", "SPEI" No results found for: "TIBC", "FERRITIN", "IRONPCTSAT" No results found for: "LDH"   STUDIES:   [place] LT BREAST BX W LOC DEV 1ST LESION IMG BX SPEC US GUIDE  Addendum Date: 09/02/2023   ADDENDUM REPORT: 09/02/2023 15:23 ADDENDUM: Pathology revealed GRADE II INVASIVE DUCTAL CARCINOMA, DUCTAL CARCINOMA IN SITU, SOLID AND CRIBRIFORM, INTERMEDIATE NUCLEAR GRADE, WITHOUT NECROSIS of the LEFT breast, outer, 3 o'clock, (heart clip). This was found to be concordant by Dr. Laveda Abbe. Pathology results were discussed with the patient by telephone. The patient reported doing well after the biopsy with tenderness and mild swelling at the site. Post biopsy instructions and care were reviewed and questions were answered. The patient was encouraged to call [facility] for any additional concerns. My direct phone number was provided. Pathology results were sent to Dr. Everlene Other, with [facility], via secure EPIC message on September 02, 2023. Dr. Adriana Simas will arrange referrals in [place], [place],  per patient request. Pathology results reported by Rene Kocher, RN on 09/02/2023. Electronically Signed   By: Harmon Pier M.D.   On: 09/02/2023 15:23   Result Date: 09/02/2023 CLINICAL DATA:  72 year old female presents for tissue sampling of 2.1 cm OUTER LEFT breast mass. EXAM: ULTRASOUND GUIDED LEFT BREAST CORE NEEDLE BIOPSY COMPARISON:  Previous exam(s). PROCEDURE: I met with the patient and we discussed the procedure of ultrasound-guided biopsy, including benefits and alternatives. We discussed the high likelihood of a successful procedure. We discussed the risks of the procedure, including infection, bleeding, tissue injury, clip migration, and inadequate sampling. Informed written consent was given. The usual time-out protocol was performed immediately prior to the procedure. Using sterile technique and 1% Lidocaine as local anesthetic, under direct ultrasound visualization, a 12 gauge spring-loaded device was used to perform biopsy of the 2.1 cm mass at the 3 o'clock position of the LEFT breast using a MEDIAL approach. At the conclusion of the procedure a HEART shaped tissue marker clip was deployed into the biopsy cavity. Follow up 2 view mammogram was performed and dictated separately. IMPRESSION: Ultrasound guided biopsy of 2.1 cm OUTER LEFT breast mass. No apparent complications. Electronically Signed: By: Harmon Pier M.D. On: 08/30/2023 08:07   MM CLIP PLACEMENT LEFT  Result Date: 08/30/2023 CLINICAL DATA:  Evaluate HEART biopsy clip placement following ultrasound-guided LEFT breast biopsy. EXAM: 3D DIAGNOSTIC LEFT MAMMOGRAM POST ULTRASOUND BIOPSY COMPARISON:  Previous exam(s). FINDINGS: 3D Mammographic images were obtained following ultrasound guided biopsy of the 2.1 cm mass at the 3 o'clock position of the LEFT breast. The biopsy marking clip is in expected position at the site of biopsy. IMPRESSION: Appropriate positioning of the HEART shaped biopsy marking clip at the site of biopsy in the  OUTER LEFT breast. Final Assessment: Post Procedure Mammograms for Marker Placement Electronically Signed   By: Harmon Pier M.D.   On: 08/30/2023 08:21   MM 3D DIAGNOSTIC MAMMOGRAM BILATERAL BREAST  Result Date: 08/27/2023 CLINICAL DATA:  72 year old female with a palpable area of concern in the left breast. EXAM: DIGITAL DIAGNOSTIC BILATERAL MAMMOGRAM WITH TOMOSYNTHESIS AND CAD; ULTRASOUND LEFT BREAST LIMITED TECHNIQUE: Bilateral digital diagnostic mammography and breast tomosynthesis was performed. The images were evaluated with computer-aided detection. ; Targeted ultrasound examination of  the left breast was performed. COMPARISON:  Previous exam(s). ACR Breast Density Category c: The breasts are heterogeneously dense, which may obscure small masses. FINDINGS: No suspicious masses or calcifications are seen in the right breast. There is a spiculated mass corresponding to the area of palpable concern in the upper-outer left breast measuring 2.1 cm. Targeted ultrasound of the left breast was performed. There is an irregular hypoechoic mass in the left breast at 3 o'clock 6 cm from nipple measuring 1.4 x 1.6 x 2.1 cm. This corresponds well with the mass seen in the left breast at mammography. No lymphadenopathy seen in the left axilla. IMPRESSION: Suspicious palpable 2.1 cm mass in the left breast the 3 o'clock position. RECOMMENDATION: Recommend ultrasound-guided core biopsy of the mass in the left breast at the 3 o'clock position. I have discussed the findings and recommendations with the patient. If applicable, a reminder letter will be sent to the patient regarding the next appointment. BI-RADS CATEGORY  5: Highly suggestive of malignancy. Electronically Signed   By: Edwin Cap M.D.   On: 08/27/2023 15:58   [place] LIMITED ULTRASOUND INCLUDING AXILLA LEFT BREAST   Result Date: 08/27/2023 CLINICAL DATA:  72 year old female with a palpable area of concern in the left breast. EXAM: DIGITAL DIAGNOSTIC  BILATERAL MAMMOGRAM WITH TOMOSYNTHESIS AND CAD; ULTRASOUND LEFT BREAST LIMITED TECHNIQUE: Bilateral digital diagnostic mammography and breast tomosynthesis was performed. The images were evaluated with computer-aided detection. ; Targeted ultrasound examination of the left breast was performed. COMPARISON:  Previous exam(s). ACR Breast Density Category c: The breasts are heterogeneously dense, which may obscure small masses. FINDINGS: No suspicious masses or calcifications are seen in the right breast. There is a spiculated mass corresponding to the area of palpable concern in the upper-outer left breast measuring 2.1 cm. Targeted ultrasound of the left breast was performed. There is an irregular hypoechoic mass in the left breast at 3 o'clock 6 cm from nipple measuring 1.4 x 1.6 x 2.1 cm. This corresponds well with the mass seen in the left breast at mammography. No lymphadenopathy seen in the left axilla. IMPRESSION: Suspicious palpable 2.1 cm mass in the left breast the 3 o'clock position. RECOMMENDATION: Recommend ultrasound-guided core biopsy of the mass in the left breast at the 3 o'clock position. I have discussed the findings and recommendations with the patient. If applicable, a reminder letter will be sent to the patient regarding the next appointment. BI-RADS CATEGORY  5: Highly suggestive of malignancy. Electronically Signed   By: Edwin Cap M.D.   On: 08/27/2023 15:58

## 2023-09-04 ENCOUNTER — Inpatient Hospital Stay: Payer: Medicare Other | Attending: Hematology | Admitting: Hematology

## 2023-09-04 ENCOUNTER — Other Ambulatory Visit: Payer: Medicare Other

## 2023-09-04 ENCOUNTER — Encounter: Payer: Self-pay | Admitting: Hematology

## 2023-09-04 VITALS — BP 140/75 | HR 102 | Temp 97.2°F | Resp 20 | Ht 63.39 in | Wt 124.4 lb

## 2023-09-04 DIAGNOSIS — Z86718 Personal history of other venous thrombosis and embolism: Secondary | ICD-10-CM | POA: Diagnosis not present

## 2023-09-04 DIAGNOSIS — Z8673 Personal history of transient ischemic attack (TIA), and cerebral infarction without residual deficits: Secondary | ICD-10-CM | POA: Diagnosis not present

## 2023-09-04 DIAGNOSIS — Z87891 Personal history of nicotine dependence: Secondary | ICD-10-CM | POA: Diagnosis not present

## 2023-09-04 DIAGNOSIS — Z823 Family history of stroke: Secondary | ICD-10-CM | POA: Insufficient documentation

## 2023-09-04 DIAGNOSIS — Z170 Estrogen receptor positive status [ER+]: Secondary | ICD-10-CM | POA: Diagnosis not present

## 2023-09-04 DIAGNOSIS — C50412 Malignant neoplasm of upper-outer quadrant of left female breast: Secondary | ICD-10-CM | POA: Insufficient documentation

## 2023-09-04 NOTE — Patient Instructions (Addendum)
[facility]  Discharge Instructions  You were seen and examined today by Dr. Ellin Saba. Dr. Ellin Saba is a medical oncologist, meaning that he specializes in the treatment of cancer diagnoses. Dr. Ellin Saba discussed your past medical history, family history of cancers, and the events that led to you being here today.  You were referred to Dr. Ellin Saba due to a new diagnosis of breast cancer.  You have been diagnosed with HER2+ breast cancer. This can be associated with a more aggressive cancer type; however, in your case the breast cancer appears to still be quite small and has not began spreading beyond the breast. We know this because there were no concerning findings on your ultrasound that would indicate spread of the cancer to the lymph nodes, we will confirm that there is no spread with a CT scan.  You are scheduled to see a surgeon on Tuesday, please proceed with this meeting and schedule surgery based on their recommendation.  Because of your cancer's HER2 positive feature, you will require treatment following surgery. In preparation of this, Dr. Ellin Saba has requested an echocardiogram (an ultrasound of your heart) to establish your cardiac function prior to treatments following surgery. Also following a lumpectomy, we will discuss radiation therapy.  Proceed with surgery as recommended by Dr. Henreitta Leber. We will see you 4 weeks after surgery.  Thank you for choosing [facility] to provide your oncology and hematology care.   To afford each patient quality time with our provider, please arrive at least 15 minutes before your scheduled appointment time. You may need to reschedule your appointment if you arrive late (10 or more minutes). Arriving late affects you and other patients whose appointments are after yours.  Also, if you miss three or more appointments without notifying the office, you may be dismissed from the clinic at  the provider's discretion.    Again, thank you for choosing [facility]  Our hope is that these requests will decrease the amount of time that you wait before being seen by our physicians.   If you have a lab appointment with the Cancer Center - please note that after April 8th, all labs will be drawn in the cancer center.  You do not have to check in or register with the main entrance as you have in the past but will complete your check-in at the cancer center.            _____________________________________________________________  Should you have questions after your visit to [facility], please contact our office at [phone] and follow the prompts.  Our office hours are 8:00 a.m. to 4:30 p.m. Monday - Thursday and 8:00 a.m. to 2:30 p.m. Friday.  Please note that voicemails left after 4:00 p.m. may not be returned until the following business day.  We are closed weekends and all major holidays.  You do have access to a nurse 24-7, just call the main number to the clinic [phone] and do not press any options, hold on the line and a nurse will answer the phone.    For prescription refill requests, have your pharmacy contact our office and allow 72 hours.    Masks are no longer required in the cancer centers. If you would like for your care team to wear a mask while they are taking care of you, please let them know. You may have one support person who is at least 70 years  old accompany you for your appointments.

## 2023-09-05 ENCOUNTER — Other Ambulatory Visit: Payer: Self-pay | Admitting: Family Medicine

## 2023-09-05 MED ORDER — ROSUVASTATIN CALCIUM 5 MG PO TABS: 5.0000 mg | ORAL_TABLET | Freq: Every day | ORAL | 3 refills | Status: DC

## 2023-09-06 ENCOUNTER — Ambulatory Visit (HOSPITAL_COMMUNITY)
Admission: RE | Admit: 2023-09-06 | Discharge: 2023-09-06 | Disposition: A | Discharge status: Home / Self Care | Payer: Medicare Other | Source: Ambulatory Visit | Attending: Hematology | Admitting: Hematology

## 2023-09-06 DIAGNOSIS — C50412 Malignant neoplasm of upper-outer quadrant of left female breast: Secondary | ICD-10-CM | POA: Insufficient documentation

## 2023-09-06 DIAGNOSIS — Z170 Estrogen receptor positive status [ER+]: Secondary | ICD-10-CM | POA: Diagnosis not present

## 2023-09-06 DIAGNOSIS — J432 Centrilobular emphysema: Secondary | ICD-10-CM | POA: Diagnosis not present

## 2023-09-06 DIAGNOSIS — I700 Atherosclerosis of aorta: Secondary | ICD-10-CM | POA: Diagnosis not present

## 2023-09-06 MED ORDER — IOHEXOL 300 MG/ML  SOLN
75.0000 mL | Freq: Once | INTRAMUSCULAR | Status: AC | PRN
  Administered 2023-09-06: 75 mL via INTRAVENOUS

## 2023-09-09 ENCOUNTER — Ambulatory Visit: Payer: Medicare Other | Admitting: "Endocrinology

## 2023-09-09 ENCOUNTER — Encounter: Payer: Self-pay | Admitting: "Endocrinology

## 2023-09-09 VITALS — BP 128/80 | HR 72 | Ht 63.0 in | Wt 125.6 lb

## 2023-09-09 DIAGNOSIS — E559 Vitamin D deficiency, unspecified: Secondary | ICD-10-CM

## 2023-09-09 DIAGNOSIS — M810 Age-related osteoporosis without current pathological fracture: Secondary | ICD-10-CM | POA: Diagnosis not present

## 2023-09-09 MED ORDER — OYSTER SHELL CALCIUM/D3 500-5 MG-MCG PO TABS: 1.0000 | ORAL_TABLET | Freq: Two times a day (BID) | ORAL | 1 refills | Status: AC

## 2023-09-09 NOTE — Progress Notes (Signed)
09/09/2023, 1:44 PM   Endocrinology follow-up note  Subjective:    Patient ID: Pamela Henson, female    DOB: 23-May-1951, PCP Pamela Sams, DO   Past Medical History:  Diagnosis Date   Anemia    Bipolar 1 disorder (HCC)    Bipolar 1 disorder (HCC)    Complication of anesthesia    Headache    History of chicken pox    History of measles    History of mumps    PONV (postoperative nausea and vomiting)    Urine retention    takes Flomax   Past Surgical History:  Procedure Laterality Date   ABDOMINAL HYSTERECTOMY  09/24/1977   age 72 due to vaginal bleeding   BREAST BIOPSY Left 08/30/2023   [place] LT BREAST BX W LOC DEV 1ST LESION IMG BX SPEC US GUIDE 08/30/2023 [facility]   COLONOSCOPY N/A 10/30/2017   Procedure: COLONOSCOPY;  Surgeon: Pamela Hippo, MD;  Location: [facility];  Service: Endoscopy;  Laterality: N/A;  1225   COLONOSCOPY WITH PROPOFOL N/A 10/13/2022   Procedure: COLONOSCOPY WITH PROPOFOL;  Surgeon: Pamela Frame, MD;  Location: [facility];  Service: Gastroenterology;  Laterality: N/A;   ESOPHAGOGASTRODUODENOSCOPY (EGD) WITH PROPOFOL N/A 10/13/2022   Procedure: ESOPHAGOGASTRODUODENOSCOPY (EGD) WITH PROPOFOL;  Surgeon: Pamela Frame, MD;  [facility]: [facility];  Service: Gastroenterology;  Laterality: N/A;   ORIF TIBIA PLATEAU Right 12/12/2022   Procedure: OPEN REDUCTION INTERNAL FIXATION (ORIF) TIBIAL PLATEAU;  Surgeon: Pamela Lofts, MD;  Location: [facility];  Service: Orthopedics;  Laterality: Right;   PERIPHERAL VASCULAR THROMBECTOMY Right 11/2022   2 DVT following surgical intervention of fracture   POLYPECTOMY  10/13/2022   Procedure: POLYPECTOMY;  Surgeon: Pamela Frame, MD;  Location: [facility];  Service: Gastroenterology;;   Social History   Socioeconomic History   Marital status: Widowed    Spouse name: Not on file   Number of children: 1    Years of education: Not on file   Highest education level: Some college, no degree  Occupational History   Occupation: Disablity    Comment: Due to back problems  Tobacco Use   Smoking status: Former    Current packs/day: 0.00    Average packs/day: 1 pack/day for 15.0 years (15.0 ttl pk-yrs)    Types: Cigarettes    Start date: 09/25/1983    Quit date: 09/24/1998    Years since quitting: 24.9   Smokeless tobacco: Never  Vaping Use   Vaping status: Never Used  Substance and Sexual Activity   Alcohol use: No    Alcohol/week: 0.0 standard drinks of alcohol   Drug use: No   Sexual activity: Not on file  Other Topics Concern   Not on file  Social History Narrative   Not on file   Social Drivers of Health   Financial Resource Strain: Low Risk  (08/16/2023)   Overall Financial Resource Strain (CARDIA)    Difficulty of Paying Living Expenses: Not hard at all  Food Insecurity: No Food Insecurity (08/16/2023)   Hunger Vital Sign    Worried About Running Out of Food in the Last Year: Never true    Ran Out of  Food in the Last Year: Never true  Transportation Needs: No Transportation Needs (08/16/2023)   PRAPARE - Administrator, Civil Service (Medical): No    Lack of Transportation (Non-Medical): No  Physical Activity: Inactive (08/16/2023)   Exercise Vital Sign    Days of Exercise per Week: 0 days    Minutes of Exercise per Session: 30 min  Stress: Stress Concern Present (08/16/2023)   [employer] of Occupational Health - Occupational Stress Questionnaire    Feeling of Stress : To some extent  Social Connections: Moderately Integrated (08/16/2023)   Social Connection and Isolation Panel [NHANES]    Frequency of Communication with Friends and Family: More than three times a week    Frequency of Social Gatherings with Friends and Family: More than three times a week    Attends Religious Services: 1 to 4 times per year    Active Member of [employer] or Organizations: No     Attends Engineer, structural: More than 4 times per year    Marital Status: Widowed   Family History  Problem Relation Age of Onset   Stroke Mother 11   Stroke Father 41   Colon cancer Neg Hx    Outpatient Encounter Medications as of 09/09/2023  Medication Sig   calcium-vitamin D (OSCAL WITH D) 500-5 MG-MCG tablet Take 1 tablet by mouth 2 (two) times daily with a meal.   rosuvastatin (CRESTOR) 5 MG tablet Take 1 tablet (5 mg total) by mouth daily.   acetaminophen (TYLENOL) 500 MG tablet Take 500 mg by mouth every 6 (six) hours as needed for mild pain, moderate pain or headache.   cholecalciferol (VITAMIN D3) 25 MCG (1000 UNIT) tablet Take 1,000 Units by mouth daily.   gabapentin (NEURONTIN) 100 MG capsule Take 100 mg by mouth 3 (three) times daily.   Multiple Vitamins-Minerals (MULTIVITAMIN WITH MINERALS) tablet Take 1 tablet by mouth daily.   sertraline (ZOLOFT) 50 MG tablet Take 50 mg by mouth daily. hs   tamsulosin (FLOMAX) 0.4 MG CAPS capsule Take 1 capsule (0.4 mg total) by mouth at bedtime.   No facility-administered encounter medications on file as of 09/09/2023.   ALLERGIES: Allergies  Allergen Reactions   Asa [Aspirin] Other (See Comments)    Had GI bleed 09/2022    VACCINATION STATUS: Immunization History  Administered Date(s) Administered   Fluad Trivalent(High Dose 65+) 08/17/2023   Moderna Covid-19 Fall Seasonal Vaccine 52yrs & older 08/17/2023   PNEUMOCOCCAL CONJUGATE-20 08/20/2023    HPI Pamela Henson is 72 y.o. female who presents today with a medical history as above. she is being seen in follow-up after she was seen in consultation for osteoporosis requested by Pamela Sams, DO.     Patient did have right lower extremity fracture in March 2024 due to a fall from stool she was standing on to fix something overhead.  It was reported as closed bicondylar fracture. Subsequently, she was offered screening bone density which she had on July 31, 2023 showing negative T-score of -3.1 on AP spine, -4.3 on right femur, and -4.0 on dual femur total mean. She was not previously diagnosed with osteoporosis and this was her first bone density.  She is a former smoker.  She denies any exposure to high-dose steroids.  She denies height loss.  Her previsit labs indicate normocalcemia, normal phosphorus and magnesium.  She does have mild hypercalciuria.   In the interval, she was diagnosed with breast cancer, currently being for  options of treatment.  She denies any thyroid/parathyroid dysfunctions. She is on regular vitamin D supplement. Her other medical problems include anemia, mood disorders, arthritis, and degenerative lumbosacral disc. -She does not follow any particular diet or exercise regimen.  She does not report recent weight change, reports to have been normal with slight weight with current BMI of 21. Her daily and dietary intake of calcium seems to be appropriate.   Review of Systems  Constitutional: +mildly fluctuating body weight , no fatigue, no subjective hyperthermia, no subjective hypothermia Eyes: no blurry vision, no xerophthalmia ENT: no sore throat, no nodules palpated in throat, no dysphagia/odynophagia, no hoarseness   Objective:       09/09/2023    1:06 PM 09/04/2023    8:03 AM 08/20/2023    3:58 PM  Vitals with BMI  Height 5\' 3"  5' 3.386" 5\' 5"   Weight 125 lbs 10 oz 124 lbs 6 oz 126 lbs 6 oz  BMI 22.25 21.77 21.03  Systolic 128 140 409  Diastolic 80 75 64  Pulse 72 102 80    BP 128/80   Pulse 72   Ht 5\' 3"  (1.6 m)   Wt 125 lb 9.6 oz (57 kg)   BMI 22.25 kg/m   Wt Readings from Last 3 Encounters:  09/09/23 125 lb 9.6 oz (57 kg)  09/04/23 124 lb 6.4 oz (56.4 kg)  08/20/23 126 lb 6.4 oz (57.3 kg)    Physical Exam  Constitutional:  Body mass index is 22.25 kg/m.,  not in acute distress, normal state of mind Eyes: PERRLA, EOMI, no exophthalmos ENT: moist mucous membranes, no gross thyromegaly,  no gross cervical lymphadenopathy Cardiovascular: normal precordial activity, Regular Rate and Rhythm, no Murmur/Rubs/Gallops   CMP ( most recent) CMP     Component Value Date/Time   NA 147 (H) 08/20/2023 1122   K 4.7 08/20/2023 1122   CL 107 (H) 08/20/2023 1122   CO2 26 08/20/2023 1122   GLUCOSE 99 08/20/2023 1122   GLUCOSE 110 (H) 12/13/2022 0222   BUN 12 08/20/2023 1122   CREATININE 0.73 08/20/2023 1122   CALCIUM 9.4 08/21/2023 0929   PROT 6.5 08/20/2023 1122   ALBUMIN 4.1 08/20/2023 1122   AST 23 08/20/2023 1122   ALT 7 08/20/2023 1122   ALKPHOS 111 08/20/2023 1122   BILITOT <0.2 08/20/2023 1122   EGFR 88 08/20/2023 1122   GFRNONAA >60 12/13/2022 0222    Lipid Panel     Component Value Date/Time   CHOL 189 08/20/2023 1122   TRIG 124 08/20/2023 1122   HDL 56 08/20/2023 1122   CHOLHDL 3.4 08/20/2023 1122   LDLCALC 111 (H) 08/20/2023 1122   LABVLDL 22 08/20/2023 1122     AP Spine L1-L3 (L2) 07/31/2023 70.5 Osteoporosis -3.1 0.789 g/cm2 - -DualFemur Total Right 07/31/2023 70.5 Osteoporosis -4.3 0.461 g/cm2   DualFemur Total Mean 07/31/2023 70.5 Osteoporosis -4.0 0.499 g/cm2 - - ASSESSMENT: The BMD measured at Femur Total Right is 0.461 g/cm2 with a T-score of -4.3. This patient is considered osteoporotic according to World Health Organization [facility]) criteria. The scan quality is good. L2 and L4 were excluded due to advanced degenerative changes.  Assessment & Plan:   Severe osteoporosis -spine/hip   2.  Hypocalcemia    3.  Vitamin D deficiency  - I have reviewed her available  and new  records and clinically evaluated the patient. - Based on these reviews, she has severe osteoporosis of the spine, femur, and hip. Her basic workup  for secondary osteoporosis indicate mild hypercalciuria without hypercalcemia.  She does not have a diagnosis of hyperparathyroidism.   In light of her interval diagnosis with breast cancer, she would benefit from early initiation of  anti-osteoporosis treatment. I discussed initiated Reclast infusion yearly.  She will be considered for repeat 24-hour urine calcium after her next visit.  She is encouraged to continue calcium and vitamin D combination supplement.  She will need repeat bone density in 2026. -she has high risk for fracture which was discussed with her.   - she is advised to maintain close follow up with Pamela Sams, DO for primary care needs.   I spent  26  minutes in the care of the patient today including review of labs from Thyroid Function, CMP, and other relevant labs ; imaging/biopsy records (current and previous including abstractions from other facilities); face-to-face time discussing  her lab results and symptoms, medications doses, her options of short and long term treatment based on the latest standards of care / guidelines;   and documenting the encounter.  Sallye Lat Dolata  participated in the discussions, expressed understanding, and voiced agreement with the above plans.  All questions were answered to her satisfaction. she is encouraged to contact clinic should she have any questions or concerns prior to her return visit.   Follow up plan: Return for Refer her for Reclast Infusion, F/U with Pre-visit Labs.   Marquis Lunch, MD [facility] [address] [place], [place] [address] Phone: [phone]  Fax: [phone]     09/09/2023, 1:44 PM  This note was partially dictated with voice recognition software. Similar sounding words can be transcribed inadequately or may not  be corrected upon review.

## 2023-09-10 ENCOUNTER — Ambulatory Visit: Payer: Medicare Other | Admitting: General Surgery

## 2023-09-10 ENCOUNTER — Other Ambulatory Visit (HOSPITAL_COMMUNITY): Payer: Medicare Other

## 2023-09-10 VITALS — BP 129/87 | HR 101 | Temp 98.0°F | Resp 14 | Ht 63.0 in | Wt 124.0 lb

## 2023-09-10 DIAGNOSIS — C50412 Malignant neoplasm of upper-outer quadrant of left female breast: Secondary | ICD-10-CM

## 2023-09-10 DIAGNOSIS — Z170 Estrogen receptor positive status [ER+]: Secondary | ICD-10-CM | POA: Diagnosis not present

## 2023-09-10 NOTE — Patient Instructions (Signed)
Surgical Options for Early-Stage Breast Cancer Surgery is usually the first treatment for early-stage breast cancer. There are two surgery options that result in good rates of cancer survival. They include: Partial mastectomy. Mastectomy. Breast cancer is different for everyone, even in its early stage. The best treatment for one person might not be the best treatment for another. Learn as much as you can about your cancer, and work closely with your health care providers to make the choice that produces the best results for you. What is partial mastectomy? Partial mastectomy, also called breast-sparing surgery or breast-conserving surgery, is surgery to remove the cancer along with some normal breast tissue that surrounds it. Lymph nodes from under the arm may also be removed and tested to find out if the cancer has spread. If cancer is located near the chest wall, part of the chest wall lining may also be removed. What is a mastectomy? A mastectomy is surgery to remove the cancer along with the entire breast tissue. There are several types of mastectomy: Simple or total mastectomy. In this surgery, the entire breast is removed, including breast tissue, nipple, areola, and skin around the breast. Some lymph nodes may also be removed from under the arm. If cancer is located near the chest wall, part of the chest wall lining may also be removed. Skin-sparing mastectomy. In this surgery, the breast tissue, nipple, and areola are removed, and most of the skin over the breast is left in place. This surgery results in less scar tissue than other mastectomy surgeries, which allows for a more natural breast reconstruction. Nipple-sparing mastectomy. In this surgery, breast tissue is removed, but the skin and nipple are left in place. The tissue under the nipple and areola may be removed if cancer is found in the area. This may be an option for those who choose to have breast reconstruction after  mastectomy. Modified radical mastectomy. This surgery is the same as a simple mastectomy but also includes removing lymph nodes from under the arm (axillary lymph node dissection). Radical mastectomy. In this surgery, the entire breast, the lymph nodes under the arm, and the chest wall muscles under the breast are removed. This surgery is rarely done now. A modified radical mastectomy is preferred because it is just as effective but with the added advantage of fewer complications. What are some advantages and disadvantages of these surgeries? Partial mastectomy Advantages Keeping most of your breast tissue intact, allowing for a more natural look to the breast. Easier recovery when compared to a mastectomy. Ability to go home on the day of the procedure. Disadvantages Slightly higher risk that your cancer will come back. Needing more surgery at a later time. Requiring radiation therapy after surgery, which has side effects and possible complications. This is done to reduce the chances of breast cancer returning. Mastectomy Advantages Not needing to have radiation therapy or other treatments after surgery. Lower chances of your cancer coming back. Disadvantages Longer recovery time compared to partial mastectomy. Possibility of more complications. Requiring additional surgeries to reconstruct your breast. Questions to ask Here are some questions to ask about each surgery: What will my recovery be like? How will my breast look and feel? What are the possible risks and complications of the surgery? What additional treatment might I need after surgery? What are the risks and complications of radiation therapy? What are the risks and complications of chemotherapy? Will I be able to have breast reconstruction? Where to find more information National Cancer Institute: cancer.gov  American Cancer Society: cancer.org Summary Surgery is usually the first treatment for early-stage breast  cancer. Two surgery options are available. One surgery option is called partial mastectomy and the other is called mastectomy. Both result in good rates of cancer survival. Each option has advantages and disadvantages to consider. The best treatment for one person might not be the best treatment for you. Learn as much as you can about your cancer, and work closely with your health care providers to make the choice that produces the best results for you. This information is not intended to replace advice given to you by your health care provider. Make sure you discuss any questions you have with your health care provider. Document Revised: 08/23/2021 Document Reviewed: 08/23/2021 Elsevier Patient Education  2024 [employer].

## 2023-09-10 NOTE — Progress Notes (Unsigned)
[facility] History and Physical  Reason for Referral:*** Referring Physician: ***  Chief Complaint   New Patient (Initial Visit)     Pamela Henson is a 72 y.o. female.  HPI: ***.  The *** started *** and has had a duration of ***.  It is associated with ***.  The *** is improved with ***, and is made worse with ***.    Quality*** Context***  Past Medical History:  Diagnosis Date   Anemia    Bipolar 1 disorder (HCC)    Bipolar 1 disorder (HCC)    Complication of anesthesia    Headache    History of chicken pox    History of measles    History of mumps    PONV (postoperative nausea and vomiting)    Urine retention    takes Flomax    Past Surgical History:  Procedure Laterality Date   ABDOMINAL HYSTERECTOMY  09/24/1977   age 89 due to vaginal bleeding   BREAST BIOPSY Left 08/30/2023   [place] LT BREAST BX W LOC DEV 1ST LESION IMG BX SPEC US GUIDE 08/30/2023 [facility]   COLONOSCOPY N/A 10/30/2017   Procedure: COLONOSCOPY;  Surgeon: Malissa Hippo, MD;  [facility];  Service: Endoscopy;  Laterality: N/A;  1225   COLONOSCOPY WITH PROPOFOL N/A 10/13/2022   Procedure: COLONOSCOPY WITH PROPOFOL;  Surgeon: Dolores Frame, MD;  Location[facility];  Service: Gastroenterology;  Laterality: N/A;   ESOPHAGOGASTRODUODENOSCOPY (EGD) WITH PROPOFOL N/A 10/13/2022   Procedure: ESOPHAGOGASTRODUODENOSCOPY (EGD) WITH PROPOFOL;  Surgeon: Dolores Frame, MD;  [facility];  Service: Gastroenterology;  Laterality: N/A;   ORIF TIBIA PLATEAU Right 12/12/2022   Procedure: OPEN REDUCTION INTERNAL FIXATION (ORIF) TIBIAL PLATEAU;  Surgeon: Roby Lofts, MD;  Location: [facility] OR;  Service: Orthopedics;  Laterality: Right;   PERIPHERAL VASCULAR THROMBECTOMY Right 11/2022   2 DVT following surgical intervention of fracture   POLYPECTOMY  10/13/2022   Procedure: POLYPECTOMY;  Surgeon: Marguerita Merles, Reuel Boom, MD;  Location: [facility];  Service: Gastroenterology;;    Family History  Problem Relation Age of Onset   Stroke Mother 33   Stroke Father 67   Colon cancer Neg Hx     Social History   Tobacco Use   Smoking status: Former    Current packs/day: 0.00    Average packs/day: 1 pack/day for 15.0 years (15.0 ttl pk-yrs)    Types: Cigarettes    Start date: 09/25/1983    Quit date: 09/24/1998    Years since quitting: 24.9   Smokeless tobacco: Never  Vaping Use   Vaping status: Never Used  Substance Use Topics   Alcohol use: No    Alcohol/week: 0.0 standard drinks of alcohol   Drug use: No    Medications: {medication reviewed/display:3041432} Allergies as of 09/10/2023       Reactions   Asa [aspirin] Other (See Comments)   Had GI bleed 09/2022        Medication List        Accurate as of September 10, 2023  9:16 AM. If you have any questions, ask your nurse or doctor.          acetaminophen 500 MG tablet Commonly known as: TYLENOL Take 500 mg by mouth every 6 (six) hours as needed for mild pain, moderate pain or headache.   calcium-vitamin D 500-5 MG-MCG tablet Commonly known as: OSCAL WITH D Take 1 tablet by mouth 2 (two) times daily  with a meal.   cholecalciferol 25 MCG (1000 UNIT) tablet Commonly known as: VITAMIN D3 Take 1,000 Units by mouth daily.   gabapentin 100 MG capsule Commonly known as: NEURONTIN Take 100 mg by mouth 3 (three) times daily.   multivitamin with minerals tablet Take 1 tablet by mouth daily.   rosuvastatin 5 MG tablet Commonly known as: Crestor Take 1 tablet (5 mg total) by mouth daily.   sertraline 50 MG tablet Commonly known as: ZOLOFT Take 50 mg by mouth daily. hs   tamsulosin 0.4 MG Caps capsule Commonly known as: FLOMAX Take 1 capsule (0.4 mg total) by mouth at bedtime.         ROS:  {Review of Systems:30496}  Blood pressure 129/87, pulse (!) 101, temperature 98 F (36.7 C), temperature source Oral, resp. rate 14, height 5\' 3"   (1.6 m), weight 124 lb (56.2 kg), SpO2 91%. Physical Exam  Results: ADDENDUM REPORT: 09/02/2023 15:23   ADDENDUM: Pathology revealed GRADE II INVASIVE DUCTAL CARCINOMA, DUCTAL CARCINOMA IN SITU, SOLID AND CRIBRIFORM, INTERMEDIATE NUCLEAR GRADE, WITHOUT NECROSIS of the LEFT breast, outer, 3 o'clock, (heart clip). This was found to be concordant by Dr. Laveda Abbe.   Pathology results were discussed with the patient by telephone. The patient reported doing well after the biopsy with tenderness and mild swelling at the site. Post biopsy instructions and care were reviewed and questions were answered. The patient was encouraged to call [facility] for any additional concerns. My direct phone number was provided.   Pathology results were sent to Dr. Everlene Other, with [facility], via secure EPIC message on September 02, 2023. Dr. Adriana Simas will arrange referrals in [place], [place], per patient request.   Pathology results reported by Rene Kocher, RN on 09/02/2023.     Electronically Signed   By: Harmon Pier M.D.   On: 09/02/2023 15:23  CLINICAL DATA:  71 year old female presents for tissue sampling of 2.1 cm OUTER LEFT breast mass.   EXAM: ULTRASOUND GUIDED LEFT BREAST CORE NEEDLE BIOPSY   COMPARISON:  Previous exam(s).   PROCEDURE: I met with the patient and we discussed the procedure of ultrasound-guided biopsy, including benefits and alternatives. We discussed the high likelihood of a successful procedure. We discussed the risks of the procedure, including infection, bleeding, tissue injury, clip migration, and inadequate sampling. Informed written consent was given. The usual time-out protocol was performed immediately prior to the procedure.   Using sterile technique and 1% Lidocaine as local anesthetic, under direct ultrasound visualization, a 12 gauge spring-loaded device was used to perform biopsy of the 2.1 cm mass at the  3 o'clock position of the LEFT breast using a MEDIAL approach. At the conclusion of the procedure a HEART shaped tissue marker clip was deployed into the biopsy cavity. Follow up 2 view mammogram was performed and dictated separately.   IMPRESSION: Ultrasound guided biopsy of 2.1 cm OUTER LEFT breast mass. No apparent complications.   Electronically Signed: By: Harmon Pier M.D. On: 08/30/2023 08:07  CLINICAL DATA:  72 year old female with a palpable area of concern in the left breast.   EXAM: DIGITAL DIAGNOSTIC BILATERAL MAMMOGRAM WITH TOMOSYNTHESIS AND CAD; ULTRASOUND LEFT BREAST LIMITED   TECHNIQUE: Bilateral digital diagnostic mammography and breast tomosynthesis was performed. The images were evaluated with computer-aided detection. ; Targeted ultrasound examination of the left breast was performed.   COMPARISON:  Previous exam(s).   ACR Breast Density Category c: The breasts are heterogeneously dense,  which may obscure small masses.   FINDINGS: No suspicious masses or calcifications are seen in the right breast. There is a spiculated mass corresponding to the area of palpable concern in the upper-outer left breast measuring 2.1 cm.   Targeted ultrasound of the left breast was performed. There is an irregular hypoechoic mass in the left breast at 3 o'clock 6 cm from nipple measuring 1.4 x 1.6 x 2.1 cm. This corresponds well with the mass seen in the left breast at mammography. No lymphadenopathy seen in the left axilla.   IMPRESSION: Suspicious palpable 2.1 cm mass in the left breast the 3 o'clock position.   RECOMMENDATION: Recommend ultrasound-guided core biopsy of the mass in the left breast at the 3 o'clock position.   I have discussed the findings and recommendations with the patient. If applicable, a reminder letter will be sent to the patient regarding the next appointment.   BI-RADS CATEGORY  5: Highly suggestive of malignancy.     Electronically  Signed   By: Edwin Cap M.D.   On: 08/27/2023 15:58  Assessment & Plan:  Syerra Biskner is a 72 y.o. female with *** -*** -*** -Follow up ***  Future Appointments  Date Time Provider Department Center  09/12/2023  2:30 PM Lucretia Roers, MD [facility] None  09/27/2023  9:30 AM [facility] - ECHO 1 OUTPATIENT [facility] [place] [facility]  10/22/2023 11:45 AM Doreatha Massed, MD [facility] None  02/19/2024 11:00 AM Tommie Sams, DO [facility]  03/09/2024  1:30 PM Nida, Denman George, MD [facility] None  05/15/2024 10:30 AM [facility]-ANNUAL WELLNESS VISIT [facility]    All questions were answered to the satisfaction of the patient and family***.  The risk and benefits of *** were discussed including but not limited to ***.  After careful consideration, Aiva Wallenstein has decided to ***.    Lucretia Roers 09/10/2023, 9:16 AM

## 2023-09-11 ENCOUNTER — Inpatient Hospital Stay: Payer: Medicare Other | Admitting: Hematology

## 2023-09-12 ENCOUNTER — Ambulatory Visit: Payer: Medicare Other | Admitting: General Surgery

## 2023-09-12 VITALS — BP 118/76 | HR 73 | Temp 97.9°F | Resp 14 | Ht 63.0 in | Wt 125.0 lb

## 2023-09-12 DIAGNOSIS — C50412 Malignant neoplasm of upper-outer quadrant of left female breast: Secondary | ICD-10-CM

## 2023-09-12 DIAGNOSIS — Z170 Estrogen receptor positive status [ER+]: Secondary | ICD-10-CM

## 2023-09-12 NOTE — Patient Instructions (Addendum)
Lumpectomy  A lumpectomy, sometimes called a partial mastectomy, is surgery to remove a cancerous tumor or mass (the lump) from a breast. It is a form of breast-conserving or breast-preservation surgery. This means that the cancerous tissue is removed but the breast remains intact. During a lumpectomy, the portion of the breast that contains the tumor is removed. Lymph nodes under your arm may also be removed. Lymph nodes are part of the body's disease-fighting system (immune system) and are usually the first place where breast cancer spreads. Tell a health care provider about: Any allergies you have. All medicines you are taking, including vitamins, herbs, eye drops, creams, and over-the-counter medicines. Any problems you or family members have had with anesthetic medicines. Any bleeding problems you have. Any surgeries you have had. Any medical conditions you have. Whether you are pregnant or may be pregnant. What are the risks? Generally, this is a safe procedure. However, problems may occur, including: Bleeding. Infection. Allergic reaction to medicines. Pain, swelling, weakness, or numbness in the arm on the side of your surgery. Temporary swelling. Change in the shape of the breast, especially if a large portion is removed. Scar tissue that forms at the surgical site and feels hard to the touch. Blood clots. What happens before the procedure? Medicines Ask your health care provider about: Changing or stopping your regular medicines. These include any diabetes medicines or blood thinners you take. Taking medicines such as aspirin and ibuprofen. These medicines can thin your blood. Do not take them unless your health care provider tells you to. Taking over-the-counter medicines, vitamins, herbs, and supplements. Surgery safety Ask your health care provider how your surgery site will be marked. A procedure may be done to locate and mark the tumor area in the breast (localization).  This will guide the surgeon to where the incision will be made. This may be done with: Imaging, such as a mammogram, ultrasound, or MRI. Insertion of a small wire, clip, or seed, or an implant that will reflect a radar signal. Also, ask what steps will be taken to help prevent infection. These may include: Washing skin with a germ-killing soap. Taking antibiotic medicine. General instructions You may have screening tests or exams to get normal measurements of your arm, also called baseline measurements. These can be compared to measurements done after surgery to monitor for swelling (lymphedema) that can develop after having lymph nodes removed. If you will be going home right after the procedure, plan to have a responsible adult: Take you home from the hospital or clinic. You will not be allowed to drive. Care for you for the time you are told. What happens during the procedure?  An IV will be inserted into one of your veins. You may be given: A sedative. This helps you relax. Anesthesia. This will: Numb certain areas of your body. Make you fall asleep for surgery. An electric scalpel will be used to reduce bleeding (electrocautery knife). A curved incision that follows the natural curve of your breast will be made. The tumor will be removed along with some of the tissue around it. This will be sent to the lab for testing. Your health care provider may also remove lymph nodes at this time if needed. If the tumor is close to the muscles over your chest, some muscle tissue may also be removed. A small drain tube may be inserted into your breast area or armpit to collect fluid that may build up after surgery. This tube will be connected to  a suction bulb on the outside of your body to remove the fluid. The incision will be closed with stitches (sutures). A bandage (dressing) may be placed over the incision. The procedure may vary among health care providers and hospitals. What happens after  the procedure? Your blood pressure, heart rate, breathing rate, and blood oxygen level will be monitored until you leave the hospital or clinic. You will be given medicine for pain as needed. You will be encouraged to get up and walk as soon as you can. This will improve blood flow and breathing. Ask for help if you feel weak or unsteady. You may have a drain tube in place for 2-3 days to prevent a collection of blood (hematoma) from developing in the breast. You may have a pressure bandage applied for 1-2 days to prevent bleeding or swelling. Ask your health care provider how to care for your bandage at home. You may be given a tight sleeve to wear over your arm on the side of your surgery. Wear the sleeve as told by your health care provider. Do not drive or operate machinery until your health care provider says that it is safe. Where to find more information American Cancer Society: cancer.org National Cancer Institute: cancer.gov Summary A lumpectomy, sometimes called a partial mastectomy, is surgery to remove a cancerous tumor or mass (the lump) from a breast. During a lumpectomy, the portion of the breast that contains the tumor is removed. Plan to have someone take you home from the hospital or clinic. You may have a drain tube in place for 2-3 days to prevent a collection of blood (hematoma) from developing in the breast. This information is not intended to replace advice given to you by your health care provider. Make sure you discuss any questions you have with your health care provider. Sentinel Lymph Node Biopsy  A sentinel lymph node biopsy is a procedure to identify, remove, and check a lymph node for cancer. Lymph is excess fluid from the tissues in your body. It is removed through the lymphatic system. This system is also a part of your body's defense system (immune system). The system includes lymph nodes and lymph vessels. Certain types of cancer can spread to nearby lymph nodes  through the lymphatic system. The cancer usually spreads to one lymph node first, and then to others. The first lymph node that the cancer could spread to is called the sentinel lymph node. In some cases, there may be more than one sentinel lymph node. You may have this procedure to determine whether your cancer has spread and to help your health care provider plan your treatment. If no cancer is found in the sentinel lymph node, it is very unlikely that the cancer has spread to any of your other lymph nodes. If cancer is found in the sentinel lymph node, additional lymph nodes may be removed for testing. Tell a health care provider about: Any allergies you have, including any history of problems with contrast dye. All medicines you are taking, including vitamins, herbs, eye drops, creams, and over-the-counter medicines. Any problems you or family members have had with anesthetic medicines. Any blood disorders you have. Any surgeries you have had. Any medical conditions you have. Whether you are pregnant or may be pregnant. What are the risks? Generally, this is a safe procedure. However, problems may occur, including: Infection. Bleeding. Allergic reaction to medicines or dyes. Staining of the skin where dye is injected. Damaged lymph vessels, causing a buildup of  fluid (lymphedema). Pain or bruising at the biopsy site. What happens before the procedure?  Medicines Ask your health care provider about: Changing or stopping your regular medicines. This is especially important if you are taking diabetes medicines or blood thinners. Taking medicines such as aspirin and ibuprofen. These medicines can thin your blood. Do not take these medicines before your procedure unless your health care provider instructs you to do so. Taking over-the-counter medicines, vitamins, herbs, and supplements. Surgery safety Ask your health care provider: How your surgery site will be marked. What steps will be  taken to help prevent infection. These steps may include: Removing hair at the surgery site. Washing skin with a germ-killing soap. Receiving antibiotic medicine. General instructions Do not use any products that contain nicotine or tobacco for at least 2 weeks before the procedure. These products include cigarettes, chewing tobacco, and vaping devices, such as e-cigarettes. If you need help quitting, ask your health care provider. You may have blood tests to make sure your blood clots normally. Plan to have a responsible adult take you home from the hospital or clinic. What happens during the procedure?  An IV will be inserted into one of your veins. You will be given one or more of the following: A medicine to help you relax (sedative). A medicine to numb the area (local anesthetic). A medicine to make you fall asleep (general anesthetic). A dye, a radioactive substance, or both will be injected around the tumor. Both the dye and the radioactive substance will follow the same path that a spreading cancer would likely follow. The dye will reach your lymph nodes quickly. It may be given just before surgery. The radioactive substance will take longer to reach your lymph nodes. It may be given 2-24 hours before surgery, depending on your hospital. If a radioactive substance was injected, a scanner will show where the substance has spread. This will help identify the sentinel lymph node. The surgeon will make a small incision. If  dye was injected, your surgeon will look for any lymph nodes that have picked up the dye. Sentinel lymph nodes will be removed and sent to a lab for testing. If no cancer is found, no other lymph nodes will be removed. It is unlikely the cancer has spread. If cancer is found, the surgeon will remove other lymph nodes for testing. This may happen during the same procedure or at a later time. The incision will be closed with stitches (sutures) or skin glue. A small  dressing may be taped over the incision area. The procedure may vary among health care providers and hospitals. What happens after the procedure? Your blood pressure, heart rate, breathing rate, and blood oxygen level will be monitored until you leave the hospital or clinic. Your urine or stool may be blue for the next 24-48 hours. This is normal. It is caused by the dye that is used during the procedure. If you were given a sedative during the procedure, it can affect you for several hours. Do not drive or operate machinery until your health care provider says that it is safe. It is up to you to get the results of your procedure. Ask your health care provider, or the department that is doing the procedure, when your results will be ready. Summary A sentinel lymph node biopsy is a procedure to identify, remove, and examine one or more lymph nodes for cancer. If you have cancer, you may have this procedure to determine whether your cancer has  spread. If no cancer is found in the sentinel lymph node, it is very unlikely that the cancer has spread to any other lymph nodes. If cancer is found in the sentinel lymph node, your surgeon may remove additional lymph nodes for testing. This information is not intended to replace advice given to you by your health care provider. Make sure you discuss any questions you have with your health care provider. Document Revised: 06/21/2020 Document Reviewed: 06/23/2020 Elsevier Patient Education  2024 [employer].

## 2023-09-13 NOTE — Progress Notes (Signed)
[facility]  Discussed option further with patient. She wants to pursue a partial mastectomy and SLNB. She is aware of the risk of needing additional surgery if margins positive and of radiation treatment. Discussed risk of bleeding, infection, needing more treatment. All questions have been answered. This lesions is palpable so no need for a radiofrequency tag to be placed.  BP 118/76   Pulse 73   Temp 97.9 F (36.6 C) (Oral)   Resp 14   Ht 5\' 3"  (1.6 m)   Wt 125 lb (56.7 kg)   SpO2 90%   BMI 22.14 kg/m   Plan for partial mastectomy, sentinel lymph node biopsy after magtrace injection intraoperatively.   Algis Greenhouse, MD [facility] [address] [place], [place] [address] [phone] (office)

## 2023-09-13 NOTE — H&P (Signed)
[facility] History and Physical   Reason for Referral: Left breast cancer  Referring Physician: Tommie Sams, DO     Chief Complaint   New Patient (Initial Visit)        Pamela Henson is a 72 y.o. female.  HPI: Pamela Henson is a 72 yo with a new diagnosis of left breast cancer. She has a history of DVT in the past after an orthopedic procedure but is no longer on Eliquis due to side effects. She reported feeling a lump in her left breast in October and was found to have a 2.1cm mass in the breast on Mammogram. 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 She has seen Dr. Ellin Saba and discussed option of mastectomy versus lumpectomy and SLNB. She is ER and HER 2 + but PR negative.    She is here to discuss her options for surgery. She is accompanied by her cousin.  She has no history of any family breast cancer.           Past Medical History:  Diagnosis Date   Anemia     Bipolar 1 disorder (HCC)     Bipolar 1 disorder (HCC)     Complication of anesthesia     Headache     History of chicken pox     History of measles     History of mumps     PONV (postoperative nausea and vomiting)     Urine retention      takes Flomax               Past Surgical History:  Procedure Laterality Date   ABDOMINAL HYSTERECTOMY   09/24/1977    age 44 due to vaginal bleeding   BREAST BIOPSY Left 08/30/2023    [place] LT BREAST BX W LOC DEV 1ST LESION IMG BX SPEC US GUIDE 08/30/2023 [facility]   COLONOSCOPY N/A 10/30/2017    Procedure: COLONOSCOPY;  Surgeon: Malissa Hippo, MD;  [facility];  Service: Endoscopy;  Laterality: N/A;  1225   COLONOSCOPY WITH PROPOFOL N/A 10/13/2022    Procedure: COLONOSCOPY WITH PROPOFOL;  Surgeon: Dolores Frame, MD;  Location: [facility];  Service: Gastroenterology;  Laterality: N/A;   ESOPHAGOGASTRODUODENOSCOPY (EGD) WITH PROPOFOL N/A 10/13/2022    Procedure: ESOPHAGOGASTRODUODENOSCOPY (EGD)  WITH PROPOFOL;  Surgeon: Dolores Frame, MD;  [facility];  Service: Gastroenterology;  Laterality: N/A;   ORIF TIBIA PLATEAU Right 12/12/2022    Procedure: OPEN REDUCTION INTERNAL FIXATION (ORIF) TIBIAL PLATEAU;  Surgeon: Roby Lofts, MD;  Location: [facility] OR;  Service: Orthopedics;  Laterality: Right;   PERIPHERAL VASCULAR THROMBECTOMY Right 11/2022    2 DVT following surgical intervention of fracture   POLYPECTOMY   10/13/2022    Procedure: POLYPECTOMY;  Surgeon: Dolores Frame, MD;  [facility]: [facility];  Service: Gastroenterology;;               Family History  Problem Relation Age of Onset   Stroke Mother 48   Stroke Father 62   Colon cancer Neg Hx            Social History  Social History         Tobacco Use   Smoking status: Former      Current packs/day: 0.00      Average packs/day: 1 pack/day for 15.0 years (15.0 ttl pk-yrs)      Types: Cigarettes  Start date: 09/25/1983      Quit date: 09/24/1998      Years since quitting: 24.9   Smokeless tobacco: Never  Vaping Use   Vaping status: Never Used  Substance Use Topics   Alcohol use: No      Alcohol/week: 0.0 standard drinks of alcohol   Drug use: No        Medications: I have reviewed the patient's current medications. Allergies as of 09/10/2023         Reactions    Asa [aspirin] Other (See Comments)    Had GI bleed 09/2022            Medication List           Accurate as of September 10, 2023  9:16 AM. If you have any questions, ask your nurse or doctor.              acetaminophen 500 MG tablet Commonly known as: TYLENOL Take 500 mg by mouth every 6 (six) hours as needed for mild pain, moderate pain or headache.    calcium-vitamin D 500-5 MG-MCG tablet Commonly known as: OSCAL WITH D Take 1 tablet by mouth 2 (two) times daily with a meal.    cholecalciferol 25 MCG (1000 UNIT) tablet Commonly known as: VITAMIN D3 Take 1,000 Units by mouth daily.     gabapentin 100 MG capsule Commonly known as: NEURONTIN Take 100 mg by mouth 3 (three) times daily.    multivitamin with minerals tablet Take 1 tablet by mouth daily.    rosuvastatin 5 MG tablet Commonly known as: Crestor Take 1 tablet (5 mg total) by mouth daily.    sertraline 50 MG tablet Commonly known as: ZOLOFT Take 50 mg by mouth daily. hs    tamsulosin 0.4 MG Caps capsule Commonly known as: FLOMAX Take 1 capsule (0.4 mg total) by mouth at bedtime.               ROS:  A comprehensive review of systems was negative except for: Integument/breast: positive for left breast mass   Blood pressure 129/87, pulse (!) 101, temperature 98 F (36.7 C), temperature source Oral, resp. rate 14, height 5\' 3"  (1.6 m), weight 124 lb (56.2 kg), SpO2 91%. Physical Exam Vitals reviewed.  HENT:     Head: Normocephalic.     Nose: Nose normal.  Eyes:     Extraocular Movements: Extraocular movements intact.  Cardiovascular:     Rate and Rhythm: Normal rate and regular rhythm.  Pulmonary:     Effort: Pulmonary effort is normal.     Breath sounds: Normal breath sounds.  Chest:  Breasts:    Right: No inverted nipple, mass, nipple discharge or tenderness.     Left: Mass and tenderness present. No inverted nipple or nipple discharge.     Comments: Left lateral breast mass noted Abdominal:     General: There is no distension.     Palpations: Abdomen is soft.     Tenderness: There is no abdominal tenderness.  Musculoskeletal:        General: Normal range of motion.     Cervical back: Normal range of motion.  Lymphadenopathy:     Upper Body:     Right upper body: No supraclavicular or axillary adenopathy.     Left upper body: No supraclavicular or axillary adenopathy.  Skin:    General: Skin is warm.  Neurological:     General: No focal deficit present.     Mental Status:  She is alert.  Psychiatric:        Mood and Affect: Mood normal.        Thought Content: Thought content  normal.        Judgment: Judgment normal.        Results: ADDENDUM REPORT: 09/02/2023 15:23   ADDENDUM: Pathology revealed GRADE II INVASIVE DUCTAL CARCINOMA, DUCTAL CARCINOMA IN SITU, SOLID AND CRIBRIFORM, INTERMEDIATE NUCLEAR GRADE, WITHOUT NECROSIS of the LEFT breast, outer, 3 o'clock, (heart clip). This was found to be concordant by Dr. Laveda Abbe.   Pathology results were discussed with the patient by telephone. The patient reported doing well after the biopsy with tenderness and mild swelling at the site. Post biopsy instructions and care were reviewed and questions were answered. The patient was encouraged to call [facility] for any additional concerns. My direct phone number was provided.   Pathology results were sent to Dr. Everlene Other, with [facility], via secure EPIC message on September 02, 2023. Dr. Adriana Simas will arrange referrals in [place], [place], per patient request.   Pathology results reported by Rene Kocher, RN on 09/02/2023.     Electronically Signed   By: Harmon Pier M.D.   On: 09/02/2023 15:23   CLINICAL DATA:  72 year old female presents for tissue sampling of 2.1 cm OUTER LEFT breast mass.   EXAM: ULTRASOUND GUIDED LEFT BREAST CORE NEEDLE BIOPSY   COMPARISON:  Previous exam(s).   PROCEDURE: I met with the patient and we discussed the procedure of ultrasound-guided biopsy, including benefits and alternatives. We discussed the high likelihood of a successful procedure. We discussed the risks of the procedure, including infection, bleeding, tissue injury, clip migration, and inadequate sampling. Informed written consent was given. The usual time-out protocol was performed immediately prior to the procedure.   Using sterile technique and 1% Lidocaine as local anesthetic, under direct ultrasound visualization, a 12 gauge spring-loaded device was used to perform biopsy of the 2.1 cm mass at the 3  o'clock position of the LEFT breast using a MEDIAL approach. At the conclusion of the procedure a HEART shaped tissue marker clip was deployed into the biopsy cavity. Follow up 2 view mammogram was performed and dictated separately.   IMPRESSION: Ultrasound guided biopsy of 2.1 cm OUTER LEFT breast mass. No apparent complications.   Electronically Signed: By: Harmon Pier M.D. On: 08/30/2023 08:07   CLINICAL DATA:  72 year old female with a palpable area of concern in the left breast.   EXAM: DIGITAL DIAGNOSTIC BILATERAL MAMMOGRAM WITH TOMOSYNTHESIS AND CAD; ULTRASOUND LEFT BREAST LIMITED   TECHNIQUE: Bilateral digital diagnostic mammography and breast tomosynthesis was performed. The images were evaluated with computer-aided detection. ; Targeted ultrasound examination of the left breast was performed.   COMPARISON:  Previous exam(s).   ACR Breast Density Category c: The breasts are heterogeneously dense, which may obscure small masses.   FINDINGS: No suspicious masses or calcifications are seen in the right breast. There is a spiculated mass corresponding to the area of palpable concern in the upper-outer left breast measuring 2.1 cm.   Targeted ultrasound of the left breast was performed. There is an irregular hypoechoic mass in the left breast at 3 o'clock 6 cm from nipple measuring 1.4 x 1.6 x 2.1 cm. This corresponds well with the mass seen in the left breast at mammography. No lymphadenopathy seen in the left axilla.   IMPRESSION: Suspicious palpable 2.1 cm mass in the left breast the 3 o'clock  position.   RECOMMENDATION: Recommend ultrasound-guided core biopsy of the mass in the left breast at the 3 o'clock position.   I have discussed the findings and recommendations with the patient. If applicable, a reminder letter will be sent to the patient regarding the next appointment.   BI-RADS CATEGORY  5: Highly suggestive of malignancy.     Electronically  Signed   By: Edwin Cap M.D.   On: 08/27/2023 15:58   Assessment & Plan:  Anisa Mcloud is a 72 y.o. female with a left upper outer breast mass. This is palpable. She has smaller breast. Dr. Ellin Saba is recommending SLNB for her in addition to removal of the mass. I am afraid a lumpectomy could result in poor cosmetic results on the left lateral breast.    We have discussed the options for surgery including the option of mastectomy with sentinel node biopsy versus partial mastectomy (lumpectomy) with sentinel node biopsy. We have discussed that there is no difference in the prognosis or chance or recurrence or differences in survival between the two options. We have discussed the need for radiation with the lumpectomy, and we have discussed that she will be referred to oncology after our procedure to further discuss her options for chemotherapy and hormonal therapy if she qualifies.    We have discussed that if she decides to have a lumpectomy that we will need to get a needle placed into the area where the biopsy was performed, since we cannot palpate a mass. We have also discussed the need for injection of radiotracer and blue dye to perform the sentinel node biopsy.   We have discussed that the sentinel node biopsy tells [place] if the cancer has spread to the lymph nodes, and can help with plans for chemotherapy treatment and overall prognosis.     We have discussed that if the lumpectomy does not remove the entire cancer that she may have to have an additional procedure, and we have discussed that a positive sentinel node can require further removal of lymph nodes from the axilla but that recent research does not show any improvement in disease free survival and carries greater risk for lymphedema.     We have discussed that these are big discussions, and that the risk from the operations are similar including risk of bleeding, risk of infection, and risk of needing additional surgeries. We  have discussed the likely need for an overnight stay with a mastectomy and a drain that will remain in place for about 1 week.     She is going to think about her options.          Future Appointments  Date Time Provider Department Center  09/12/2023  2:30 PM Lucretia Roers, MD [facility] None  09/27/2023  9:30 AM [facility] - ECHO 1 OUTPATIENT [facility]  10/22/2023 11:45 AM Doreatha Massed, MD [facility] None  02/19/2024 11:00 AM Tommie Sams, DO [facility]  03/09/2024  1:30 PM Nida, Denman George, MD [facility] None  05/15/2024 10:30 AM [facility]-ANNUAL WELLNESS VISIT [facility]      All questions were answered to the satisfaction of the patient and family.     Lucretia Roers 09/10/2023, 9:16 AM    12/19 Discussion:  Discussed option further with patient. She wants to pursue a partial mastectomy and SLNB. She is aware of the risk of needing additional surgery if margins positive and of radiation treatment. Discussed risk of bleeding, infection, needing more treatment. All questions  have been answered. This lesions is palpable so no need for a radiofrequency tag to be placed.  Algis Greenhouse, MD [facility] [address] [place], [place] [address] [phone] (office)

## 2023-09-20 ENCOUNTER — Other Ambulatory Visit: Payer: Self-pay

## 2023-09-20 ENCOUNTER — Encounter (HOSPITAL_COMMUNITY): Payer: Self-pay

## 2023-09-23 ENCOUNTER — Encounter (HOSPITAL_COMMUNITY)
Admission: RE | Admit: 2023-09-23 | Discharge: 2023-09-23 | Disposition: A | Discharge status: Home / Self Care | Payer: Medicare Other | Source: Ambulatory Visit | Attending: General Surgery | Admitting: General Surgery

## 2023-09-26 ENCOUNTER — Encounter (HOSPITAL_COMMUNITY): Payer: Self-pay | Admitting: General Surgery

## 2023-09-26 ENCOUNTER — Ambulatory Visit (HOSPITAL_BASED_OUTPATIENT_CLINIC_OR_DEPARTMENT_OTHER): Payer: Medicare Other | Admitting: Anesthesiology

## 2023-09-26 ENCOUNTER — Other Ambulatory Visit: Payer: Self-pay

## 2023-09-26 ENCOUNTER — Ambulatory Visit (HOSPITAL_COMMUNITY)
Admission: RE | Admit: 2023-09-26 | Discharge: 2023-09-26 | Disposition: A | Discharge status: Home / Self Care | Payer: Medicare Other | Attending: General Surgery | Admitting: General Surgery

## 2023-09-26 ENCOUNTER — Encounter (HOSPITAL_COMMUNITY)
Admission: RE | Disposition: A | Discharge status: Home / Self Care | Payer: Self-pay | Source: Home / Self Care | Attending: General Surgery

## 2023-09-26 ENCOUNTER — Ambulatory Visit (HOSPITAL_COMMUNITY): Payer: Medicare Other | Admitting: Anesthesiology

## 2023-09-26 ENCOUNTER — Ambulatory Visit (HOSPITAL_COMMUNITY): Payer: Medicare Other

## 2023-09-26 DIAGNOSIS — C50412 Malignant neoplasm of upper-outer quadrant of left female breast: Secondary | ICD-10-CM | POA: Diagnosis present

## 2023-09-26 DIAGNOSIS — C50912 Malignant neoplasm of unspecified site of left female breast: Secondary | ICD-10-CM | POA: Diagnosis not present

## 2023-09-26 DIAGNOSIS — Z170 Estrogen receptor positive status [ER+]: Secondary | ICD-10-CM | POA: Insufficient documentation

## 2023-09-26 DIAGNOSIS — I10 Essential (primary) hypertension: Secondary | ICD-10-CM | POA: Insufficient documentation

## 2023-09-26 DIAGNOSIS — R928 Other abnormal and inconclusive findings on diagnostic imaging of breast: Secondary | ICD-10-CM | POA: Diagnosis not present

## 2023-09-26 DIAGNOSIS — F319 Bipolar disorder, unspecified: Secondary | ICD-10-CM | POA: Diagnosis not present

## 2023-09-26 DIAGNOSIS — D0512 Intraductal carcinoma in situ of left breast: Secondary | ICD-10-CM

## 2023-09-26 DIAGNOSIS — Z1731 Human epidermal growth factor receptor 2 positive status: Secondary | ICD-10-CM | POA: Diagnosis not present

## 2023-09-26 DIAGNOSIS — Z1722 Progesterone receptor negative status: Secondary | ICD-10-CM | POA: Diagnosis not present

## 2023-09-26 DIAGNOSIS — Z87891 Personal history of nicotine dependence: Secondary | ICD-10-CM | POA: Diagnosis not present

## 2023-09-26 HISTORY — PX: PARTIAL MASTECTOMY WITH AXILLARY SENTINEL LYMPH NODE BIOPSY: SHX6004

## 2023-09-26 HISTORY — PX: BREAST LUMPECTOMY: SHX2

## 2023-09-26 SURGERY — PARTIAL MASTECTOMY WITH AXILLARY SENTINEL LYMPH NODE BIOPSY
Anesthesia: General | Site: Breast | Laterality: Left

## 2023-09-26 MED ORDER — IPRATROPIUM-ALBUTEROL 0.5-2.5 (3) MG/3ML IN SOLN
3.0000 mL | Freq: Once | RESPIRATORY_TRACT | Status: AC
  Administered 2023-09-26: 3 mL via RESPIRATORY_TRACT

## 2023-09-26 MED ORDER — OXYCODONE HCL 5 MG PO TABS: 5.0000 mg | ORAL_TABLET | Freq: Once | ORAL | Status: DC | PRN

## 2023-09-26 MED ORDER — ONDANSETRON HCL 4 MG/2ML IJ SOLN
INTRAMUSCULAR | Status: DC | PRN
  Administered 2023-09-26: 4 mg via INTRAVENOUS

## 2023-09-26 MED ORDER — LACTATED RINGERS IV SOLN: INTRAVENOUS | Status: DC

## 2023-09-26 MED ORDER — MIDAZOLAM HCL 5 MG/5ML IJ SOLN
INTRAMUSCULAR | Status: DC | PRN
  Administered 2023-09-26: 2 mg via INTRAVENOUS

## 2023-09-26 MED ORDER — PROPOFOL 10 MG/ML IV BOLUS
INTRAVENOUS | Status: DC | PRN
  Administered 2023-09-26: 50 mg via INTRAVENOUS
  Administered 2023-09-26: 150 mg via INTRAVENOUS

## 2023-09-26 MED ORDER — PHENYLEPHRINE 80 MCG/ML (10ML) SYRINGE FOR IV PUSH (FOR BLOOD PRESSURE SUPPORT)
PREFILLED_SYRINGE | INTRAVENOUS | Status: DC | PRN
Start: 2023-09-26 — End: 2023-09-26
  Administered 2023-09-26 (×5): 160 ug via INTRAVENOUS

## 2023-09-26 MED ORDER — LACTATED RINGERS IV SOLN: INTRAVENOUS | Status: DC | PRN

## 2023-09-26 MED ORDER — DEXAMETHASONE SODIUM PHOSPHATE 10 MG/ML IJ SOLN
INTRAMUSCULAR | Status: DC | PRN
  Administered 2023-09-26: 5 mg via INTRAVENOUS

## 2023-09-26 MED ORDER — CHLORHEXIDINE GLUCONATE 0.12 % MT SOLN
15.0000 mL | Freq: Once | OROMUCOSAL | Status: DC
Start: 2023-09-26 — End: 2023-09-26

## 2023-09-26 MED ORDER — FENTANYL CITRATE (PF) 100 MCG/2ML IJ SOLN
INTRAMUSCULAR | Status: AC
  Filled 2023-09-26: qty 2

## 2023-09-26 MED ORDER — PROPOFOL 10 MG/ML IV BOLUS
INTRAVENOUS | Status: AC
  Filled 2023-09-26: qty 20

## 2023-09-26 MED ORDER — 0.9 % SODIUM CHLORIDE (POUR BTL) OPTIME
TOPICAL | Status: DC | PRN
  Administered 2023-09-26: 1000 mL

## 2023-09-26 MED ORDER — FENTANYL CITRATE (PF) 100 MCG/2ML IJ SOLN
INTRAMUSCULAR | Status: DC | PRN
  Administered 2023-09-26: 100 ug via INTRAVENOUS
  Administered 2023-09-26 (×2): 25 ug via INTRAVENOUS
  Administered 2023-09-26: 50 ug via INTRAVENOUS

## 2023-09-26 MED ORDER — IPRATROPIUM-ALBUTEROL 0.5-2.5 (3) MG/3ML IN SOLN
RESPIRATORY_TRACT | Status: AC
  Filled 2023-09-26: qty 3

## 2023-09-26 MED ORDER — CEFAZOLIN SODIUM-DEXTROSE 2-4 GM/100ML-% IV SOLN
2.0000 g | INTRAVENOUS | Status: AC
  Administered 2023-09-26: 2 g via INTRAVENOUS

## 2023-09-26 MED ORDER — CEFAZOLIN SODIUM-DEXTROSE 2-4 GM/100ML-% IV SOLN
INTRAVENOUS | Status: AC
  Filled 2023-09-26: qty 100

## 2023-09-26 MED ORDER — MAGTRACE LYMPHATIC TRACER
INTRAMUSCULAR | Status: DC | PRN
  Administered 2023-09-26: 2 mL via INTRAMUSCULAR

## 2023-09-26 MED ORDER — OXYCODONE HCL 5 MG/5ML PO SOLN: 5.0000 mg | Freq: Once | ORAL | Status: DC | PRN

## 2023-09-26 MED ORDER — CHLORHEXIDINE GLUCONATE CLOTH 2 % EX PADS: 6.0000 | MEDICATED_PAD | Freq: Once | CUTANEOUS | Status: DC

## 2023-09-26 MED ORDER — BUPIVACAINE HCL (PF) 0.5 % IJ SOLN
INTRAMUSCULAR | Status: AC
  Filled 2023-09-26: qty 30

## 2023-09-26 MED ORDER — EPHEDRINE SULFATE-NACL 50-0.9 MG/10ML-% IV SOSY
PREFILLED_SYRINGE | INTRAVENOUS | Status: DC | PRN
  Administered 2023-09-26 (×2): 5 mg via INTRAVENOUS

## 2023-09-26 MED ORDER — ONDANSETRON HCL 4 MG/2ML IJ SOLN: 4.0000 mg | Freq: Once | INTRAMUSCULAR | Status: DC | PRN

## 2023-09-26 MED ORDER — CHLORHEXIDINE GLUCONATE 0.12 % MT SOLN
OROMUCOSAL | Status: AC
  Filled 2023-09-26: qty 15

## 2023-09-26 MED ORDER — ORAL CARE MOUTH RINSE
15.0000 mL | Freq: Once | OROMUCOSAL | Status: DC
Start: 2023-09-26 — End: 2023-09-26

## 2023-09-26 MED ORDER — BUPIVACAINE HCL (PF) 0.5 % IJ SOLN
INTRAMUSCULAR | Status: DC | PRN
  Administered 2023-09-26: 30 mL

## 2023-09-26 MED ORDER — HYDROCODONE-ACETAMINOPHEN 5-325 MG PO TABS: 1.0000 | ORAL_TABLET | ORAL | 0 refills | Status: DC | PRN

## 2023-09-26 MED ORDER — MIDAZOLAM HCL 2 MG/2ML IJ SOLN
INTRAMUSCULAR | Status: AC
  Filled 2023-09-26: qty 2

## 2023-09-26 MED ORDER — PROPOFOL 10 MG/ML IV BOLUS
INTRAVENOUS | Status: AC
Start: 2023-09-26 — End: ?
  Filled 2023-09-26: qty 20

## 2023-09-26 MED ORDER — FENTANYL CITRATE PF 50 MCG/ML IJ SOSY
25.0000 ug | PREFILLED_SYRINGE | INTRAMUSCULAR | Status: DC | PRN
  Administered 2023-09-26 (×2): 25 ug via INTRAVENOUS
  Filled 2023-09-26: qty 1

## 2023-09-26 MED ORDER — ONDANSETRON HCL 4 MG PO TABS: 4.0000 mg | ORAL_TABLET | Freq: Three times a day (TID) | ORAL | 1 refills | Status: DC | PRN

## 2023-09-26 SURGICAL SUPPLY — 33 items
APPLIER CLIP 9.375 SM OPEN (CLIP) ×2
BLADE SURG 15 STRL LF DISP TIS (BLADE) ×1 IMPLANT
CHLORAPREP W/TINT 26 (MISCELLANEOUS) ×1 IMPLANT
CLIP APPLIE 9.375 SM OPEN (CLIP) ×1 IMPLANT
COVER LIGHT HANDLE STERIS (MISCELLANEOUS) ×1 IMPLANT
COVER PROBE W GEL 5X96 (DRAPES) ×1 IMPLANT
DECANTER SPIKE VIAL GLASS SM (MISCELLANEOUS) ×1 IMPLANT
DERMABOND ADVANCED .7 DNX12 (GAUZE/BANDAGES/DRESSINGS) ×1 IMPLANT
DEVICE DUBIN SPECIMEN MAMMOGRA (MISCELLANEOUS) ×1 IMPLANT
ELECT REM PT RETURN 9FT ADLT (ELECTROSURGICAL) ×1
ELECTRODE REM PT RTRN 9FT ADLT (ELECTROSURGICAL) ×1 IMPLANT
EVACUATOR DRAINAGE 7X20 100CC (MISCELLANEOUS) IMPLANT
GLOVE BIO SURGEON STRL SZ 6.5 (GLOVE) ×1 IMPLANT
GLOVE BIOGEL PI IND STRL 6.5 (GLOVE) ×1 IMPLANT
GLOVE BIOGEL PI IND STRL 7.0 (GLOVE) ×2 IMPLANT
GOWN STRL REUS W/TWL LRG LVL3 (GOWN DISPOSABLE) ×2 IMPLANT
KIT MARKER MARGIN INK (KITS) ×1 IMPLANT
KIT TURNOVER KIT A (KITS) ×1 IMPLANT
NDL HYPO 18GX1.5 BLUNT FILL (NEEDLE) ×1 IMPLANT
NDL HYPO 25X1 1.5 SAFETY (NEEDLE) ×2 IMPLANT
NEEDLE HYPO 18GX1.5 BLUNT FILL (NEEDLE) ×1
NEEDLE HYPO 25X1 1.5 SAFETY (NEEDLE) ×2
NS IRRIG 1000ML POUR BTL (IV SOLUTION) ×1 IMPLANT
PACK MINOR (CUSTOM PROCEDURE TRAY) ×1 IMPLANT
PAD ARMBOARD 7.5X6 YLW CONV (MISCELLANEOUS) ×2 IMPLANT
POSITIONER HEAD 8X9X4 ADT (SOFTGOODS) ×1 IMPLANT
SET BASIN LINEN APH (SET/KITS/TRAYS/PACK) ×1 IMPLANT
SPONGE DRAIN TRACH 4X4 STRL 2S (GAUZE/BANDAGES/DRESSINGS) IMPLANT
SUT ETHILON 3 0 PS 1 (SUTURE) IMPLANT
SUT MNCRL AB 4-0 PS2 18 (SUTURE) ×2 IMPLANT
SUT VIC AB 3-0 SH 27X BRD (SUTURE) ×2 IMPLANT
SYR BULB IRRIG 60ML STRL (SYRINGE) ×1 IMPLANT
SYR CONTROL 10ML LL (SYRINGE) ×2 IMPLANT

## 2023-09-26 NOTE — Interval H&P Note (Signed)
 History and Physical Interval Note:  09/26/2023 9:37 AM  Pamela Henson  has presented today for surgery, with the diagnosis of LEFT BREAST CANCER ER POSITIVE.  The various methods of treatment have been discussed with the patient and family. After consideration of risks, benefits and other options for treatment, the patient has consented to  Procedure(s): PARTIAL MASTECTOMY WITH AXILLARY SENTINEL LYMPH NODE BIOPSY (Left) as a surgical intervention.  The patient's history has been reviewed, patient examined, no change in status, stable for surgery.  I have reviewed the patient's chart and labs.  Questions were answered to the patient's satisfaction.    Palpable mass noted and marked.  Pamela Henson

## 2023-09-26 NOTE — Transfer of Care (Signed)
 Immediate Anesthesia Transfer of Care Note  Patient: Pamela Henson  Procedure(s) Performed: PARTIAL MASTECTOMY WITH AXILLARY SENTINEL LYMPH NODE BIOPSY (Left: Breast)  Patient Location: PACU  Anesthesia Type:General  Level of Consciousness: awake  Airway & Oxygen  Therapy: Patient Spontanous Breathing and Patient connected to nasal cannula oxygen   Post-op Assessment: Report given to RN and Post -op Vital signs reviewed and stable  Post vital signs: Reviewed and stable  Last Vitals:  Vitals Value Taken Time  BP 133/68   Temp 98.3   Pulse 90   Resp 14 09/26/23 1250  SpO2 94%   Vitals shown include unfiled device data.  Last Pain:  Vitals:   09/26/23 0825  PainSc: 0-No pain         Complications: No notable events documented.

## 2023-09-26 NOTE — Anesthesia Procedure Notes (Signed)
 Procedure Name: LMA Insertion Date/Time: 09/26/2023 10:21 AM  Performed by: Cordella Elvie HERO, CRNAPre-anesthesia Checklist: Patient identified, Emergency Drugs available, Suction available, Patient being monitored and Timeout performed Patient Re-evaluated:Patient Re-evaluated prior to induction Oxygen  Delivery Method: Circle system utilized Preoxygenation: Pre-oxygenation with 100% oxygen  Induction Type: IV induction LMA: LMA inserted LMA Size: 3.0 Number of attempts: 1 Placement Confirmation: positive ETCO2, CO2 detector and breath sounds checked- equal and bilateral Tube secured with: Tape Dental Injury: Teeth and Oropharynx as per pre-operative assessment

## 2023-09-26 NOTE — Progress Notes (Addendum)
 [facility]  Updated her family. JP drain in place. Will send Rx to [facility]. Will see next week to check on drain.   RN to give patient information on how to care for the drain. Give patient some gauze and papertape. Instruct patient's family on how to care for the drain too.  Record output and color daily- form on AVS print out.   Manuelita Pander, MD [facility] [address] [place] [address], [place] [address] [phone] (office)

## 2023-09-26 NOTE — Discharge Instructions (Signed)
 Discharge instructions after breast surgery:   Common Complaints: Pain and bruising at the incision sites.  Swelling at the incision sites. Stiffness of the arm.   Diet/ Activity: Diet as tolerated.  You may shower but do not take hot showers as this can disrupt the glue. Rest and listen to your body, but do not remain in bed all day.  Walk everyday for at least 15-20 minutes. Deep cough and move around every 1-2 hours in the first few days after surgery.  Do not lift > 10 lbs for the first 2 weeks after surgery. Do not do anything that makes you feel like you are putting unnecessary pull or stretch on the incision sites.  Do move your arm and shoulder (see exercises options below). If you do not move then you can get stiff and hurt more.  Do not pick at the dermabond glue on your incision sites.  This glue film will remain in place for 1-2 weeks and will start to peel off.  Do not place lotions or balms on your incision unless instructed to specifically by Dr. Kallie.   Drain Care:  Please keep the drain clean and dry. Replace the gauze/ tape around the drain if it gets dirty or wet/ saturated. Please do not mess with or cut the stitch that is keeping the drain in place. Secure the drain to your clothes so that it does not get dislodged.  Please record the output from the drain daily including the color and the amount in milliliters.  Take a bird-bath between now and when the drain is removed or keep the drain covered with plastic when showering.   Pain Expectations and Narcotics: -After surgery you will have pain associated with your incisions and this is normal. The pain is muscular and nerve pain, and will get better with time. -You are encouraged and expected to take non narcotic medications like tylenol  and ibuprofen (when able) to treat pain as multiple modalities can aid with pain treatment. -Narcotics are only used when pain is severe or there is breakthrough pain. -You are not  expected to have a pain score of 0 after surgery, as we cannot prevent pain. A pain score of 3-4 that allows you to be functional, move, walk, and tolerate some activity is the goal. The pain will continue to improve over the days after surgery and is dependent on your surgery. -Due to [place] law, we are only able to give a certain amount of pain medication to treat post operative pain, and we only give additional narcotics on a patient by patient basis.  -For most laparoscopic surgery, studies have shown that the majority of patients only need 10-15 narcotic pills, and for open surgeries most patients only need 15-20.   -Having appropriate expectations of pain and knowledge of pain management with non narcotics is important as we do not want anyone to become addicted to narcotic pain medication.  -Using ice packs in the first 48 hours and heating pads after 48 hours, wearing an abdominal binder (when recommended), and using over the counter medications are all ways to help with pain management.   -Simple acts like meditation and mindfulness practices after surgery can also help with pain control and research has proven the benefit of these practices.  Medication: Take tylenol  and ibuprofen as needed for pain control Do not exceed 4000mg  of tylenol  a day. Hydrocodone  has 325mg  of tylenol  in it.  Do not exceed 3600mg  of ibuprofen a day. Take Hydrocodone   for breakthrough pain.  Take Colace for constipation related to narcotic pain medication. If you do not have a bowel movement in 2 days, take Miralax  over the counter.  Drink plenty of water to also prevent constipation.   Contact Information: If you have questions or concerns, please call our office, [phone], Monday- Thursday 8AM-5PM and Friday 8AM-12Noon.  If it is after hours or on the weekend, please call [facility], [phone], [phone] and ask to speak to the surgeon on call for Dr. Kallie at [facility].   Exercises After  Breast Surgery Do at least a few of the exercises below twice a day. It is ok to start the day after surgery and gradually build up the amount and type of exercises you do. Link to the exercises with pictures (relaythis.com.au).   Deep Breathing Exercise Deep breathing can help you relax and ease discomfort and tightness around your incision (surgical cut). It's also a very good way to relieve stress during the day.  Sit comfortably in a chair. Take a slow, deep breath through your nose. Let your chest and belly expand. Breathe out slowly through your mouth. Repeat as many times as needed.  Arm and Shoulder Exercises Doing arm and shoulder exercises will help you get back your full range of motion on your affected side (the side where you had your surgery). With full range of motion, you'll be able to: Move your arm over your head and out to the side Move your arm behind your neck Move your arm to the middle of your back Do each of the exercises below 5 times a day. Keep doing this until you have a full range of motion again and can use your arm as you did before surgery in all your normal activities. This includes activities at work, at home, and in recreation or sports. If you had limited movement in your arm before surgery, your goal will be to get back as much movement as you had before.  If you get your full range of motion back quickly, keep doing these exercises once a day instead of 5 times a day. This is especially true if you feel any tightness in your chest, shoulder, or under your affected arm. These exercises can help keep scar tissue from forming in your armpit and shoulder. Scar tissue can limit your arm movements later.  If you still have trouble moving your shoulder 4 weeks after your surgery, tell your surgeon. They'll tell you if you need more rehabilitation, such as physical or occupational therapy.  If you  had one of the following surgeries, you can do the following set of exercises on the first day after your surgery, as long as your surgeon tells you it's safe.  Shoulder rolls The shoulder roll is a good exercise to start with because it gently stretches your chest and shoulder muscles.  Stand or sit comfortably with your arms relaxed at your sides. Start with backward shoulder rolls. In a circular motion, bring your shoulders forward, up, backward, and down. Do this 10 times. Switch directions and do 10 forward shoulder rolls. Bring your shoulders backward, up, forward, and down. Do this 10 times. Try to make the circles as big as you can and move both shoulders at the same time. If you have some tightness across your incision or chest, start with smaller circles and make them bigger as the tightness decreases. The backward direction might feel a little tighter across your chest than the forward  direction. This will get better with practice.  Shoulder wings The shoulder wings exercise will help you get back outward movement of your shoulder. You can do this exercise while sitting or standing.  Place your hands on your chest or collarbone. Raise your elbows out to the side, limiting your range of motion as instructed by your healthcare team. Slowly lower your elbows. Do this 10 times. Then, slowly lower your hands. If you feel discomfort while doing this exercise, hold your position and do the deep breathing exercise. If the discomfort passes, raise your elbows a little higher. If it doesn't pass, don't raise your elbows any higher. Finish the exercise raising your elbows only high enough to feel a gentle stretch and no discomfort.  Arm circles If you had surgery on both breasts, do this exercise with both arms, 1 arm at a time. Don't do this exercise with both arms at the same time. This will put too much pressure on your chest.  Stand with your feet slightly apart for balance. Raise your  affected arm out to the side as high as you can, limiting your range of movement as instructed by your healthcare team. Start making slow, backward circles in the air with your arm. Make sure you're moving your arm from your shoulder, not your elbow. Keep your elbow straight. Increase the size of the circles until they're as big as you can comfortably make them, limiting your range of motion as instructed by your healthcare team. If you feel any aching or if your arm is tired, take a break. Keep doing the exercise when you feel better. Do 10 full backward circles. Then, slowly lower your arm to your side. Rest your arm for a moment. Follow steps 1 to 4 again, but this time make slow, forward circles.  W exercise You can do the W exercise while sitting or standing.  Form a "W" with your arms out to the side and palms facing forward (see Figure 4). Try to bring your hands up so they're even with your face. If you can't raise your arms that high, bring them to the highest comfortable position. Make sure to limit your range of motion as instructed by your healthcare team. Pinch your shoulder blades together and downward, as if you're squeezing a pencil between them. If you feel discomfort, stop at that position and do the deep breathing exercise. If the discomfort passes, try to bring your arms back a little further. If it doesn't pass, don't reach any further. Hold the furthest position that doesn't cause discomfort. Squeeze your shoulder blades together and downward for 5 seconds. Slowly bring your arms back down to the starting position. Repeat this movement 10 times.  Back Climb You can do the back climb stretch while sitting or standing. You'll need a timer or stopwatch.  Place your hands behind your back. Hold the hand on your affected side with your other hand. If you had surgery on both breasts, use the arm that moves most easily to hold the other. Slowly slide your hands up the center of  your back as far as you can. If you feel tightness near your incision, stop at that position and do the deep breathing exercise. If the tightness decreases, try to slide your hands up a little further. If it doesn't decrease, don't slide your hands up any further. Hold the highest position you can for 1 minute. Use your stopwatch or timer to keep track. You should feel a gentle  stretch in your shoulder area. After 1 minute, slowly lower your hands.  Hands behind neck You can do the hands behind neck stretch while sitting or standing. You'll need a timer or stopwatch.  Clasp your hands together on your lap or in front of you. Slowly raise your hands toward your head, keeping your elbows together in front of you, not out to the sides. Keep your head level. Don't bend your neck or head forward. Slide your hands over your head until you reach the back of your neck. When you get to this point, spread your elbows out to the sides. Hold this position for 1 minute. Use your stopwatch or timer to keep track. Breathe normally. Don't hold your breath as you stretch your body. If you have some tightness across your incision or chest, hold your position and do the deep breathing exercise. If the tightness decreases, continue with the movement. If the tightness stays the same, reach up and stretch your elbows back as best as you can without causing discomfort. Hold the position you're most comfortable in for 1 minute. Slowly come out of the stretch by bringing your elbows together and sliding your hands over your head. Then, slowly lower your arms.  Forward wall crawls You'll need 2 pieces of tape for the forward wall crawl exercise.  Stand facing a wall. Your toes should be about 6 inches (15 centimeters) from the wall. Reach as high as you can with your unaffected arm. Oneil that point with a piece of tape. This will be the goal for your affected arm. If you had surgery on both breasts, set your goal using the  arm that moves most comfortably. Place both hands against the wall at a level that's comfortable. Crawl your fingers up the wall as far as you can, keeping them even with each other.. Try not to look up toward your hands or arch your back. When you get to the point where you feel a good stretch, but not pain, do the deep breathing exercise. Return to the starting position by crawling your fingers back down the wall. Repeat the wall crawl 10 times. Each time you raise your hands, try to crawl a little bit higher. On the 10th crawl, use the other piece of tape to mark the highest point you reached with your affected arm. This will let you to see your progress each time you do this exercise. As you become more flexible, you may need to take a step closer to the wall so you can reach a little higher.   Side wall crawls You'll also need 2 pieces of tape for the side wall crawl exercise.  You shouldn't feel pain while doing this exercise. It's normal to feel some tightness or pulling across the side of your chest. Focus on your breathing until the tightness decreases. Breathe normally throughout this exercise. Don't hold your breath.  Be careful not to turn your body toward the wall while doing this exercise. Make sure only the side of your body faces the wall.  If you had surgery on both breasts, start with step 3.  Stand with your unaffected side closest to the wall, about 1 foot (30.5 centimeters) away from the wall. Reach as high as you can with your unaffected arm. Oneil that point with a piece of tape (see Figure 8). This will be the goal for your affected arm. Turn your body so your affected side is now closest to the wall. If you had  surgery on both breasts, start with either side closest to the wall. Crawl your fingers up the wall as far as you can. When you get to the point where you feel a good stretch, but not pain, do the deep breathing exercise. Return to the starting position by crawling  your fingers back down the wall. Repeat this exercise 10 times. On your 10th crawl, use a piece of tape to mark the highest point you reached with your affected arm. This will let you see your progress each time you do the exercise. If you had surgery on both breasts, repeat the exercise with your other arm.  Swelling After your surgery, you may have some swelling or puffiness in your hand or arm on your affected side. This is normal and usually goes away on its own.  If you notice swelling in your hand or arm, follow the tips below to help the swelling go away.  Raise your arm above your head and do hand pumps several times a day. To do hand pumps, slowly open and close your fist 10 times. This will help drain the fluid out of your arm. Don't hold your arm straight up over your head for more than a few minutes. This can cause your arm muscles to get tired. Raise your arm to the side a few times a day for about 20 minutes at a time. To do this, sit or lie down on your back. Rest your arm on a few pillows next to you so it's raised above the level of your heart. If you're able to sleep on your unaffected side, you can place 1 or 2 pillows in front of you and rest your affected arm on them while you sleep. If the swelling doesn't go down within 4 to 6 weeks, call your surgeon or nurse.

## 2023-09-26 NOTE — Progress Notes (Signed)
 Teaching done with Arzella Handing Apple on care of JP drain, emptying and measuring, extra guaze and tape sent for dressing changes as needed per discharge instructions, Verified with Dr. Kallie no breast binder needed. Patient had asked about having one on discharge according to pre admit paper work.  Patient instructed in detail on cough, deep breath, spirometry and moving post op for prevention of respiratory complications.    Air exchange and oxygen  saturation improved to meet discharge criteria after nebulizer treatment

## 2023-09-26 NOTE — Anesthesia Preprocedure Evaluation (Signed)
 Anesthesia Evaluation  Patient identified by MRN, date of birth, ID band Patient awake    Reviewed: Allergy  & Precautions, H&P , NPO status , Patient's Chart, lab work & pertinent test results, reviewed documented beta blocker date and time   History of Anesthesia Complications (+) PONV and history of anesthetic complications  Airway Mallampati: II  TM Distance: >3 FB Neck ROM: full    Dental no notable dental hx.    Pulmonary neg pulmonary ROS, former smoker   Pulmonary exam normal breath sounds clear to auscultation       Cardiovascular Exercise Tolerance: Good hypertension, negative cardio ROS  Rhythm:regular Rate:Normal     Neuro/Psych  Headaches PSYCHIATRIC DISORDERS   Bipolar Disorder   negative neurological ROS  negative psych ROS   GI/Hepatic negative GI ROS, Neg liver ROS,,,  Endo/Other  negative endocrine ROS    Renal/GU negative Renal ROS  negative genitourinary   Musculoskeletal   Abdominal   Peds  Hematology negative hematology ROS (+) Blood dyscrasia, anemia   Anesthesia Other Findings   Reproductive/Obstetrics negative OB ROS                             Anesthesia Physical Anesthesia Plan  ASA: 2  Anesthesia Plan: General and General LMA   Post-op Pain Management:    Induction:   PONV Risk Score and Plan: Ondansetron   Airway Management Planned:   Additional Equipment:   Intra-op Plan:   Post-operative Plan:   Informed Consent: I have reviewed the patients History and Physical, chart, labs and discussed the procedure including the risks, benefits and alternatives for the proposed anesthesia with the patient or authorized representative who has indicated his/her understanding and acceptance.     Dental Advisory Given  Plan Discussed with: CRNA  Anesthesia Plan Comments:        Anesthesia Quick Evaluation

## 2023-09-26 NOTE — Op Note (Signed)
 [facility] Operative Note  09/26/23  Preoperative Diagnosis: Left breast cancer    Postoperative Diagnosis: Same   Procedure(s) Performed: Partial mastectomy left breast and sentinel lymph node biopsy after Magtrace injection; JP drain placement    Surgeon: Manuelita BROCKS. Kallie, MD   Assistants: No qualified resident was available    Anesthesia: General endotracheal   Anesthesiologist: Kendell Yvonna PARAS, MD    Specimens: Left breast cancer- painted; additional superior and medial margins -painted; lymph nodes X 3    Estimated Blood Loss: Minimal   Blood Replacement: None    Complications: None   Wound Class: Clean    Operative Indications: Ms. Niehaus is a 73 yo who comes in with a left breast cancer. We discussed her options for surgery and she opted to a partial mastectomy and sentinel lymph node biopsy. We discussed the potential need for more therapy or surgery, and risk of bleeding, infection. We discussed sentinel node sampling and risk of bleeding, lymph edema.   Findings: Large palpable mass excised with margins; small lymph nodes with significant fatty replacement, very loose frail tissue    Procedure: The patient was taken to the operating room and placed supine. General endotracheal anesthesia was induced. Intravenous antibiotics were administered per protocol. Magtrace was injected after a timeout was performed. Her breast was massaged for 5 minutes. The left breast and axilla were prepped and draped in the usual sterile fashion.   I checked the Magtrace and there was adequate uptake at the areola 10000+ readings but only minimal uptake in the axillary hairline region 40-50 readings.    Her mass was palpable in the 3 O'clock position. This was very close to the skin. I had reviewed the post biopsy mammograms and on the CC this was within a 1 centimeter.  I opted to excise an ellipse of skin for the anterior margin to ensure adequate margins all around.   I made an ellipse on the skin and created flaps down allowing for adequate margin around the 2.1cm mass. This was carried down to the chest wall posteriorly. The mass was painted in the standard fashion. There was some fibrous changes on the superior and medial margin, and I opted to take additional margins in these two locations and painted them.  This was packed off. The specimen was sent for mammogram and the biopsy clip was in place.   I then turned my attention to the axilla. I made an incision at the hairline as the uptake was minimal uptake. I carried the incision down into the fatty tissue of the axilla with cautery. Her tissue and skin were very loose and mobile. With minimal manipulation the skin and axillary fat were pulled away. There were no obvious hot spots noted with the Magtrace. Every now and then I would get uptake of about 40 but then could not identify a node either by palpation or by any brown color.  I was all the way to her chest wall noting the muscles and the nerves. For over an hour we searched for lymph nodes, finally finding three nodes that had minimal uptake in vivo 30-50 and then no uptake ex vivo.  In total I was able to find 3 lymph nodes but they all appeared to have some degree of fatty replacement. I could easily visualize the patient's axillary vein and noted the the long thoracic and the intercostal brachial nerve which were protected. I could not palpate or visualize any other nodes.  The background after  these were removed was below baseline.   Given her loose tissue and large cavity created with the exploration for nodes, I opted to place a JP drain that went through the partial mastectomy and axillary cavity to drain any potential seroma I was afraid she would create given the tissue laxity. Hemostasis was confirmed. Local was injected into both incisions.   I closed both incisions with 3-0 Vicryl interrupted to close the flaps, and then closed the skin with 4-0  Monocryl subcuticular and dermabond. The JP was secured with a 3-0 Nylon and drain sponge.   Final inspection revealed acceptable hemostasis. All counts were correct at the end of the case. The patient was awakened from anesthesia and extubated without complication.  The patient went to the PACU in stable condition.   Manuelita Pander, MD [facility] [address], [place] [address] [phone] (office)

## 2023-09-27 ENCOUNTER — Ambulatory Visit (HOSPITAL_COMMUNITY): Payer: Medicare Other

## 2023-09-27 ENCOUNTER — Encounter (HOSPITAL_COMMUNITY): Payer: Self-pay | Admitting: General Surgery

## 2023-09-27 NOTE — Anesthesia Postprocedure Evaluation (Signed)
 Anesthesia Post Note  Patient: Pamela Henson  Procedure(s) Performed: PARTIAL MASTECTOMY WITH AXILLARY SENTINEL LYMPH NODE BIOPSY (Left: Breast)  Patient location during evaluation: Phase II Anesthesia Type: General Level of consciousness: awake Pain management: pain level controlled Vital Signs Assessment: post-procedure vital signs reviewed and stable Respiratory status: spontaneous breathing and respiratory function stable Cardiovascular status: blood pressure returned to baseline and stable Postop Assessment: no headache and no apparent nausea or vomiting Anesthetic complications: no Comments: Late entry   No notable events documented.   Last Vitals:  Vitals:   09/26/23 1435 09/26/23 1500  BP:    Pulse:    Resp:    Temp:    SpO2: 92% 90%    Last Pain:  Vitals:   09/26/23 1500  TempSrc:   PainSc: 5                  Pamela Henson

## 2023-10-01 LAB — SURGICAL PATHOLOGY

## 2023-10-02 NOTE — Progress Notes (Signed)
 Updated patient on the pathology, margins all good, lymph nodes negative. JP drain is doing well. Will plan to get that tomorrow.

## 2023-10-03 ENCOUNTER — Encounter: Payer: Self-pay | Admitting: General Surgery

## 2023-10-03 ENCOUNTER — Ambulatory Visit (INDEPENDENT_AMBULATORY_CARE_PROVIDER_SITE_OTHER): Payer: Medicare Other | Admitting: General Surgery

## 2023-10-03 VITALS — BP 150/87 | HR 86 | Temp 98.1°F | Resp 14 | Ht 63.0 in | Wt 127.0 lb

## 2023-10-03 DIAGNOSIS — Z170 Estrogen receptor positive status [ER+]: Secondary | ICD-10-CM

## 2023-10-03 DIAGNOSIS — C50412 Malignant neoplasm of upper-outer quadrant of left female breast: Secondary | ICD-10-CM

## 2023-10-03 NOTE — Progress Notes (Signed)
 [facility]  Doing well. No major issues. Drain with < 20 cc of SS fluid a day.  BP (!) 150/87   Pulse 86   Temp 98.1 F (36.7 C) (Oral)   Resp 14   Ht 5' 3 (1.6 m)   Wt 127 lb (57.6 kg)   SpO2 90%   BMI 22.50 kg/m  Incision healing, no erythema or drainage on left breast, JP removed and dressing placed  Patient s/p left partial mastectomy, SLNB. Doing well.  Keep dressing in place for 48 hours. You can shower after that.    Future Appointments  Date Time Provider Department Center  10/22/2023 10:15 AM Kallie Manuelita BROCKS, MD [facility] None  10/22/2023 11:45 AM Rogers Hai, MD [facility] None  02/19/2024 11:00 AM Bluford Jacqulyn MATSU, DO [facility]  03/09/2024  1:30 PM Lenis Ethelle ORN, MD [facility] None  05/15/2024 10:40 AM [facility]AL FERDIE VISIT [facility]   Manuelita Kallie, MD [facility] [address] [place], [place] [address] [phone] (office)

## 2023-10-03 NOTE — Patient Instructions (Signed)
 Keep dressing in place for 48 hours. You can shower after that.

## 2023-10-09 ENCOUNTER — Telehealth: Payer: Self-pay | Admitting: "Endocrinology

## 2023-10-09 NOTE — Telephone Encounter (Signed)
 No PA required for Reclast  through Medicare part B at [facility] infusion center.

## 2023-10-09 NOTE — Telephone Encounter (Signed)
 Noted.

## 2023-10-10 ENCOUNTER — Telehealth: Payer: Self-pay

## 2023-10-10 NOTE — Telephone Encounter (Signed)
Tried to return call to pt but did not receive an answer and was unable to leave a message.

## 2023-10-11 ENCOUNTER — Other Ambulatory Visit: Payer: Self-pay

## 2023-10-11 ENCOUNTER — Telehealth: Payer: Self-pay

## 2023-10-11 DIAGNOSIS — M810 Age-related osteoporosis without current pathological fracture: Secondary | ICD-10-CM | POA: Insufficient documentation

## 2023-10-11 NOTE — Telephone Encounter (Signed)
Dr. Fransico Him, patient will be scheduled as soon as possible.  Auth Submission: NO AUTH NEEDED Site of care: Site of care: [facility] Payer: BCBS medicare Medication & CPT/J Code(s) submitted: Reclast (Zolendronic acid) [id number] Route of submission (phone, fax, portal):  Phone # Fax # Auth type: Buy/Bill PB Units/visits requested: 5mg  x 1 dose Reference number:  Approval from: 10/11/23 to 09/23/24

## 2023-10-16 ENCOUNTER — Encounter: Payer: Medicare Other | Attending: "Endocrinology | Admitting: Internal Medicine

## 2023-10-16 VITALS — BP 112/71 | HR 65 | Temp 98.3°F | Resp 16

## 2023-10-16 DIAGNOSIS — M810 Age-related osteoporosis without current pathological fracture: Secondary | ICD-10-CM

## 2023-10-16 MED ORDER — DIPHENHYDRAMINE HCL 25 MG PO CAPS
25.0000 mg | ORAL_CAPSULE | Freq: Once | ORAL | Status: AC
  Administered 2023-10-16: 25 mg via ORAL

## 2023-10-16 MED ORDER — ACETAMINOPHEN 325 MG PO TABS
650.0000 mg | ORAL_TABLET | Freq: Once | ORAL | Status: AC
  Administered 2023-10-16: 650 mg via ORAL

## 2023-10-16 MED ORDER — ZOLEDRONIC ACID 5 MG/100ML IV SOLN
5.0000 mg | Freq: Once | INTRAVENOUS | Status: AC
  Administered 2023-10-16: 5 mg via INTRAVENOUS

## 2023-10-16 NOTE — Progress Notes (Signed)
Diagnosis: Osteoporosis  Provider:  Danella Deis MD  Procedure: IV Infusion  IV Type: Peripheral, IV Location: R Antecubital  Reclast (Zolendronic Acid), Dose: 5 mg  Infusion Start Time: 1458  Infusion Stop Time: 1528  Post Infusion IV Care: Observation period completed  Discharge: Condition: Good, Destination: Home . AVS Provided  Performed by:  Cleotilde Neer, LPN

## 2023-10-17 DIAGNOSIS — F331 Major depressive disorder, recurrent, moderate: Secondary | ICD-10-CM | POA: Diagnosis not present

## 2023-10-17 DIAGNOSIS — F411 Generalized anxiety disorder: Secondary | ICD-10-CM | POA: Diagnosis not present

## 2023-10-17 DIAGNOSIS — F410 Panic disorder [episodic paroxysmal anxiety] without agoraphobia: Secondary | ICD-10-CM | POA: Diagnosis not present

## 2023-10-17 NOTE — Addendum Note (Signed)
Addended by: Cleotilde Neer on: 10/17/2023 10:59 AM   Modules accepted: Orders

## 2023-10-21 NOTE — Progress Notes (Signed)
[facility] [address] [place] [address]   Clinic Day:  10/22/2023  Referring physician: Tommie Sams, DO  Patient Care Team: Tommie Sams, DO as PCP - General (Family Medicine) Andee Poles, MD as Consulting Physician (Psychiatry) Deatra Robinson, NP as Nurse Practitioner (Psychiatry) Doreatha Massed, MD as Medical Oncologist (Medical Oncology) Therese Sarah, RN as Oncology Nurse Navigator (Medical Oncology)   ASSESSMENT & PLAN:   Assessment:  1.  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 - Bilateral diagnostic mammogram/ultrasound (08/27/2023): 2.1 cm mass in the left breast 3 o'clock position.  No lymphadenopathy in the left axilla. 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  Ki-67 5%.  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 - Left breast lumpectomy and SLNB (09/25/2022) by Dr. Henreitta Leber - Pathology: 2.4 cm grade 2 IDC, margins negative, negative LVI/PNI.  0/3 lymph nodes involved with cancer.  pT2 pN0.  2.  Social/family history: - Lives at home by herself and is independent of ADLs and IADLs.  She retired after working at [employer].  She quit smoking 1 year ago.  Smoked half pack per day on and off since age 83.  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 - Paternal aunt died of cancer.  Type not known to the patient.  Plan:  1.  T2 N0 G2 left breast ER/HER2 positive IDC: - We discussed pathology report in detail. - Recommended adjuvant chemotherapy with Herceptin.  We talked about TCH regimen every 3 weeks x 6 cycles followed by Herceptin continued for a total of 1 year. - Recommend port placement. - We also discussed that she will be started on antiestrogen therapy upon completion of 6 cycles of chemotherapy.  She will also be referred to radiation oncology. - We discussed the regimen and  side effects in detail.  She will start treatment after port placement.  2.  Osteoporosis (DEXA 07/31/2023 T -4.3): - Will order bone density test.  Will also check vitamin D levels at some point. - Received first Reclast on 10/16/2023.  3.  History of stroke and DVT: - CVA at age 21.  DVT in March 2024 after fracture, treated with Eliquis and was taken off due to side effects. - However repeat ultrasound was negative for DVT. - Will consider checking for antiphospholipid antibody testing.  4.  High risk drug monitoring: - Reviewed 2D echo from 10/22/2023 with LVEF 60-65%.   No orders of the defined types were placed in this encounter.     Alben Deeds Teague,acting as a Neurosurgeon for Doreatha Massed, MD.,have documented all relevant documentation on the behalf of Doreatha Massed, MD,as directed by  Doreatha Massed, MD while in the presence of Doreatha Massed, MD.  I, Doreatha Massed MD, have reviewed the above documentation for accuracy and completeness, and I agree with the above.    Doreatha Massed, MD   1/28/20255:47 PM  CHIEF COMPLAINT/PURPOSE OF CONSULT:   Diagnosis: left breast cancer, ER+/HER2+   Cancer Staging  Breast cancer of upper-outer quadrant of left female breast [facility]) Staging form: Breast, AJCC 8th Edition - Clinical stage from 09/03/2023: cT2, cN0, cM0, G2, PR-, HER2+ - Unsigned    Prior Therapy: none  Current Therapy: TCH   HISTORY OF PRESENT ILLNESS:   Oncology History   No history  exists.      Pamela Henson is a 73 y.o. female presenting to clinic today for evaluation of left breast cancer, ER+/HER2+ at the request of Dr. Adriana Simas.  She presented for routine mammogram on 08/27/23 with a palpable area in her left breast. She proceeded with diagnostic bilateral mammogram and left breast US showing: suspicious palpable 2.1 cm mass in left breast at 3 o'clock.  She underwent left breast biopsy on 08/30/23. Pathology showed: invasive ductal  carcinoma, grade 2; DCIS, intermediate grade, without necrosis. Prognostic panel significant for: ER 95% positive, PR 0% negative, HER2 positive (3+), with Ki-67 of 5%.  Today, she states that she is doing well overall. Her appetite level is at 100%. Her energy level is at 100%.  INTERVAL HISTORY:   Pamela Henson is a 73 y.o. female presenting to the clinic today for follow-up of left breast cancer. She was last seen by me on 09/04/23 in consultation.  Since her last visit, she underwent a partial left mastectomy with sentinel lymph node biopsy on 09/26/23 with Dr. Henreitta Leber. Pathology of the lumpectomy mass revealed: invasive ductal carcinoma, 2.4 cm in size, grade 2. All lymph nodes were negative for carcinoma. She had an appointment with Dr. Henreitta Leber this morning with her surgery site being well healed.   She had a CT chest done on 09/06/23 that found: no new or progressive interval findings to suggest metastatic disease in the chest.  Today, she states that she is doing well overall. Her appetite level is at 100%. Her energy level is at 75%. She is accompanied by a family member. She reports one episode of a stroke several years ago. She has a history of 2 DVT's after a surgery in the past.   She recently had a DEXA scan that showed she was osteoporotic . She started Reclast injections last week.   PAST MEDICAL HISTORY:   Past Medical History: Past Medical History:  Diagnosis Date   Anemia    Bipolar 1 disorder (HCC)    Bipolar 1 disorder (HCC)    Complication of anesthesia    Headache    History of chicken pox    History of measles    History of mumps    PONV (postoperative nausea and vomiting)    Urine retention    takes Flomax    Surgical History: Past Surgical History:  Procedure Laterality Date   ABDOMINAL HYSTERECTOMY  09/24/1977   age 27 due to vaginal bleeding   BREAST BIOPSY Left 08/30/2023   [place] LT BREAST BX W LOC DEV 1ST LESION IMG BX SPEC US GUIDE 08/30/2023 [facility]  MAMMOGRAPHY   COLONOSCOPY N/A 10/30/2017   Procedure: COLONOSCOPY;  Surgeon: Malissa Hippo, MD;  Location: [facility];  Service: Endoscopy;  Laterality: N/A;  1225   COLONOSCOPY WITH PROPOFOL N/A 10/13/2022   Procedure: COLONOSCOPY WITH PROPOFOL;  Surgeon: Dolores Frame, MD;  Location: [facility];  Service: Gastroenterology;  Laterality: N/A;   ESOPHAGOGASTRODUODENOSCOPY (EGD) WITH PROPOFOL N/A 10/13/2022   Procedure: ESOPHAGOGASTRODUODENOSCOPY (EGD) WITH PROPOFOL;  Surgeon: Dolores Frame, MD;  Location: [facility];  Service: Gastroenterology;  Laterality: N/A;   ORIF TIBIA PLATEAU Right 12/12/2022   Procedure: OPEN REDUCTION INTERNAL FIXATION (ORIF) TIBIAL PLATEAU;  Surgeon: Roby Lofts, MD;  Location: [facility];  Service: Orthopedics;  Laterality: Right;   PARTIAL MASTECTOMY WITH AXILLARY SENTINEL LYMPH NODE BIOPSY Left 09/26/2023   Procedure: PARTIAL MASTECTOMY WITH AXILLARY SENTINEL LYMPH NODE BIOPSY;  Surgeon: Lucretia Roers,  MD;  Location: [facility];  Service: General;  Laterality: Left;   PERIPHERAL VASCULAR THROMBECTOMY Right 11/2022   2 DVT following surgical intervention of fracture   POLYPECTOMY  10/13/2022   Procedure: POLYPECTOMY;  Surgeon: Dolores Frame, MD;  Location: [facility];  Service: Gastroenterology;;    Social History: Social History   Socioeconomic History   Marital status: Widowed    Spouse name: Not on file   Number of children: 1   Years of education: Not on file   Highest education level: Some college, no degree  Occupational History   Occupation: Disablity    Comment: Due to back problems  Tobacco Use   Smoking status: Former    Current packs/day: 0.00    Average packs/day: 1 pack/day for 15.0 years (15.0 ttl pk-yrs)    Types: Cigarettes    Start date: 09/25/1983    Quit date: 09/24/1998    Years since quitting: 25.0   Smokeless tobacco: Never  Vaping Use   Vaping status: Never Used  Substance and  Sexual Activity   Alcohol use: No    Alcohol/week: 0.0 standard drinks of alcohol   Drug use: No   Sexual activity: Not on file  Other Topics Concern   Not on file  Social History Narrative   Not on file   Social Drivers of Health   Financial Resource Strain: Low Risk  (08/16/2023)   Overall Financial Resource Strain (CARDIA)    Difficulty of Paying Living Expenses: Not hard at all  Food Insecurity: No Food Insecurity (08/16/2023)   Hunger Vital Sign    Worried About Running Out of Food in the Last Year: Never true    Ran Out of Food in the Last Year: Never true  Transportation Needs: No Transportation Needs (08/16/2023)   PRAPARE - Administrator, Civil Service (Medical): No    Lack of Transportation (Non-Medical): No  Physical Activity: Inactive (08/16/2023)   Exercise Vital Sign    Days of Exercise per Week: 0 days    Minutes of Exercise per Session: 30 min  Stress: Stress Concern Present (08/16/2023)   [employer] of Occupational Health - Occupational Stress Questionnaire    Feeling of Stress : To some extent  Social Connections: Moderately Integrated (08/16/2023)   Social Connection and Isolation Panel [NHANES]    Frequency of Communication with Friends and Family: More than three times a week    Frequency of Social Gatherings with Friends and Family: More than three times a week    Attends Religious Services: 1 to 4 times per year    Active Member of [employer] or Organizations: No    Attends Engineer, structural: More than 4 times per year    Marital Status: Widowed  Intimate Partner Violence: Not At Risk (05/10/2023)   Humiliation, Afraid, Rape, and Kick questionnaire    Fear of Current or Ex-Partner: No    Emotionally Abused: No    Physically Abused: No    Sexually Abused: No    Family History: Family History  Problem Relation Age of Onset   Stroke Mother 69   Stroke Father 73   Colon cancer Neg Hx     Current Medications:  Current  Outpatient Medications:    acetaminophen (TYLENOL) 500 MG tablet, Take 500 mg by mouth every 6 (six) hours as needed for mild pain, moderate pain or headache., Disp: , Rfl:    calcium-vitamin D (OSCAL WITH D) 500-5 MG-MCG tablet, Take 1  tablet by mouth 2 (two) times daily with a meal., Disp: 180 tablet, Rfl: 1   Cholecalciferol (VITAMIN D) 50 MCG (2000 UT) tablet, Take 2,000 Units by mouth daily., Disp: , Rfl:    gabapentin (NEURONTIN) 100 MG capsule, Take 100 mg by mouth 3 (three) times daily., Disp: , Rfl:    Melatonin 10 MG TABS, Take 10 mg by mouth at bedtime as needed (sleep)., Disp: , Rfl:    Multiple Vitamins-Minerals (MULTIVITAMIN WITH MINERALS) tablet, Take 1 tablet by mouth daily., Disp: , Rfl:    rosuvastatin (CRESTOR) 5 MG tablet, Take 1 tablet (5 mg total) by mouth daily., Disp: 90 tablet, Rfl: 3   sertraline (ZOLOFT) 50 MG tablet, Take 50 mg by mouth at bedtime., Disp: , Rfl:    tamsulosin (FLOMAX) 0.4 MG CAPS capsule, Take 1 capsule (0.4 mg total) by mouth at bedtime., Disp: 90 capsule, Rfl: 3   Allergies: Allergies  Allergen Reactions   Asa [Aspirin] Other (See Comments)    Had GI bleed 09/2022   Eliquis [Apixaban]     Severe headaches    Oxycodone     Agitation, caused bipolar episode     REVIEW OF SYSTEMS:   Review of Systems  Constitutional:  Negative for chills, fatigue and fever.  HENT:   Negative for lump/mass, mouth sores, nosebleeds, sore throat and trouble swallowing.   Eyes:  Negative for eye problems.  Respiratory:  Negative for cough and shortness of breath.   Cardiovascular:  Negative for chest pain, leg swelling and palpitations.  Gastrointestinal:  Negative for abdominal pain, constipation, diarrhea, nausea and vomiting.  Genitourinary:  Negative for bladder incontinence, difficulty urinating, dysuria, frequency, hematuria and nocturia.   Musculoskeletal:  Negative for arthralgias, back pain, flank pain, myalgias and neck pain.       +tenderness at  surgery site  Skin:  Negative for itching and rash.  Neurological:  Negative for dizziness, headaches and numbness.  Hematological:  Does not bruise/bleed easily.  Psychiatric/Behavioral:  Negative for depression, sleep disturbance and suicidal ideas. The patient is not nervous/anxious.   All other systems reviewed and are negative.    VITALS:   Blood pressure 127/76, pulse 92, temperature 98 F (36.7 C), temperature source Oral, resp. rate 16, weight 121 lb 11.1 oz (55.2 kg), SpO2 94%.  Wt Readings from Last 3 Encounters:  10/22/23 121 lb 11.1 oz (55.2 kg)  10/22/23 121 lb (54.9 kg)  10/03/23 127 lb (57.6 kg)    Body mass index is 21.56 kg/m.  Performance status (ECOG): 1 - Symptomatic but completely ambulatory  PHYSICAL EXAM:   Physical Exam Vitals and nursing note reviewed. Exam conducted with a chaperone present.  Constitutional:      Appearance: Normal appearance.  Cardiovascular:     Rate and Rhythm: Normal rate and regular rhythm.     Pulses: Normal pulses.     Heart sounds: Normal heart sounds.  Pulmonary:     Effort: Pulmonary effort is normal.     Breath sounds: Normal breath sounds.  Abdominal:     Palpations: Abdomen is soft. There is no hepatomegaly, splenomegaly or mass.     Tenderness: There is no abdominal tenderness.  Musculoskeletal:     Right lower leg: No edema.     Left lower leg: No edema.  Lymphadenopathy:     Cervical: No cervical adenopathy.     Right cervical: No superficial, deep or posterior cervical adenopathy.    Left cervical: No superficial, deep or posterior  cervical adenopathy.     Upper Body:     Right upper body: No supraclavicular or axillary adenopathy.     Left upper body: No supraclavicular or axillary adenopathy.  Neurological:     General: No focal deficit present.     Mental Status: She is alert and oriented to person, place, and time.  Psychiatric:        Mood and Affect: Mood normal.        Behavior: Behavior normal.    Breast exam: 2 to 3 cm mass in the left breast upper outer quadrant freely mobile.  No other masses palpable.  No adenopathy palpable.  LABS:      Latest Ref Rng & Units 08/20/2023   11:22 AM 12/14/2022    3:16 AM 12/13/2022    6:20 AM  CBC  WBC 3.4 - 10.8 x10E3/uL 6.5  9.5  10.0   Hemoglobin 11.1 - 15.9 g/dL 09.8  9.6  9.6   Hematocrit 34.0 - 46.6 % 46.4  31.2  31.9   Platelets 150 - 450 x10E3/uL 228  351  329       Latest Ref Rng & Units 08/21/2023    9:29 AM 08/20/2023   11:22 AM 12/13/2022    2:22 AM  CMP  Glucose 70 - 99 mg/dL  99  119   BUN 8 - 27 mg/dL  12  21   Creatinine 1.47 - 1.00 mg/dL  8.29  5.62   Sodium 130 - 144 mmol/L  147  139   Potassium 3.5 - 5.2 mmol/L  4.7  4.3   Chloride 96 - 106 mmol/L  107  107   CO2 20 - 29 mmol/L  26  27   Calcium 8.7 - 10.3 mg/dL 9.4  9.8  8.2   Total Protein 6.0 - 8.5 g/dL  6.5    Total Bilirubin 0.0 - 1.2 mg/dL  <8.6    Alkaline Phos 44 - 121 IU/L  111    AST 0 - 40 IU/L  23    ALT 0 - 32 IU/L  7       No results found for: "CEA1", "CEA" / No results found for: "CEA1", "CEA" No results found for: "PSA1" No results found for: "VHQ469" No results found for: "CAN125"  No results found for: "TOTALPROTELP", "ALBUMINELP", "A1GS", "A2GS", "BETS", "BETA2SER", "GAMS", "MSPIKE", "SPEI" No results found for: "TIBC", "FERRITIN", "IRONPCTSAT" No results found for: "LDH"   STUDIES:   ECHOCARDIOGRAM COMPLETE Result Date: 10/22/2023    ECHOCARDIOGRAM REPORT   Patient Name:   Pamela Henson [facility] Date of Exam: 10/22/2023 Medical Rec #:  [id number]        Height:       63.0 in Accession #:    [id number]       Weight:       127.0 lb Date of Birth:  [birth year]         BSA:          1.594 m Patient Age:    72 years         BP:           136/92 mmHg Patient Gender: F                HR:           81 bpm. [facility] Procedure: 2D Echo, Cardiac Doppler, Color Doppler and 3D Echo Indications:    Chemo Z09  History:  Patient has no  prior history of Echocardiogram examinations.                 Signs/Symptoms:Syncope; Risk Factors:Former Smoker. Breast                 cancer of upper-outer quadrant of left female breast (HCC).  Sonographer:    Celesta Gentile RCS Referring Phys: [phone] Doreatha Massed IMPRESSIONS  1. Left ventricular ejection fraction, by estimation, is 60 to 65%. The left ventricle has normal function. The left ventricle has no regional wall motion abnormalities. There is moderate left ventricular hypertrophy. Left ventricular diastolic parameters are consistent with Grade I diastolic dysfunction (impaired relaxation).  2. Right ventricular systolic function is normal. The right ventricular size is normal.  3. Left atrial size was mildly dilated.  4. The mitral valve is normal in structure. No evidence of mitral valve regurgitation. No evidence of mitral stenosis.  5. The aortic valve is tricuspid. Aortic valve regurgitation is not visualized. No aortic stenosis is present.  6. The inferior vena cava is dilated in size with >50% respiratory variability, suggesting right atrial pressure of 8 mmHg. FINDINGS  Left Ventricle: Left ventricular ejection fraction, by estimation, is 60 to 65%. The left ventricle has normal function. The left ventricle has no regional wall motion abnormalities. The left ventricular internal cavity size was normal in size. There is  moderate left ventricular hypertrophy. Left ventricular diastolic parameters are consistent with Grade I diastolic dysfunction (impaired relaxation). Normal left ventricular filling pressure. Right Ventricle: The right ventricular size is normal. Right vetricular wall thickness was not well visualized. Right ventricular systolic function is normal. Left Atrium: Left atrial size was mildly dilated. Right Atrium: Right atrial size was normal in size. Pericardium: There is no evidence of pericardial effusion. Mitral Valve: The mitral valve is normal in structure. No evidence  of mitral valve regurgitation. No evidence of mitral valve stenosis. Tricuspid Valve: The tricuspid valve is normal in structure. Tricuspid valve regurgitation is not demonstrated. No evidence of tricuspid stenosis. Aortic Valve: The aortic valve is tricuspid. Aortic valve regurgitation is not visualized. No aortic stenosis is present. Aortic valve mean gradient measures 2.7 mmHg. Aortic valve peak gradient measures 5.5 mmHg. Aortic valve area, by VTI measures 2.90 cm. Pulmonic Valve: The pulmonic valve was not well visualized. Pulmonic valve regurgitation is not visualized. No evidence of pulmonic stenosis. Aorta: The aortic root and ascending aorta are structurally normal, with no evidence of dilitation. Venous: The inferior vena cava is dilated in size with greater than 50% respiratory variability, suggesting right atrial pressure of 8 mmHg. IAS/Shunts: No atrial level shunt detected by color flow Doppler.  LEFT VENTRICLE PLAX 2D LVIDd:         4.80 cm     Diastology LVIDs:         2.30 cm     LV e' medial:    4.79 cm/s LV PW:         1.30 cm     LV E/e' medial:  13.3 LV IVS:        1.40 cm     LV e' lateral:   5.55 cm/s LVOT diam:     1.90 cm     LV E/e' lateral: 11.5 LV SV:         63 LV SV Index:   39 LVOT Area:     2.84 cm  3D Volume EF: LV Volumes (MOD)           3D EF:        74 % LV vol d, MOD A2C: 47.3 ml LV EDV:       117 ml LV vol d, MOD A4C: 65.9 ml LV ESV:       30 ml LV vol s, MOD A2C: 20.4 ml LV SV:        86 ml LV vol s, MOD A4C: 29.4 ml LV SV MOD A2C:     26.9 ml LV SV MOD A4C:     65.9 ml LV SV MOD BP:      34.0 ml RIGHT VENTRICLE RV S prime:     16.20 cm/s TAPSE (M-mode): 2.0 cm LEFT ATRIUM             Index        RIGHT ATRIUM           Index LA diam:        3.70 cm 2.32 cm/m   RA Area:     13.80 cm LA Vol (A2C):   68.0 ml 42.65 ml/m  RA Volume:   33.30 ml  20.89 ml/m LA Vol (A4C):   49.4 ml 30.99 ml/m LA Biplane Vol: 59.3 ml 37.20 ml/m  AORTIC VALVE AV Area  (Vmax):    2.97 cm AV Area (Vmean):   2.70 cm AV Area (VTI):     2.90 cm AV Vmax:           117.28 cm/s AV Vmean:          75.171 cm/s AV VTI:            0.217 m AV Peak Grad:      5.5 mmHg AV Mean Grad:      2.7 mmHg LVOT Vmax:         123.00 cm/s LVOT Vmean:        71.700 cm/s LVOT VTI:          0.222 m LVOT/AV VTI ratio: 1.02  AORTA Ao Root diam: 3.20 cm Ao Asc diam:  3.00 cm MITRAL VALVE MV Area (PHT): 2.54 cm    SHUNTS MV Decel Time: 299 msec    Systemic VTI:  0.22 m MV E velocity: 63.80 cm/s  Systemic Diam: 1.90 cm MV A velocity: 73.40 cm/s MV E/A ratio:  0.87 Dina Rich MD Electronically signed by Dina Rich MD Signature Date/Time: 10/22/2023/10:10:07 AM    Final    MM BREAST SURGICAL SPECIMEN Result Date: 09/26/2023 CLINICAL DATA:  Post left lumpectomy. EXAM: SPECIMEN RADIOGRAPH OF THE LEFT BREAST COMPARISON:  Previous exam(s). FINDINGS: Status post excision of the left breast. The heart shaped biopsy marker clip is present and marked for pathology. IMPRESSION: Specimen radiograph of the left breast. Electronically Signed   By: Edwin Cap M.D.   On: 09/26/2023 11:13

## 2023-10-22 ENCOUNTER — Encounter: Payer: Self-pay | Admitting: General Surgery

## 2023-10-22 ENCOUNTER — Ambulatory Visit: Payer: Medicare Other | Admitting: General Surgery

## 2023-10-22 ENCOUNTER — Ambulatory Visit (HOSPITAL_COMMUNITY)
Admission: RE | Admit: 2023-10-22 | Discharge: 2023-10-22 | Disposition: A | Discharge status: Home / Self Care | Payer: Medicare Other | Source: Ambulatory Visit | Attending: Hematology | Admitting: Hematology

## 2023-10-22 ENCOUNTER — Inpatient Hospital Stay: Payer: Medicare Other | Attending: Hematology | Admitting: Hematology

## 2023-10-22 VITALS — BP 127/76 | HR 92 | Temp 98.0°F | Resp 16 | Wt 121.7 lb

## 2023-10-22 VITALS — BP 110/72 | HR 87 | Temp 97.8°F | Resp 14 | Ht 63.0 in | Wt 121.0 lb

## 2023-10-22 DIAGNOSIS — Z86718 Personal history of other venous thrombosis and embolism: Secondary | ICD-10-CM | POA: Diagnosis not present

## 2023-10-22 DIAGNOSIS — I517 Cardiomegaly: Secondary | ICD-10-CM | POA: Diagnosis not present

## 2023-10-22 DIAGNOSIS — Z823 Family history of stroke: Secondary | ICD-10-CM | POA: Diagnosis not present

## 2023-10-22 DIAGNOSIS — Z01818 Encounter for other preprocedural examination: Secondary | ICD-10-CM | POA: Diagnosis not present

## 2023-10-22 DIAGNOSIS — C50412 Malignant neoplasm of upper-outer quadrant of left female breast: Secondary | ICD-10-CM

## 2023-10-22 DIAGNOSIS — M810 Age-related osteoporosis without current pathological fracture: Secondary | ICD-10-CM | POA: Insufficient documentation

## 2023-10-22 DIAGNOSIS — Z0189 Encounter for other specified special examinations: Secondary | ICD-10-CM

## 2023-10-22 DIAGNOSIS — Z87891 Personal history of nicotine dependence: Secondary | ICD-10-CM | POA: Insufficient documentation

## 2023-10-22 DIAGNOSIS — Z170 Estrogen receptor positive status [ER+]: Secondary | ICD-10-CM

## 2023-10-22 DIAGNOSIS — Z8673 Personal history of transient ischemic attack (TIA), and cerebral infarction without residual deficits: Secondary | ICD-10-CM | POA: Diagnosis not present

## 2023-10-22 LAB — ECHOCARDIOGRAM COMPLETE
AR max vel: 2.97 cm2
AV Area VTI: 2.9 cm2
AV Area mean vel: 2.7 cm2
AV Mean grad: 2.7 mmHg
AV Peak grad: 5.5 mmHg
Ao pk vel: 1.17 m/s
Area-P 1/2: 2.54 cm2
Calc EF: 56.4 %
S' Lateral: 2.3 cm
Single Plane A2C EF: 56.9 %
Single Plane A4C EF: 55.4 %

## 2023-10-22 NOTE — Progress Notes (Unsigned)
[facility] History and Physical  Reason for Referral:*** Referring Physician: ***  Chief Complaint   Post-op Follow-up     Pamela Henson is a 73 y.o. female.  HPI: ***.  The *** started *** and has had a duration of ***.  It is associated with ***.  The *** is improved with ***, and is made worse with ***.    Quality*** Context***  Past Medical History:  Diagnosis Date   Anemia    Bipolar 1 disorder (HCC)    Bipolar 1 disorder (HCC)    Complication of anesthesia    Headache    History of chicken pox    History of measles    History of mumps    PONV (postoperative nausea and vomiting)    Urine retention    takes Flomax    Past Surgical History:  Procedure Laterality Date   ABDOMINAL HYSTERECTOMY  09/24/1977   age 10 due to vaginal bleeding   BREAST BIOPSY Left 08/30/2023   [place] LT BREAST BX W LOC DEV 1ST LESION IMG BX SPEC US GUIDE 08/30/2023 [facility]   COLONOSCOPY N/A 10/30/2017   Procedure: COLONOSCOPY;  Surgeon: Malissa Hippo, MD;  Location: [facility];  Service: Endoscopy;  Laterality: N/A;  1225   COLONOSCOPY WITH PROPOFOL N/A 10/13/2022   Procedure: COLONOSCOPY WITH PROPOFOL;  Surgeon: Dolores Frame, MD;  Location: [facility];  Service: Gastroenterology;  Laterality: N/A;   ESOPHAGOGASTRODUODENOSCOPY (EGD) WITH PROPOFOL N/A 10/13/2022   Procedure: ESOPHAGOGASTRODUODENOSCOPY (EGD) WITH PROPOFOL;  Surgeon: Dolores Frame, MD;  Location: [facility];  Service: Gastroenterology;  Laterality: N/A;   ORIF TIBIA PLATEAU Right 12/12/2022   Procedure: OPEN REDUCTION INTERNAL FIXATION (ORIF) TIBIAL PLATEAU;  Surgeon: Roby Lofts, MD;  Location: [facility];  Service: Orthopedics;  Laterality: Right;   PARTIAL MASTECTOMY WITH AXILLARY SENTINEL LYMPH NODE BIOPSY Left 09/26/2023   Procedure: PARTIAL MASTECTOMY WITH AXILLARY SENTINEL LYMPH NODE BIOPSY;  Surgeon: Lucretia Roers, MD;  Location: [facility];  Service:  General;  Laterality: Left;   PERIPHERAL VASCULAR THROMBECTOMY Right 11/2022   2 DVT following surgical intervention of fracture   POLYPECTOMY  10/13/2022   Procedure: POLYPECTOMY;  Surgeon: Dolores Frame, MD;  Location: [facility];  Service: Gastroenterology;;    Family History  Problem Relation Age of Onset   Stroke Mother 39   Stroke Father 71   Colon cancer Neg Hx     Social History   Tobacco Use   Smoking status: Former    Current packs/day: 0.00    Average packs/day: 1 pack/day for 15.0 years (15.0 ttl pk-yrs)    Types: Cigarettes    Start date: 09/25/1983    Quit date: 09/24/1998    Years since quitting: 25.0   Smokeless tobacco: Never  Vaping Use   Vaping status: Never Used  Substance Use Topics   Alcohol use: No    Alcohol/week: 0.0 standard drinks of alcohol   Drug use: No    Medications: {medication reviewed/display:3041432} Allergies as of 10/22/2023       Reactions   Asa [aspirin] Other (See Comments)   Had GI bleed 09/2022   Eliquis [apixaban]    Severe headaches    Oxycodone    Agitation, caused bipolar episode         Medication List        Accurate as of October 22, 2023 10:18 AM. If you have any questions, ask your nurse or doctor.  STOP taking these medications    HYDROcodone-acetaminophen 5-325 MG tablet Commonly known as: NORCO/VICODIN Stopped by: Lucretia Roers   ondansetron 4 MG tablet Commonly known as: Zofran Stopped by: Lucretia Roers       TAKE these medications    acetaminophen 500 MG tablet Commonly known as: TYLENOL Take 500 mg by mouth every 6 (six) hours as needed for mild pain, moderate pain or headache.   calcium-vitamin D 500-5 MG-MCG tablet Commonly known as: OSCAL WITH D Take 1 tablet by mouth 2 (two) times daily with a meal.   gabapentin 100 MG capsule Commonly known as: NEURONTIN Take 100 mg by mouth 3 (three) times daily.   Melatonin 10 MG Tabs Take 10 mg by mouth at  bedtime as needed (sleep).   multivitamin with minerals tablet Take 1 tablet by mouth daily.   rosuvastatin 5 MG tablet Commonly known as: Crestor Take 1 tablet (5 mg total) by mouth daily.   sertraline 50 MG tablet Commonly known as: ZOLOFT Take 50 mg by mouth at bedtime.   tamsulosin 0.4 MG Caps capsule Commonly known as: FLOMAX Take 1 capsule (0.4 mg total) by mouth at bedtime.   Vitamin D 50 MCG (2000 UT) tablet Take 2,000 Units by mouth daily.         ROS:  {Review of Systems:30496}  Blood pressure 110/72, pulse 87, temperature 97.8 F (36.6 C), temperature source Oral, resp. rate 14, height 5\' 3"  (1.6 m), weight 121 lb (54.9 kg). Physical Exam  Results: No results found for this or any previous visit (from the past 48 hours).  ECHOCARDIOGRAM COMPLETE Result Date: 10/22/2023    ECHOCARDIOGRAM REPORT   Patient Name:   Pamela Henson [facility] Date of Exam: 10/22/2023 Medical Rec #:  [id number]        Height:       63.0 in Accession #:    [id number]       Weight:       127.0 lb Date of Birth:  10-27-1950         BSA:          1.594 m Patient Age:    72 years         BP:           136/92 mmHg Patient Gender: F                HR:           81 bpm. [facility] Procedure: 2D Echo, Cardiac Doppler, Color Doppler and 3D Echo Indications:    Chemo Z09  History:        Patient has no prior history of Echocardiogram examinations.                 Signs/Symptoms:Syncope; Risk Factors:Former Smoker. Breast                 cancer of upper-outer quadrant of left female breast (HCC).  Sonographer:    Celesta Gentile RCS Referring Phys: [phone] Doreatha Massed IMPRESSIONS  1. Left ventricular ejection fraction, by estimation, is 60 to 65%. The left ventricle has normal function. The left ventricle has no regional wall motion abnormalities. There is moderate left ventricular hypertrophy. Left ventricular diastolic parameters are consistent with Grade I diastolic dysfunction (impaired  relaxation).  2. Right ventricular systolic function is normal. The right ventricular size is normal.  3. Left atrial size was mildly dilated.  4. The mitral valve is normal  in structure. No evidence of mitral valve regurgitation. No evidence of mitral stenosis.  5. The aortic valve is tricuspid. Aortic valve regurgitation is not visualized. No aortic stenosis is present.  6. The inferior vena cava is dilated in size with >50% respiratory variability, suggesting right atrial pressure of 8 mmHg. FINDINGS  Left Ventricle: Left ventricular ejection fraction, by estimation, is 60 to 65%. The left ventricle has normal function. The left ventricle has no regional wall motion abnormalities. The left ventricular internal cavity size was normal in size. There is  moderate left ventricular hypertrophy. Left ventricular diastolic parameters are consistent with Grade I diastolic dysfunction (impaired relaxation). Normal left ventricular filling pressure. Right Ventricle: The right ventricular size is normal. Right vetricular wall thickness was not well visualized. Right ventricular systolic function is normal. Left Atrium: Left atrial size was mildly dilated. Right Atrium: Right atrial size was normal in size. Pericardium: There is no evidence of pericardial effusion. Mitral Valve: The mitral valve is normal in structure. No evidence of mitral valve regurgitation. No evidence of mitral valve stenosis. Tricuspid Valve: The tricuspid valve is normal in structure. Tricuspid valve regurgitation is not demonstrated. No evidence of tricuspid stenosis. Aortic Valve: The aortic valve is tricuspid. Aortic valve regurgitation is not visualized. No aortic stenosis is present. Aortic valve mean gradient measures 2.7 mmHg. Aortic valve peak gradient measures 5.5 mmHg. Aortic valve area, by VTI measures 2.90 cm. Pulmonic Valve: The pulmonic valve was not well visualized. Pulmonic valve regurgitation is not visualized. No evidence of  pulmonic stenosis. Aorta: The aortic root and ascending aorta are structurally normal, with no evidence of dilitation. Venous: The inferior vena cava is dilated in size with greater than 50% respiratory variability, suggesting right atrial pressure of 8 mmHg. IAS/Shunts: No atrial level shunt detected by color flow Doppler.  LEFT VENTRICLE PLAX 2D LVIDd:         4.80 cm     Diastology LVIDs:         2.30 cm     LV e' medial:    4.79 cm/s LV PW:         1.30 cm     LV E/e' medial:  13.3 LV IVS:        1.40 cm     LV e' lateral:   5.55 cm/s LVOT diam:     1.90 cm     LV E/e' lateral: 11.5 LV SV:         63 LV SV Index:   39 LVOT Area:     2.84 cm                             3D Volume EF: LV Volumes (MOD)           3D EF:        74 % LV vol d, MOD A2C: 47.3 ml LV EDV:       117 ml LV vol d, MOD A4C: 65.9 ml LV ESV:       30 ml LV vol s, MOD A2C: 20.4 ml LV SV:        86 ml LV vol s, MOD A4C: 29.4 ml LV SV MOD A2C:     26.9 ml LV SV MOD A4C:     65.9 ml LV SV MOD BP:      34.0 ml RIGHT VENTRICLE RV S prime:     16.20 cm/s TAPSE (M-mode): 2.0 cm  LEFT ATRIUM             Index        RIGHT ATRIUM           Index LA diam:        3.70 cm 2.32 cm/m   RA Area:     13.80 cm LA Vol (A2C):   68.0 ml 42.65 ml/m  RA Volume:   33.30 ml  20.89 ml/m LA Vol (A4C):   49.4 ml 30.99 ml/m LA Biplane Vol: 59.3 ml 37.20 ml/m  AORTIC VALVE AV Area (Vmax):    2.97 cm AV Area (Vmean):   2.70 cm AV Area (VTI):     2.90 cm AV Vmax:           117.28 cm/s AV Vmean:          75.171 cm/s AV VTI:            0.217 m AV Peak Grad:      5.5 mmHg AV Mean Grad:      2.7 mmHg LVOT Vmax:         123.00 cm/s LVOT Vmean:        71.700 cm/s LVOT VTI:          0.222 m LVOT/AV VTI ratio: 1.02  AORTA Ao Root diam: 3.20 cm Ao Asc diam:  3.00 cm MITRAL VALVE MV Area (PHT): 2.54 cm    SHUNTS MV Decel Time: 299 msec    Systemic VTI:  0.22 m MV E velocity: 63.80 cm/s  Systemic Diam: 1.90 cm MV A velocity: 73.40 cm/s MV E/A ratio:  0.87 Dina Rich MD  Electronically signed by Dina Rich MD Signature Date/Time: 10/22/2023/10:10:07 AM    Final      Assessment & Plan:  Pamela Henson is a 73 y.o. female with *** -*** -*** -Follow up ***  All questions were answered to the satisfaction of the patient and family***.  The risk and benefits of *** were discussed including but not limited to ***.  After careful consideration, Pamela Henson has decided to ***.    Lucretia Roers 10/22/2023, 10:18 AM

## 2023-10-22 NOTE — Progress Notes (Signed)
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  3 cm Therapeutic Status: Postoperative without Neoadjuvant Therapy, M0 (Pathologic Staging) AJCC Grade: G2 AJCC N Category: pN0 AJCC M Category: cM0 ER Status: Positive (+) AJCC 8 Stage Grouping: IB HER2 Status: Positive (+) Oncotype Dx Recurrence Score: Not Appropriate AJCC T Category: pT2 PR Status: Negative (-) Intent of Therapy: Curative Intent, Discussed with Patient

## 2023-10-22 NOTE — Patient Instructions (Addendum)
[facility] at [facility] Discharge Instructions   You were seen and examined today by Dr. Ellin Saba.  He reviewed the results of your biopsy from your lumpectomy. The tumor is 2.4 cm tumor, that is estrogen receptor positive and HER-2 positive. HER-2 positive cancers have a high risk of coming back. Dr. Kirtland Bouchard recommends treatment based on the tumor size and it being HER-2 positive.   He recommends treatment with two chemo drugs and a monoclonal antibody. Chemo is given for 6 cycles, and then the monoclonal antibody is given for a total of one year. Treatments are given every 3 weeks. The names of the chemo drugs are called Taxotere and Carboplatin. The antibody is called Herceptin.   You will need a port a cath placed in order to administer the chemotherapy.   We will arrange for you to have an education session with one of our nurses to go over things in detail and answer your questions.   Return as scheduled.   Thank you for choosing [facility] at [facility] to provide your oncology and hematology care.  To afford each patient quality time with our provider, please arrive at least 15 minutes before your scheduled appointment time.   If you have a lab appointment with the Cancer Center please come in thru the Main Entrance and check in at the main information desk.  You need to re-schedule your appointment should you arrive 10 or more minutes late.  We strive to give you quality time with our providers, and arriving late affects you and other patients whose appointments are after yours.  Also, if you no show three or more times for appointments you may be dismissed from the clinic at the providers discretion.     Again, thank you for choosing [facility].  Our hope is that these requests will decrease the amount of time that you wait before being seen by our physicians.        _____________________________________________________________  Should you have questions after your visit to [facility], please contact our office at [phone] and follow the prompts.  Our office hours are 8:00 a.m. and 4:30 p.m. Monday - Friday.  Please note that voicemails left after 4:00 p.m. may not be returned until the following business day.  We are closed weekends and major holidays.  You do have access to a nurse 24-7, just call the main number to the clinic [phone] and do not press any options, hold on the line and a nurse will answer the phone.    For prescription refill requests, have your pharmacy contact our office and allow 72 hours.    Due to Covid, you will need to wear a mask upon entering the hospital. If you do not have a mask, a mask will be given to you at the Main Entrance upon arrival. For doctor visits, patients may have 1 support person age 51 or older with them. For treatment visits, patients can not have anyone with them due to social distancing guidelines and our immunocompromised population.

## 2023-10-22 NOTE — Patient Instructions (Signed)
Follow up as needed. If Dr. Ellin Saba thinks you need further treatment, we can get your scheduled for a port a catheter.

## 2023-10-22 NOTE — Progress Notes (Signed)
*  PRELIMINARY RESULTS* Echocardiogram 2D Echocardiogram has been performed.  Stacey Drain 10/22/2023, 9:33 AM

## 2023-10-23 ENCOUNTER — Other Ambulatory Visit: Payer: Self-pay

## 2023-10-23 NOTE — H&P (Signed)
[facility] History and Physical   Chief Complaint   Post-op Follow-up     Pamela Henson is a 73 y.o. female.  HPI: [name] is doing well after her left partial mastectomy and SLNB. She is doing well and does have some soreness. She is going to see Dr. Ellin Saba after this appointment and we have been notified that she will need a port placed.  Past Medical History:  Diagnosis Date   Anemia    Bipolar 1 disorder (HCC)    Bipolar 1 disorder (HCC)    Complication of anesthesia    Headache    History of chicken pox    History of measles    History of mumps    PONV (postoperative nausea and vomiting)    Urine retention    takes Flomax    Past Surgical History:  Procedure Laterality Date   ABDOMINAL HYSTERECTOMY  09/24/1977   age 40 due to vaginal bleeding   BREAST BIOPSY Left 08/30/2023   [place] LT BREAST BX W LOC DEV 1ST LESION IMG BX SPEC US GUIDE 08/30/2023 [facility]   COLONOSCOPY N/A 10/30/2017   Procedure: COLONOSCOPY;  Surgeon: Malissa Hippo, MD;  Location: [facility];  Service: Endoscopy;  Laterality: N/A;  1225   COLONOSCOPY WITH PROPOFOL N/A 10/13/2022   Procedure: COLONOSCOPY WITH PROPOFOL;  Surgeon: Dolores Frame, MD;  Location: [facility];  Service: Gastroenterology;  Laterality: N/A;   ESOPHAGOGASTRODUODENOSCOPY (EGD) WITH PROPOFOL N/A 10/13/2022   Procedure: ESOPHAGOGASTRODUODENOSCOPY (EGD) WITH PROPOFOL;  Surgeon: Dolores Frame, MD;  [facility];  Service: Gastroenterology;  Laterality: N/A;   ORIF TIBIA PLATEAU Right 12/12/2022   Procedure: OPEN REDUCTION INTERNAL FIXATION (ORIF) TIBIAL PLATEAU;  Surgeon: Roby Lofts, MD;  Location: [facility];  Service: Orthopedics;  Laterality: Right;   PARTIAL MASTECTOMY WITH AXILLARY SENTINEL LYMPH NODE BIOPSY Left 09/26/2023   Procedure: PARTIAL MASTECTOMY WITH AXILLARY SENTINEL LYMPH NODE BIOPSY;  Surgeon: Lucretia Roers, MD;  Location: [facility];   Service: General;  Laterality: Left;   PERIPHERAL VASCULAR THROMBECTOMY Right 11/2022   2 DVT following surgical intervention of fracture   POLYPECTOMY  10/13/2022   Procedure: POLYPECTOMY;  Surgeon: Dolores Frame, MD;  [facility]: [facility];  Service: Gastroenterology;;    Family History  Problem Relation Age of Onset   Stroke Mother 48   Stroke Father 101   Colon cancer Neg Hx     Social History   Tobacco Use   Smoking status: Former    Current packs/day: 0.00    Average packs/day: 1 pack/day for 15.0 years (15.0 ttl pk-yrs)    Types: Cigarettes    Start date: 09/25/1983    Quit date: 09/24/1998    Years since quitting: 25.0   Smokeless tobacco: Never  Vaping Use   Vaping status: Never Used  Substance Use Topics   Alcohol use: No    Alcohol/week: 0.0 standard drinks of alcohol   Drug use: No    Medications: I have reviewed the patient's current medications. Allergies as of 10/22/2023       Reactions   Asa [aspirin] Other (See Comments)   Had GI bleed 09/2022   Eliquis [apixaban]    Severe headaches    Oxycodone    Agitation, caused bipolar episode         Medication List        Accurate as of October 22, 2023 10:18 AM. If you have any questions,  STOP taking these medications    HYDROcodone-acetaminophen 5-325 MG tablet Commonly known as: NORCO/VICODIN Stopped by: Lucretia Roers   ondansetron 4 MG tablet Commonly known as: Zofran Stopped by: Lucretia Roers       TAKE these medications    acetaminophen 500 MG tablet Commonly known as: TYLENOL Take 500 mg by mouth every 6 (six) hours as needed for mild pain, moderate pain or headache.   calcium-vitamin D 500-5 MG-MCG tablet Commonly known as: OSCAL WITH D Take 1 tablet by mouth 2 (two) times daily with a meal.   gabapentin 100 MG capsule Commonly known as: NEURONTIN Take 100 mg by mouth 3 (three) times daily.   Melatonin 10 MG Tabs Take 10 mg by mouth at  bedtime as needed (sleep).   multivitamin with minerals tablet Take 1 tablet by mouth daily.   rosuvastatin 5 MG tablet Commonly known as: Crestor Take 1 tablet (5 mg total) by mouth daily.   sertraline 50 MG tablet Commonly known as: ZOLOFT Take 50 mg by mouth at bedtime.   tamsulosin 0.4 MG Caps capsule Commonly known as: FLOMAX Take 1 capsule (0.4 mg total) by mouth at bedtime.   Vitamin D 50 MCG (2000 UT) tablet Take 2,000 Units by mouth daily.         ROS:  {Review of Systems:30496}  Blood pressure 110/72, pulse 87, temperature 97.8 F (36.6 C), temperature source Oral, resp. rate 14, height 5\' 3"  (1.6 m), weight 121 lb (54.9 kg). Physical Exam  Results: No results found for this or any previous visit (from the past 48 hours).  ECHOCARDIOGRAM COMPLETE Result Date: 10/22/2023    ECHOCARDIOGRAM REPORT   Patient Name:   Pamela Henson [facility] Date of Exam: 10/22/2023 Medical Rec #:  [id number]        Height:       63.0 in Accession #:    [id number]       Weight:       127.0 lb Date of Birth:  10-27-1950         BSA:          1.594 m Patient Age:    72 years         BP:           136/92 mmHg Patient Gender: F                HR:           81 bpm. [facility] Procedure: 2D Echo, Cardiac Doppler, Color Doppler and 3D Echo Indications:    Chemo Z09  History:        Patient has no prior history of Echocardiogram examinations.                 Signs/Symptoms:Syncope; Risk Factors:Former Smoker. Breast                 cancer of upper-outer quadrant of left female breast (HCC).  Sonographer:    Celesta Gentile RCS Referring Phys: [phone] Doreatha Massed IMPRESSIONS  1. Left ventricular ejection fraction, by estimation, is 60 to 65%. The left ventricle has normal function. The left ventricle has no regional wall motion abnormalities. There is moderate left ventricular hypertrophy. Left ventricular diastolic parameters are consistent with Grade I diastolic dysfunction (impaired  relaxation).  2. Right ventricular systolic function is normal. The right ventricular size is normal.  3. Left atrial size was mildly dilated.  4. The mitral valve is normal  in structure. No evidence of mitral valve regurgitation. No evidence of mitral stenosis.  5. The aortic valve is tricuspid. Aortic valve regurgitation is not visualized. No aortic stenosis is present.  6. The inferior vena cava is dilated in size with >50% respiratory variability, suggesting right atrial pressure of 8 mmHg. FINDINGS  Left Ventricle: Left ventricular ejection fraction, by estimation, is 60 to 65%. The left ventricle has normal function. The left ventricle has no regional wall motion abnormalities. The left ventricular internal cavity size was normal in size. There is  moderate left ventricular hypertrophy. Left ventricular diastolic parameters are consistent with Grade I diastolic dysfunction (impaired relaxation). Normal left ventricular filling pressure. Right Ventricle: The right ventricular size is normal. Right vetricular wall thickness was not well visualized. Right ventricular systolic function is normal. Left Atrium: Left atrial size was mildly dilated. Right Atrium: Right atrial size was normal in size. Pericardium: There is no evidence of pericardial effusion. Mitral Valve: The mitral valve is normal in structure. No evidence of mitral valve regurgitation. No evidence of mitral valve stenosis. Tricuspid Valve: The tricuspid valve is normal in structure. Tricuspid valve regurgitation is not demonstrated. No evidence of tricuspid stenosis. Aortic Valve: The aortic valve is tricuspid. Aortic valve regurgitation is not visualized. No aortic stenosis is present. Aortic valve mean gradient measures 2.7 mmHg. Aortic valve peak gradient measures 5.5 mmHg. Aortic valve area, by VTI measures 2.90 cm. Pulmonic Valve: The pulmonic valve was not well visualized. Pulmonic valve regurgitation is not visualized. No evidence of  pulmonic stenosis. Aorta: The aortic root and ascending aorta are structurally normal, with no evidence of dilitation. Venous: The inferior vena cava is dilated in size with greater than 50% respiratory variability, suggesting right atrial pressure of 8 mmHg. IAS/Shunts: No atrial level shunt detected by color flow Doppler.  LEFT VENTRICLE PLAX 2D LVIDd:         4.80 cm     Diastology LVIDs:         2.30 cm     LV e' medial:    4.79 cm/s LV PW:         1.30 cm     LV E/e' medial:  13.3 LV IVS:        1.40 cm     LV e' lateral:   5.55 cm/s LVOT diam:     1.90 cm     LV E/e' lateral: 11.5 LV SV:         63 LV SV Index:   39 LVOT Area:     2.84 cm                             3D Volume EF: LV Volumes (MOD)           3D EF:        74 % LV vol d, MOD A2C: 47.3 ml LV EDV:       117 ml LV vol d, MOD A4C: 65.9 ml LV ESV:       30 ml LV vol s, MOD A2C: 20.4 ml LV SV:        86 ml LV vol s, MOD A4C: 29.4 ml LV SV MOD A2C:     26.9 ml LV SV MOD A4C:     65.9 ml LV SV MOD BP:      34.0 ml RIGHT VENTRICLE RV S prime:     16.20 cm/s TAPSE (M-mode): 2.0 cm  lateral:   5.55 cm/s LVOT diam:     1.90 cm     LV E/e' lateral: 11.5 LV SV:         63 LV SV Index:   39 LVOT Area:     2.84 cm                             3D Volume EF: LV Volumes (MOD)           3D EF:        74 % LV vol d, MOD A2C: 47.3 ml LV EDV:       117 ml LV vol d, MOD A4C: 65.9 ml LV ESV:       30 ml LV vol s, MOD A2C: 20.4 ml LV SV:        86 ml LV vol s, MOD A4C: 29.4 ml LV SV MOD A2C:     26.9 ml LV SV MOD A4C:     65.9 ml LV SV MOD BP:      34.0 ml RIGHT VENTRICLE RV  S prime:     16.20 cm/s TAPSE (M-mode): 2.0 cm LEFT ATRIUM             Index        RIGHT ATRIUM           Index LA diam:        3.70 cm 2.32 cm/m   RA Area:     13.80 cm LA Vol (A2C):   68.0 ml 42.65 ml/m  RA Volume:   33.30 ml  20.89 ml/m LA Vol (A4C):   49.4 ml 30.99 ml/m LA Biplane Vol: 59.3 ml 37.20 ml/m  AORTIC VALVE AV Area (Vmax):    2.97 cm AV Area (Vmean):   2.70 cm AV Area (VTI):     2.90 cm AV Vmax:           117.28 cm/s AV Vmean:          75.171 cm/s AV VTI:            0.217 m AV Peak Grad:      5.5 mmHg AV Mean Grad:      2.7 mmHg LVOT Vmax:         123.00 cm/s LVOT Vmean:        71.700 cm/s LVOT VTI:          0.222 m LVOT/AV VTI ratio: 1.02  AORTA Ao Root diam: 3.20 cm Ao Asc diam:  3.00 cm MITRAL VALVE MV Area (PHT): 2.54 cm    SHUNTS MV Decel Time: 299 msec    Systemic VTI:  0.22 m MV E velocity: 63.80 cm/s  Systemic Diam: 1.90 cm MV A velocity: 73.40 cm/s MV E/A ratio:  0.87 Dina Rich MD Electronically signed by Dina Rich MD Signature Date/Time: 10/22/2023/10:10:07 AM    Final      Assessment & Plan:  Tennille Montelongo is a 73 y.o. female with left breast cancer that will need a port a catheter placement. Discussed risk of bleeding, infection, injury to vessels, pneumothorax.    All questions were answered to the satisfaction of the patient.   Lucretia Roers 10/22/2023, 10:18 AM

## 2023-10-23 NOTE — Patient Instructions (Incomplete)
[name] Kitchen

## 2023-10-24 MED ORDER — PROCHLORPERAZINE MALEATE 10 MG PO TABS: 10.0000 mg | ORAL_TABLET | Freq: Four times a day (QID) | ORAL | 3 refills | Status: DC | PRN

## 2023-10-24 MED ORDER — LIDOCAINE-PRILOCAINE 2.5-2.5 % EX CREA: TOPICAL_CREAM | CUTANEOUS | 3 refills | Status: AC

## 2023-10-28 ENCOUNTER — Inpatient Hospital Stay: Payer: Medicare Other | Attending: Hematology

## 2023-10-28 ENCOUNTER — Other Ambulatory Visit: Payer: Self-pay

## 2023-10-28 ENCOUNTER — Encounter (HOSPITAL_COMMUNITY)
Admission: RE | Admit: 2023-10-28 | Discharge: 2023-10-28 | Disposition: A | Discharge status: Home / Self Care | Payer: Medicare Other | Source: Ambulatory Visit | Attending: General Surgery | Admitting: General Surgery

## 2023-10-28 ENCOUNTER — Encounter (HOSPITAL_COMMUNITY): Payer: Self-pay

## 2023-10-28 DIAGNOSIS — C50412 Malignant neoplasm of upper-outer quadrant of left female breast: Secondary | ICD-10-CM | POA: Insufficient documentation

## 2023-10-28 DIAGNOSIS — Z87891 Personal history of nicotine dependence: Secondary | ICD-10-CM | POA: Insufficient documentation

## 2023-10-28 DIAGNOSIS — Z1731 Human epidermal growth factor receptor 2 positive status: Secondary | ICD-10-CM | POA: Insufficient documentation

## 2023-10-28 DIAGNOSIS — M810 Age-related osteoporosis without current pathological fracture: Secondary | ICD-10-CM | POA: Insufficient documentation

## 2023-10-28 DIAGNOSIS — Z8673 Personal history of transient ischemic attack (TIA), and cerebral infarction without residual deficits: Secondary | ICD-10-CM | POA: Insufficient documentation

## 2023-10-28 DIAGNOSIS — Z170 Estrogen receptor positive status [ER+]: Secondary | ICD-10-CM | POA: Insufficient documentation

## 2023-10-28 DIAGNOSIS — Z5112 Encounter for antineoplastic immunotherapy: Secondary | ICD-10-CM | POA: Insufficient documentation

## 2023-10-28 DIAGNOSIS — Z86718 Personal history of other venous thrombosis and embolism: Secondary | ICD-10-CM | POA: Insufficient documentation

## 2023-10-28 NOTE — Pre-Procedure Instructions (Signed)
PAT visit completed with patient over the phone. Pt instructed to shower morning of surgery and to use antibacterial soap ,answered all questions at this time.

## 2023-10-28 NOTE — Progress Notes (Signed)

## 2023-10-30 ENCOUNTER — Inpatient Hospital Stay: Payer: Medicare Other | Admitting: Licensed Clinical Social Worker

## 2023-10-30 DIAGNOSIS — C50412 Malignant neoplasm of upper-outer quadrant of left female breast: Secondary | ICD-10-CM

## 2023-10-30 NOTE — Progress Notes (Signed)
 [facility] Clinical Social Work  Initial Assessment   Lylith Bebeau is a 73 y.o. year old female contacted by phone. Clinical Social Work was referred by medical provider for assessment of psychosocial needs.   SDOH (Social Determinants of Health) assessments performed: Yes SDOH Interventions    Flowsheet Row Clinical Support from 05/10/2023 in [facility] Telephone from 10/17/2022 in [facility]  SDOH Interventions    Food Insecurity Interventions Intervention Not Indicated Intervention Not Indicated  Housing Interventions Intervention Not Indicated Intervention Not Indicated  Transportation Interventions Intervention Not Indicated Intervention Not Indicated  Utilities Interventions Intervention Not Indicated --  Alcohol Usage Interventions Intervention Not Indicated (Score <7) --  Depression Interventions/Treatment  PHQ2-9 Score <4 Follow-up Not Indicated --  Financial Strain Interventions Intervention Not Indicated --  Physical Activity Interventions Intervention Not Indicated --  Stress Interventions Intervention Not Indicated --  Social Connections Interventions Intervention Not Indicated --       SDOH Screenings   Food Insecurity: No Food Insecurity (08/16/2023)  Housing: Low Risk  (08/16/2023)  Transportation Needs: No Transportation Needs (08/16/2023)  Utilities: Not At Risk (05/10/2023)  Alcohol Screen: Low Risk  (05/10/2023)  Depression (PHQ2-9): Low Risk  (10/16/2023)  Financial Resource Strain: Low Risk  (08/16/2023)  Physical Activity: Inactive (08/16/2023)  Social Connections: Moderately Integrated (08/16/2023)  Stress: Stress Concern Present (08/16/2023)  Tobacco Use: Medium Risk (10/28/2023)  Health Literacy: Patient Declined (05/10/2023)     Distress Screen completed: No     No data to display            Family/Social Information:  Housing Arrangement: patient lives alone/ pt is independent  in ADLs Family members/support persons in your life? Pt states she has neighbors who offer a great deal of support as well as family who are available to assist if needed Transportation concerns: no, pt states she will be driving herself to and from appointments if she is able.  Employment: Retired .  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 yes Concerns about diagnosis and/or treatment: Overwhelmed by information Patient reported stressors: Anxiety/ nervousness and Adjusting to my illness Hopes and/or priorities: Pt's priority is to start chemotherapy w/ the hope of positive results Patient enjoys  not discussed Current coping skills/ strengths: Capable of independent living , Motivation for treatment/growth , Physical Health , and Supportive family/friends     SUMMARY: Current SDOH Barriers:  No barriers identified at this time  Clinical Social Work Clinical Goal(s):  No clinical social work goals at this time  Interventions: Discussed common feeling and emotions when being diagnosed with cancer, and the importance of support during treatment Informed patient of the support team roles and support services at [facility] Provided CSW contact information and encouraged patient to call with any questions or concerns Referred patient to Wadie Rung for additional support.   Follow Up Plan: Patient will contact CSW with any support or resource needs Patient verbalizes understanding of plan: Yes    Devere JONELLE Manna, LCSW Clinical Social Worker [facility]

## 2023-11-01 ENCOUNTER — Encounter (HOSPITAL_COMMUNITY): Payer: Self-pay | Admitting: General Surgery

## 2023-11-01 ENCOUNTER — Ambulatory Visit (HOSPITAL_COMMUNITY): Payer: Medicare Other

## 2023-11-01 ENCOUNTER — Other Ambulatory Visit: Payer: Self-pay

## 2023-11-01 ENCOUNTER — Encounter (HOSPITAL_COMMUNITY)
Admission: RE | Disposition: A | Discharge status: Home / Self Care | Payer: Self-pay | Source: Home / Self Care | Attending: General Surgery

## 2023-11-01 ENCOUNTER — Ambulatory Visit (HOSPITAL_COMMUNITY): Payer: Medicare Other | Admitting: Anesthesiology

## 2023-11-01 ENCOUNTER — Ambulatory Visit (HOSPITAL_COMMUNITY)
Admission: RE | Admit: 2023-11-01 | Discharge: 2023-11-01 | Disposition: A | Discharge status: Home / Self Care | Payer: Medicare Other | Attending: General Surgery | Admitting: General Surgery

## 2023-11-01 DIAGNOSIS — Z86718 Personal history of other venous thrombosis and embolism: Secondary | ICD-10-CM | POA: Insufficient documentation

## 2023-11-01 DIAGNOSIS — Z87891 Personal history of nicotine dependence: Secondary | ICD-10-CM | POA: Diagnosis not present

## 2023-11-01 DIAGNOSIS — I10 Essential (primary) hypertension: Secondary | ICD-10-CM

## 2023-11-01 DIAGNOSIS — F319 Bipolar disorder, unspecified: Secondary | ICD-10-CM | POA: Diagnosis not present

## 2023-11-01 DIAGNOSIS — D649 Anemia, unspecified: Secondary | ICD-10-CM | POA: Insufficient documentation

## 2023-11-01 DIAGNOSIS — Z170 Estrogen receptor positive status [ER+]: Secondary | ICD-10-CM | POA: Diagnosis not present

## 2023-11-01 DIAGNOSIS — C50412 Malignant neoplasm of upper-outer quadrant of left female breast: Secondary | ICD-10-CM | POA: Diagnosis not present

## 2023-11-01 DIAGNOSIS — C50912 Malignant neoplasm of unspecified site of left female breast: Secondary | ICD-10-CM | POA: Diagnosis not present

## 2023-11-01 DIAGNOSIS — R519 Headache, unspecified: Secondary | ICD-10-CM | POA: Insufficient documentation

## 2023-11-01 DIAGNOSIS — Z452 Encounter for adjustment and management of vascular access device: Secondary | ICD-10-CM | POA: Diagnosis not present

## 2023-11-01 DIAGNOSIS — M1990 Unspecified osteoarthritis, unspecified site: Secondary | ICD-10-CM | POA: Diagnosis not present

## 2023-11-01 HISTORY — PX: PORTACATH PLACEMENT: SHX2246

## 2023-11-01 SURGERY — INSERTION, TUNNELED CENTRAL VENOUS DEVICE, WITH PORT
Anesthesia: General | Site: Chest | Laterality: Right

## 2023-11-01 MED ORDER — CHLORHEXIDINE GLUCONATE CLOTH 2 % EX PADS: 6.0000 | MEDICATED_PAD | Freq: Once | CUTANEOUS | Status: DC

## 2023-11-01 MED ORDER — SODIUM CHLORIDE 0.9% FLUSH: 3.0000 mL | Freq: Two times a day (BID) | INTRAVENOUS | Status: DC

## 2023-11-01 MED ORDER — SODIUM CHLORIDE 0.9% FLUSH: 3.0000 mL | INTRAVENOUS | Status: DC | PRN

## 2023-11-01 MED ORDER — SODIUM CHLORIDE 0.9 % IV SOLN
12.5000 mg | INTRAVENOUS | Status: DC | PRN
  Filled 2023-11-01: qty 0.5

## 2023-11-01 MED ORDER — FENTANYL CITRATE PF 50 MCG/ML IJ SOSY: 25.0000 ug | PREFILLED_SYRINGE | INTRAMUSCULAR | Status: DC | PRN

## 2023-11-01 MED ORDER — SODIUM CHLORIDE (PF) 0.9 % IJ SOLN
INTRAMUSCULAR | Status: DC | PRN
  Administered 2023-11-01: 500 mL via INTRAVENOUS

## 2023-11-01 MED ORDER — ORAL CARE MOUTH RINSE: 15.0000 mL | Freq: Once | OROMUCOSAL | Status: AC

## 2023-11-01 MED ORDER — CEFAZOLIN SODIUM-DEXTROSE 2-4 GM/100ML-% IV SOLN
INTRAVENOUS | Status: AC
  Filled 2023-11-01: qty 100

## 2023-11-01 MED ORDER — LACTATED RINGERS IV SOLN: INTRAVENOUS | Status: DC | PRN

## 2023-11-01 MED ORDER — PROPOFOL 500 MG/50ML IV EMUL
INTRAVENOUS | Status: DC | PRN
  Administered 2023-11-01: 75 ug/kg/min via INTRAVENOUS

## 2023-11-01 MED ORDER — LIDOCAINE HCL (PF) 1 % IJ SOLN
INTRAMUSCULAR | Status: AC
  Filled 2023-11-01: qty 30

## 2023-11-01 MED ORDER — MIDAZOLAM HCL 2 MG/2ML IJ SOLN
INTRAMUSCULAR | Status: AC
  Filled 2023-11-01: qty 2

## 2023-11-01 MED ORDER — CHLORHEXIDINE GLUCONATE 0.12 % MT SOLN
15.0000 mL | Freq: Once | OROMUCOSAL | Status: AC
Start: 2023-11-01 — End: 2023-11-01
  Administered 2023-11-01: 15 mL via OROMUCOSAL

## 2023-11-01 MED ORDER — FENTANYL CITRATE (PF) 100 MCG/2ML IJ SOLN
INTRAMUSCULAR | Status: AC
  Filled 2023-11-01: qty 2

## 2023-11-01 MED ORDER — MIDAZOLAM HCL 5 MG/5ML IJ SOLN
INTRAMUSCULAR | Status: DC | PRN
  Administered 2023-11-01: 1 mg via INTRAVENOUS

## 2023-11-01 MED ORDER — SODIUM CHLORIDE (PF) 0.9 % IJ SOLN
INTRAMUSCULAR | Status: AC
Start: 2023-11-01 — End: ?
  Filled 2023-11-01: qty 10

## 2023-11-01 MED ORDER — HEPARIN SOD (PORK) LOCK FLUSH 100 UNIT/ML IV SOLN
INTRAVENOUS | Status: DC | PRN
  Administered 2023-11-01: 500 [IU] via INTRAVENOUS

## 2023-11-01 MED ORDER — FENTANYL CITRATE (PF) 100 MCG/2ML IJ SOLN
INTRAMUSCULAR | Status: DC | PRN
  Administered 2023-11-01: 25 ug via INTRAVENOUS

## 2023-11-01 MED ORDER — CEFAZOLIN SODIUM-DEXTROSE 2-4 GM/100ML-% IV SOLN
2.0000 g | INTRAVENOUS | Status: AC
  Administered 2023-11-01: 2 g via INTRAVENOUS

## 2023-11-01 MED ORDER — TRAMADOL HCL 50 MG PO TABS: 50.0000 mg | ORAL_TABLET | Freq: Four times a day (QID) | ORAL | 0 refills | Status: DC | PRN

## 2023-11-01 MED ORDER — HEPARIN SOD (PORK) LOCK FLUSH 100 UNIT/ML IV SOLN
INTRAVENOUS | Status: AC
Start: 2023-11-01 — End: ?
  Filled 2023-11-01: qty 5

## 2023-11-01 MED ORDER — LIDOCAINE HCL (PF) 1 % IJ SOLN
INTRAMUSCULAR | Status: DC | PRN
  Administered 2023-11-01: 15 mL

## 2023-11-01 SURGICAL SUPPLY — 24 items
APPLICATOR CHLORAPREP 10.5 ORG (MISCELLANEOUS) ×1 IMPLANT
BAG DECANTER FOR FLEXI CONT (MISCELLANEOUS) ×1 IMPLANT
COVER LIGHT HANDLE STERIS (MISCELLANEOUS) ×2 IMPLANT
DECANTER SPIKE VIAL GLASS SM (MISCELLANEOUS) ×1 IMPLANT
DERMABOND ADVANCED .7 DNX12 (GAUZE/BANDAGES/DRESSINGS) ×1 IMPLANT
DRAPE C-ARM FOLDED MOBILE STRL (DRAPES) ×1 IMPLANT
GLOVE BIO SURGEON STRL SZ 6.5 (GLOVE) ×1 IMPLANT
GLOVE BIOGEL PI IND STRL 6.5 (GLOVE) ×1 IMPLANT
GLOVE BIOGEL PI IND STRL 7.0 (GLOVE) ×2 IMPLANT
GOWN STRL REUS W/TWL LRG LVL3 (GOWN DISPOSABLE) ×2 IMPLANT
IV NS 500ML BAXH (IV SOLUTION) ×1 IMPLANT
KIT PORT INFUSION SMART 8FR (Port) IMPLANT
KIT TURNOVER KIT A (KITS) ×1 IMPLANT
MANIFOLD NEPTUNE II (INSTRUMENTS) ×1 IMPLANT
NDL HYPO 25X1 1.5 SAFETY (NEEDLE) ×1 IMPLANT
NEEDLE HYPO 25X1 1.5 SAFETY (NEEDLE) ×1
PACK MINOR (CUSTOM PROCEDURE TRAY) ×1 IMPLANT
PAD ARMBOARD 7.5X6 YLW CONV (MISCELLANEOUS) ×1 IMPLANT
POSITIONER HEAD 8X9X4 ADT (SOFTGOODS) ×1 IMPLANT
SET BASIN LINEN APH (SET/KITS/TRAYS/PACK) ×1 IMPLANT
SUT MNCRL AB 4-0 PS2 18 (SUTURE) ×1 IMPLANT
SUT PROLENE 2 0 SH 30 (SUTURE) ×1 IMPLANT
SUT VIC AB 3-0 SH 27X BRD (SUTURE) ×1 IMPLANT
SYR 10ML LL (SYRINGE) ×2 IMPLANT

## 2023-11-01 NOTE — Anesthesia Postprocedure Evaluation (Signed)
 Anesthesia Post Note  Patient: Pamela Henson  Procedure(s) Performed: INSERTION PORT-A-CATH (Right: Chest)  Patient location during evaluation: PACU Anesthesia Type: General Level of consciousness: awake and alert Pain management: pain level controlled Vital Signs Assessment: post-procedure vital signs reviewed and stable Respiratory status: spontaneous breathing, nonlabored ventilation, respiratory function stable and patient connected to nasal cannula oxygen  Cardiovascular status: blood pressure returned to baseline and stable Postop Assessment: no apparent nausea or vomiting Anesthetic complications: no   There were no known notable events for this encounter.   Last Vitals:  Vitals:   11/01/23 1230 11/01/23 1250  BP: (!) 143/80 (!) 155/92  Pulse: 66 74  Resp: 19 18  Temp:  36.5 C  SpO2: 93% 96%    Last Pain:  Vitals:   11/01/23 1250  TempSrc: Oral  PainSc: 1                  Astryd Pearcy L Aylanie Cubillos

## 2023-11-01 NOTE — Interval H&P Note (Signed)
 History and Physical Interval Note:  11/01/2023 10:41 AM  Pamela Henson  has presented today for surgery, with the diagnosis of Malignant neoplasm of upper-outer quadrant of left breast in female, estrogen receptor positive.  The various methods of treatment have been discussed with the patient and family. After consideration of risks, benefits and other options for treatment, the patient has consented to  Procedure(s): INSERTION PORT-A-CATH (Right) as a surgical intervention.  The patient's history has been reviewed, patient examined, no change in status, stable for surgery.  I have reviewed the patient's chart and labs.  Questions were answered to the patient's satisfaction.     [name]

## 2023-11-01 NOTE — Op Note (Signed)
 Operative Note 11/01/23   Preoperative Diagnosis: Left breast cancer    Postoperative Diagnosis: Same   Procedure(s) Performed: Port-A-Cath placement, right internal jugular    Surgeon: Manuelita BROCKS. Kallie, MD   Assistants: No qualified resident was available   Anesthesia: Monitored anesthesia care   Anesthesiologist: Dr. Landry   Specimens: None   Estimated Blood Loss: Minimal   Fluoroscopy time: 15 seconds   Blood Replacement: None    Complications: None    Operative Findings: Unable to thread wire from subclavian into atriocaval junction   Indications: Pamela Henson is a 73 yo with breast cancer and she needs a port placed. We discussed port placement and risk of bleeding, infection, pneumothorax, injury to vessels and malfunction. She opted to proceed.   Procedure: The patient was brought into the operating room and monitored anesthesia was induced.  One percent lidocaine  was used for local anesthesia.   The right chest and neck was prepped and draped in the usual sterile fashion.  Preoperative antibiotics were given.  An incision was made below the right clavicle. A subcutaneous pocket was formed. The needles advanced into the subclavian vein using the Seldinger technique but I could not get the wire to thread past the midline. I tried to reposition the needle and wire and her arm.  I then went to the internal jugular and under ultrasound guidance, I advanced the needle into the jugular vein. A guidewire was then advanced into the right atrium under fluoroscopic guidance.  Ectopia was noted and the wire was pulled back. The tunneler was used to tunnel the catheter from the chest wall to the neck.  An introducer and peel-away sheath were placed over the guidewire. The catheter was then inserted through the peel-away sheath and the peel-away sheath was removed.  A spot film was performed to confirm the position. The catheter was then attached to the port and the port placed in  subcutaneous pocket. Adequate positioning was confirmed by fluoroscopy. Hemostasis was confirmed, and the port was secured with 2-0 prolene sutures.  Good backflow of blood was noted on aspiration of the port. The port was flushed with heparin  flush. Subcutaneous layer was reapproximated using a 3-0 Vicryl interrupted suture. The skin was closed using a 4-0 Vicryl subcuticular suture. Dermabond was applied.    All tape and needle counts were correct at the end of the procedure. The patient was transferred to PACU in stable condition. A chest x-ray will be performed at that time.  Manuelita Kallie, MD [facility] [address] [place], [place] [address] [phone] (office)

## 2023-11-01 NOTE — Discharge Instructions (Signed)
Keep area clean and dry. You can take a shower in 24 hours. Do not submerge in water.  Take tylenol and ibuprofen for pain control and tramadol for severe pain.  

## 2023-11-01 NOTE — Transfer of Care (Signed)
 Immediate Anesthesia Transfer of Care Note  Patient: Pamela Henson  Procedure(s) Performed: INSERTION PORT-A-CATH (Right: Chest)  Patient Location: PACU  Anesthesia Type:General  Level of Consciousness: awake  Airway & Oxygen  Therapy: Patient Spontanous Breathing and Patient connected to nasal cannula oxygen   Post-op Assessment: Report given to RN and Post -op Vital signs reviewed and stable  Post vital signs: Reviewed and stable  Last Vitals:  Vitals Value Taken Time  BP 128/78 11/01/23 1154  Temp 36.6 C 11/01/23 1154  Pulse 78 11/01/23 1156  Resp 14 11/01/23 1156  SpO2 98 % 11/01/23 1156  Vitals shown include unfiled device data.  Last Pain:  Vitals:   11/01/23 0945  TempSrc: Oral  PainSc: 0-No pain         Complications: No notable events documented.

## 2023-11-01 NOTE — Anesthesia Preprocedure Evaluation (Addendum)
 Anesthesia Evaluation  Patient identified by MRN, date of birth, ID band Patient awake    Reviewed: Allergy  & Precautions, H&P , NPO status , Patient's Chart, lab work & pertinent test results, reviewed documented beta blocker date and time   History of Anesthesia Complications (+) PONV and history of anesthetic complications  Airway Mallampati: II  TM Distance: >3 FB Neck ROM: full    Dental  (+) Lower Dentures, Upper Dentures   Pulmonary former smoker   Pulmonary exam normal breath sounds clear to auscultation       Cardiovascular Exercise Tolerance: Good hypertension, Normal cardiovascular exam Rhythm:regular Rate:Normal     Neuro/Psych  Headaches PSYCHIATRIC DISORDERS   Bipolar Disorder   CVA    GI/Hepatic negative GI ROS, Neg liver ROS,,,  Endo/Other  negative endocrine ROS    Renal/GU negative Renal ROS  negative genitourinary   Musculoskeletal  (+) Arthritis , Osteoarthritis,    Abdominal   Peds  Hematology  (+) Blood dyscrasia, anemia   Anesthesia Other Findings   Reproductive/Obstetrics negative OB ROS                             Anesthesia Physical Anesthesia Plan  ASA: 3  Anesthesia Plan: General   Post-op Pain Management:    Induction: Intravenous  PONV Risk Score and Plan: Propofol  infusion  Airway Management Planned: Nasal Cannula and Natural Airway  Additional Equipment: None  Intra-op Plan:   Post-operative Plan:   Informed Consent: I have reviewed the patients History and Physical, chart, labs and discussed the procedure including the risks, benefits and alternatives for the proposed anesthesia with the patient or authorized representative who has indicated his/her understanding and acceptance.     Dental Advisory Given  Plan Discussed with: CRNA  Anesthesia Plan Comments:        Anesthesia Quick Evaluation

## 2023-11-01 NOTE — Progress Notes (Signed)
 [facility]  Updated her friends/family. CXR ordered.   Deena Farrier, MD [facility] [address] [place], [place] [address] [phone] (office)

## 2023-11-01 NOTE — Progress Notes (Signed)
 [facility] looks in good position. No ptx.  Deena Farrier, MD [facility] [address] [place], [place] [address] [phone] (office)

## 2023-11-04 ENCOUNTER — Inpatient Hospital Stay: Payer: Medicare Other

## 2023-11-04 ENCOUNTER — Encounter (HOSPITAL_COMMUNITY): Payer: Self-pay | Admitting: General Surgery

## 2023-11-04 ENCOUNTER — Inpatient Hospital Stay: Payer: Medicare Other | Admitting: Dietician

## 2023-11-04 VITALS — BP 140/74 | HR 76 | Temp 98.2°F | Resp 16

## 2023-11-04 DIAGNOSIS — Z1731 Human epidermal growth factor receptor 2 positive status: Secondary | ICD-10-CM | POA: Diagnosis not present

## 2023-11-04 DIAGNOSIS — Z170 Estrogen receptor positive status [ER+]: Secondary | ICD-10-CM

## 2023-11-04 DIAGNOSIS — Z8673 Personal history of transient ischemic attack (TIA), and cerebral infarction without residual deficits: Secondary | ICD-10-CM | POA: Diagnosis not present

## 2023-11-04 DIAGNOSIS — Z86718 Personal history of other venous thrombosis and embolism: Secondary | ICD-10-CM | POA: Diagnosis not present

## 2023-11-04 DIAGNOSIS — C50412 Malignant neoplasm of upper-outer quadrant of left female breast: Secondary | ICD-10-CM | POA: Diagnosis not present

## 2023-11-04 DIAGNOSIS — Z87891 Personal history of nicotine dependence: Secondary | ICD-10-CM | POA: Diagnosis not present

## 2023-11-04 DIAGNOSIS — M810 Age-related osteoporosis without current pathological fracture: Secondary | ICD-10-CM | POA: Diagnosis not present

## 2023-11-04 DIAGNOSIS — Z5112 Encounter for antineoplastic immunotherapy: Secondary | ICD-10-CM | POA: Diagnosis not present

## 2023-11-04 LAB — CBC WITH DIFFERENTIAL/PLATELET
Abs Immature Granulocytes: 0.03 10*3/uL (ref 0.00–0.07)
Basophils Absolute: 0.1 10*3/uL (ref 0.0–0.1)
Basophils Relative: 1 %
Eosinophils Absolute: 0.1 10*3/uL (ref 0.0–0.5)
Eosinophils Relative: 1 %
HCT: 43.6 % (ref 36.0–46.0)
Hemoglobin: 13.6 g/dL (ref 12.0–15.0)
Immature Granulocytes: 0 %
Lymphocytes Relative: 19 %
Lymphs Abs: 1.7 10*3/uL (ref 0.7–4.0)
MCH: 29.6 pg (ref 26.0–34.0)
MCHC: 31.2 g/dL (ref 30.0–36.0)
MCV: 94.8 fL (ref 80.0–100.0)
Monocytes Absolute: 0.4 10*3/uL (ref 0.1–1.0)
Monocytes Relative: 5 %
Neutro Abs: 6.4 10*3/uL (ref 1.7–7.7)
Neutrophils Relative %: 74 %
Platelets: 236 10*3/uL (ref 150–400)
RBC: 4.6 MIL/uL (ref 3.87–5.11)
RDW: 13.7 % (ref 11.5–15.5)
WBC: 8.6 10*3/uL (ref 4.0–10.5)
nRBC: 0 % (ref 0.0–0.2)

## 2023-11-04 LAB — COMPREHENSIVE METABOLIC PANEL
ALT: 8 U/L (ref 0–44)
AST: 20 U/L (ref 15–41)
Albumin: 3.5 g/dL (ref 3.5–5.0)
Alkaline Phosphatase: 70 U/L (ref 38–126)
Anion gap: 10 (ref 5–15)
BUN: 11 mg/dL (ref 8–23)
CO2: 23 mmol/L (ref 22–32)
Calcium: 8.5 mg/dL — ABNORMAL LOW (ref 8.9–10.3)
Chloride: 108 mmol/L (ref 98–111)
Creatinine, Ser: 0.5 mg/dL (ref 0.44–1.00)
GFR, Estimated: >60 mL/min (ref >60)
Glucose, Bld: 112 mg/dL — ABNORMAL HIGH (ref 70–99)
Potassium: 3.6 mmol/L (ref 3.5–5.1)
Sodium: 141 mmol/L (ref 135–145)
Total Bilirubin: 0.5 mg/dL (ref 0.0–1.2)
Total Protein: 6.4 g/dL — ABNORMAL LOW (ref 6.5–8.1)

## 2023-11-04 LAB — MAGNESIUM: Magnesium: 2.1 mg/dL (ref 1.7–2.4)

## 2023-11-04 MED ORDER — PALONOSETRON HCL INJECTION 0.25 MG/5ML
0.2500 mg | Freq: Once | INTRAVENOUS | Status: AC
  Administered 2023-11-04: 0.25 mg via INTRAVENOUS
  Filled 2023-11-04: qty 5

## 2023-11-04 MED ORDER — HEPARIN SOD (PORK) LOCK FLUSH 100 UNIT/ML IV SOLN
500.0000 [IU] | Freq: Once | INTRAVENOUS | Status: AC | PRN
  Administered 2023-11-04: 500 [IU]

## 2023-11-04 MED ORDER — DEXAMETHASONE SODIUM PHOSPHATE 10 MG/ML IJ SOLN
10.0000 mg | Freq: Once | INTRAMUSCULAR | Status: AC
  Administered 2023-11-04: 10 mg via INTRAVENOUS
  Filled 2023-11-04: qty 1

## 2023-11-04 MED ORDER — SODIUM CHLORIDE 0.9 % IV SOLN
150.0000 mg | Freq: Once | INTRAVENOUS | Status: AC
  Administered 2023-11-04: 150 mg via INTRAVENOUS
  Filled 2023-11-04: qty 150

## 2023-11-04 MED ORDER — TRASTUZUMAB-ANNS CHEMO 150 MG IV SOLR
8.0000 mg/kg | Freq: Once | INTRAVENOUS | Status: AC
  Administered 2023-11-04: 420 mg via INTRAVENOUS
  Filled 2023-11-04: qty 20

## 2023-11-04 MED ORDER — SODIUM CHLORIDE 0.9 % IV SOLN: INTRAVENOUS | Status: DC

## 2023-11-04 MED ORDER — SODIUM CHLORIDE 0.9 % IV SOLN
60.0000 mg/m2 | Freq: Once | INTRAVENOUS | Status: AC
  Administered 2023-11-04: 94 mg via INTRAVENOUS
  Filled 2023-11-04: qty 9.4

## 2023-11-04 MED ORDER — DIPHENHYDRAMINE HCL 25 MG PO CAPS
50.0000 mg | ORAL_CAPSULE | Freq: Once | ORAL | Status: AC
  Administered 2023-11-04: 50 mg via ORAL
  Filled 2023-11-04: qty 2

## 2023-11-04 MED ORDER — SODIUM CHLORIDE 0.9% FLUSH
10.0000 mL | INTRAVENOUS | Status: DC | PRN
  Administered 2023-11-04: 10 mL

## 2023-11-04 MED ORDER — SODIUM CHLORIDE 0.9 % IV SOLN
410.0000 mg | Freq: Once | INTRAVENOUS | Status: AC
  Administered 2023-11-04: 410 mg via INTRAVENOUS
  Filled 2023-11-04: qty 41

## 2023-11-04 MED ORDER — ACETAMINOPHEN 325 MG PO TABS
325.0000 mg | ORAL_TABLET | Freq: Once | ORAL | Status: AC
Start: 2023-11-04 — End: 2023-11-04
  Administered 2023-11-04: 325 mg via ORAL
  Filled 2023-11-04: qty 1

## 2023-11-04 MED ORDER — ACETAMINOPHEN 325 MG PO TABS
650.0000 mg | ORAL_TABLET | Freq: Once | ORAL | Status: AC
  Administered 2023-11-04: 650 mg via ORAL
  Filled 2023-11-04: qty 2

## 2023-11-04 NOTE — Progress Notes (Signed)
 Labs reviewed today. No complaints by patient today.   Treatment given per orders. Patient tolerated it well without problems. Vitals stable and discharged home from clinic via wheelchair. Follow up as scheduled.

## 2023-11-04 NOTE — Progress Notes (Signed)
 Pharmacist Chemotherapy Monitoring - Initial Assessment    Anticipated start date: 11/04/23   The following has been reviewed per standard work regarding the patient's treatment regimen: The patient's diagnosis, treatment plan and drug doses, and organ/hematologic function Lab orders and baseline tests specific to treatment regimen  The treatment plan start date, drug sequencing, and pre-medications Prior authorization status  Patient's documented medication list, including drug-drug interaction screen and prescriptions for anti-emetics and supportive care specific to the treatment regimen The drug concentrations, fluid compatibility, administration routes, and timing of the medications to be used The patient's access for treatment and lifetime cumulative dose history, if applicable  The patient's medication allergies and previous infusion related reactions, if applicable   Changes made to treatment plan:  N/A  Follow up needed:  N/A  MD setting initial creatinine for carboplatin  at 0.8 not 1.     Artie Laster, [facility], 11/04/2023  9:23 AM

## 2023-11-04 NOTE — Patient Instructions (Signed)
 [facility] [place] - A DEPT OF [facility]  Discharge Instructions: Thank you for choosing [facility] to provide your oncology and hematology care.  If you have a lab appointment with the Cancer Center - please note that after April 8th, 2024, all labs will be drawn in the cancer center.  You do not have to check in or register with the main entrance as you have in the past but will complete your check-in in the cancer center.  Wear comfortable clothing and clothing appropriate for easy access to any Portacath or PICC line.   We strive to give you quality time with your provider. You may need to reschedule your appointment if you arrive late (15 or more minutes).  Arriving late affects you and other patients whose appointments are after yours.  Also, if you miss three or more appointments without notifying the office, you may be dismissed from the clinic at the provider's discretion.      For prescription refill requests, have your pharmacy contact our office and allow 72 hours for refills to be completed.    Today you received the following chemotherapy and/or immunotherapy agents Trastuzumab -anns, docetaxel , carboplatin    To help prevent nausea and vomiting after your treatment, we encourage you to take your nausea medication as directed.  BELOW ARE SYMPTOMS THAT SHOULD BE REPORTED IMMEDIATELY: *FEVER GREATER THAN 100.4 F (38 C) OR HIGHER *CHILLS OR SWEATING *NAUSEA AND VOMITING THAT IS NOT CONTROLLED WITH YOUR NAUSEA MEDICATION *UNUSUAL SHORTNESS OF BREATH *UNUSUAL BRUISING OR BLEEDING *URINARY PROBLEMS (pain or burning when urinating, or frequent urination) *BOWEL PROBLEMS (unusual diarrhea, constipation, pain near the anus) TENDERNESS IN MOUTH AND THROAT WITH OR WITHOUT PRESENCE OF ULCERS (sore throat, sores in mouth, or a toothache) UNUSUAL RASH, SWELLING OR PAIN  UNUSUAL VAGINAL DISCHARGE OR ITCHING   Items with * indicate a potential  emergency and should be followed up as soon as possible or go to the Emergency Department if any problems should occur.  Please show the CHEMOTHERAPY ALERT CARD or IMMUNOTHERAPY ALERT CARD at check-in to the Emergency Department and triage nurse.  Should you have questions after your visit or need to cancel or reschedule your appointment, please contact [facility] [place] - A DEPT OF [facility] [phone]  and follow the prompts.  Office hours are 8:00 a.m. to 4:30 p.m. Monday - Friday. Please note that voicemails left after 4:00 p.m. may not be returned until the following business day.  We are closed weekends and major holidays. You have access to a nurse at all times for urgent questions. Please call the main number to the clinic [phone] and follow the prompts.  For any non-urgent questions, you may also contact your provider using MyChart. We now offer e-Visits for anyone 92 and older to request care online for non-urgent symptoms. For details visit [email or web address].   Also download the MyChart app! Go to the app store, search "MyChart", open the app, select , and log in with your MyChart username and password.

## 2023-11-04 NOTE — Progress Notes (Signed)
 Nutrition Assessment   Reason for Assessment: Referral   ASSESSMENT: 73 year old female with malignant neoplasm of left breast, estrogen receptor positive. She is receiving adjuvant carbo/taxol/kanjinti  q21d. Patient is under the care of Dr. Cheree Cords.  Past medical history includes arthritis, senile osteoporosis, bipolar disorder, h/o of stroke, vit D deficiency, hypocalcemia  Met with patient in infusion. Patient reports good appetite. She is eating 3 meals as usual. Recalls sausage biscuit or egg for breakfast. Has soup or sandwich at lunch. Patient ate shrimp, baked potato, slaw, and hushpuppies for dinner las night. Patient does like to snack on sweets. She likes diet pepsi and drinks 3 cans most everyday. Patient has never liked water. Currently drinking one bottle. She denies nausea, vomiting, diarrhea, constipation.    Nutrition Focused Physical Exam: deferred     Medications: zoloft , MVI, compazine , crestor , tramadol , gabapentin , vit D, oscal with D, melatonin   Labs: reviewed    Anthropometrics:   Height: 5'3" Weight: 122 lb 9.2 oz  UBW: 125-128 lb BMI: 21.71   NUTRITION DIAGNOSIS: Food and nutrition related knowledge deficit related to breast cancer as evidenced by no prior need for associated nutrition information     INTERVENTION:  Educated on importance of adequate calorie and protein energy intake to maintain strength/weights during treatment Discussed foods with protein. Recommend protein source at every meal - handout with snack ideas + list of protein foods Suggested trying ONS for added calories and protein, recommend daily if tolerated - samples + coupons Educated on importance of hydration. Offered ideas on ways to flavor water and strategies for increasing daily intake. Patient is agreeable to increase water by half bottle/day with goal of 4 bottles All questions answered Contact information provided     MONITORING, EVALUATION, GOAL: Patient will  tolerate increased calories and protein to minimize wt loss during treatment    Next Visit: Monday March 3 during infusion

## 2023-11-05 ENCOUNTER — Telehealth: Payer: Self-pay

## 2023-11-05 NOTE — Telephone Encounter (Signed)
Chemotherapy follow up call.  Patient stated she was doing well.  Small amount of nausea during the night and the compazine helped.  Denied nausea at this time.  Reviewed post instructions for side effects with understanding verbalized.

## 2023-11-06 ENCOUNTER — Inpatient Hospital Stay: Payer: Medicare Other

## 2023-11-06 VITALS — BP 136/89 | HR 94 | Temp 98.1°F | Resp 18

## 2023-11-06 DIAGNOSIS — Z8673 Personal history of transient ischemic attack (TIA), and cerebral infarction without residual deficits: Secondary | ICD-10-CM | POA: Diagnosis not present

## 2023-11-06 DIAGNOSIS — Z86718 Personal history of other venous thrombosis and embolism: Secondary | ICD-10-CM | POA: Diagnosis not present

## 2023-11-06 DIAGNOSIS — C50412 Malignant neoplasm of upper-outer quadrant of left female breast: Secondary | ICD-10-CM | POA: Diagnosis not present

## 2023-11-06 DIAGNOSIS — Z1731 Human epidermal growth factor receptor 2 positive status: Secondary | ICD-10-CM | POA: Diagnosis not present

## 2023-11-06 DIAGNOSIS — Z5112 Encounter for antineoplastic immunotherapy: Secondary | ICD-10-CM | POA: Diagnosis not present

## 2023-11-06 DIAGNOSIS — M810 Age-related osteoporosis without current pathological fracture: Secondary | ICD-10-CM | POA: Diagnosis not present

## 2023-11-06 DIAGNOSIS — Z87891 Personal history of nicotine dependence: Secondary | ICD-10-CM | POA: Diagnosis not present

## 2023-11-06 DIAGNOSIS — Z170 Estrogen receptor positive status [ER+]: Secondary | ICD-10-CM | POA: Diagnosis not present

## 2023-11-06 MED ORDER — PEGFILGRASTIM-CBQV 6 MG/0.6ML ~~LOC~~ SOSY
6.0000 mg | PREFILLED_SYRINGE | Freq: Once | SUBCUTANEOUS | Status: AC
  Administered 2023-11-06: 6 mg via SUBCUTANEOUS
  Filled 2023-11-06: qty 0.6

## 2023-11-06 NOTE — Progress Notes (Signed)
Patient tolerated injection with no complaints voiced. Site clean and dry with no bruising or swelling noted at site. See MAR for details. Band aid applied.  Patient stable during and after injection. VSS with discharge and left in satisfactory condition with no s/s of distress noted.

## 2023-11-06 NOTE — Patient Instructions (Signed)
[facility] [place] - A DEPT OF [facility]  Discharge Instructions: Thank you for choosing [facility] to provide your oncology and hematology care.  If you have a lab appointment with the Cancer Center - please note that after April 8th, 2024, all labs will be drawn in the cancer center.  You do not have to check in or register with the main entrance as you have in the past but will complete your check-in in the cancer center.  Wear comfortable clothing and clothing appropriate for easy access to any Portacath or PICC line.   We strive to give you quality time with your provider. You may need to reschedule your appointment if you arrive late (15 or more minutes).  Arriving late affects you and other patients whose appointments are after yours.  Also, if you miss three or more appointments without notifying the office, you may be dismissed from the clinic at the provider's discretion.      For prescription refill requests, have your pharmacy contact our office and allow 72 hours for refills to be completed.    Today you received the following [place], return as scheduled.   To help prevent nausea and vomiting after your treatment, we encourage you to take your nausea medication as directed.  BELOW ARE SYMPTOMS THAT SHOULD BE REPORTED IMMEDIATELY: *FEVER GREATER THAN 100.4 F (38 C) OR HIGHER *CHILLS OR SWEATING *NAUSEA AND VOMITING THAT IS NOT CONTROLLED WITH YOUR NAUSEA MEDICATION *UNUSUAL SHORTNESS OF BREATH *UNUSUAL BRUISING OR BLEEDING *URINARY PROBLEMS (pain or burning when urinating, or frequent urination) *BOWEL PROBLEMS (unusual diarrhea, constipation, pain near the anus) TENDERNESS IN MOUTH AND THROAT WITH OR WITHOUT PRESENCE OF ULCERS (sore throat, sores in mouth, or a toothache) UNUSUAL RASH, SWELLING OR PAIN  UNUSUAL VAGINAL DISCHARGE OR ITCHING   Items with * indicate a potential emergency and should be followed up as soon as possible or  go to the Emergency Department if any problems should occur.  Please show the CHEMOTHERAPY ALERT CARD or IMMUNOTHERAPY ALERT CARD at check-in to the Emergency Department and triage nurse.  Should you have questions after your visit or need to cancel or reschedule your appointment, please contact [facility] [place] - A DEPT OF [facility] [phone]  and follow the prompts.  Office hours are 8:00 a.m. to 4:30 p.m. Monday - Friday. Please note that voicemails left after 4:00 p.m. may not be returned until the following business day.  We are closed weekends and major holidays. You have access to a nurse at all times for urgent questions. Please call the main number to the clinic [phone] and follow the prompts.  For any non-urgent questions, you may also contact your provider using MyChart. We now offer e-Visits for anyone 74 and older to request care online for non-urgent symptoms. For details visit [email or web address].   Also download the MyChart app! Go to the app store, search "MyChart", open the app, select , and log in with your MyChart username and password.

## 2023-11-14 ENCOUNTER — Ambulatory Visit: Payer: Medicare Other | Admitting: "Endocrinology

## 2023-11-24 NOTE — Progress Notes (Signed)
 [facility] [address], [place] [address]    Clinic Day:  11/25/2023  Referring physician: Tommie Sams, DO  Patient Care Team: Tommie Sams, DO as PCP - General (Family Medicine) Andee Poles, MD as Consulting Physician (Psychiatry) Deatra Robinson, NP as Nurse Practitioner (Psychiatry) Doreatha Massed, MD as Medical Oncologist (Medical Oncology) Therese Sarah, RN as Oncology Nurse Navigator (Medical Oncology)   ASSESSMENT & PLAN:   Assessment: 1.  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 - Bilateral diagnostic mammogram/ultrasound (08/27/2023): 2.1 cm mass in the left breast 3 o'clock position.  No lymphadenopathy in the left axilla. 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  Ki-67 5%.  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 - Left breast lumpectomy and SLNB (09/25/2022) by Dr. Henreitta Leber - Pathology: 2.4 cm grade 2 IDC, margins negative, negative LVI/PNI.  0/3 lymph nodes involved with cancer.  pT2 pN0. - Cycle 1 of docetaxel, carboplatin and Herceptin (TCH) on 11/04/2023.   2.  Social/family history: - Lives at home by herself and is independent of ADLs and IADLs.  She retired after working at [employer].  She quit smoking 1 year ago.  Smoked half pack per day on and off since age 15.  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 - Paternal aunt died of cancer.  Type not known to the patient.  3.  History of stroke and DVT: - CVA at age 59.  DVT in March 2024 after fracture, treated with Eliquis and was taken off due to side effects.    Plan: 1.  T2 N0 G2 left breast ER/HER2 positive IDC: - She received cycle 1 of docetaxel, carboplatin and Herceptin on 11/04/2023. - She had experienced diarrhea for 1 day.  She felt tired for couple of weeks.  Denied any nausea or vomiting.  Denies any tingling or  numbness in extremities. - Reviewed labs today: Normal LFTs and creatinine.  CBC grossly normal.  Platelet count is minimally elevated at 514. - Proceed with cycle 2 today.  Will continue docetaxel at 60 mg/m dose.  RTC 3 weeks for follow-up.   2.  Osteoporosis (DEXA 07/31/2023 T -4.3): - Will check vitamin D levels at some point. - Received first Reclast on 10/16/2023.   3.  History of stroke and DVT: - She is not on anticoagulation at this time.  Will consider checking antiphospholipid antibody testing.   4.  High risk drug monitoring: - 2D echo on 10/22/2023: LVEF 60 to 65%.  Will repeat in 3 months.    Orders Placed This Encounter  Procedures   Magnesium    Standing Status:   Future    Expected Date:   03/10/2024    Expiration Date:   03/10/2025   Magnesium    Standing Status:   Future    Expected Date:   03/31/2024    Expiration Date:   03/31/2025   Magnesium    Standing Status:   Future    Expected Date:   04/21/2024    Expiration Date:   04/21/2025   Magnesium    Standing Status:   Future    Expected Date:   05/12/2024    Expiration Date:   05/12/2025      I,Katie Daubenspeck,acting as a scribe for Doreatha Massed, MD.,have documented  all relevant documentation on the behalf of Doreatha Massed, MD,as directed by  Doreatha Massed, MD while in the presence of Doreatha Massed, MD.   I, Doreatha Massed MD, have reviewed the above documentation for accuracy and completeness, and I agree with the above.   Doreatha Massed, MD   3/3/202511:04 AM  CHIEF COMPLAINT:   Diagnosis: left breast cancer, ER+/HER2+    Cancer Staging  Breast cancer of upper-outer quadrant of left female breast [facility]) Staging form: Breast, AJCC 8th Edition - Clinical stage from 09/03/2023: cT2, cN0, cM0, G2, PR-, HER2+ - Unsigned    Prior Therapy: left lumpectomy, 09/25/22  Current Therapy:  TCH    HISTORY OF PRESENT ILLNESS:   Oncology History  Breast cancer of upper-outer  quadrant of left female breast (HCC)  09/03/2023 Initial Diagnosis   Breast cancer of upper-outer quadrant of left female breast (HCC)   11/04/2023 -  Chemotherapy   Patient is on Treatment Plan : BREAST Docetaxel + Carboplatin + Trastuzumab (TCH) q21d / Trastuzumab q21d        INTERVAL HISTORY:   Pamela Henson is a 73 y.o. female presenting to clinic today for follow up of left breast cancer, ER+/HER2+. She was last seen by me on 10/22/23.  Since her last visit, she had port placed on 11/01/23.  Today, she states that she is doing well overall. Her appetite level is at 100%. Her energy level is at 90%.  PAST MEDICAL HISTORY:   Past Medical History: Past Medical History:  Diagnosis Date   Anemia    Bipolar 1 disorder (HCC)    Bipolar 1 disorder (HCC)    Complication of anesthesia    Headache    History of chicken pox    History of measles    History of mumps    PONV (postoperative nausea and vomiting)    Stroke (HCC) 1975   Urine retention    takes Flomax    Surgical History: Past Surgical History:  Procedure Laterality Date   ABDOMINAL HYSTERECTOMY  09/24/1977   age 89 due to vaginal bleeding   BREAST BIOPSY Left 08/30/2023   [place] LT BREAST BX W LOC DEV 1ST LESION IMG BX SPEC US GUIDE 08/30/2023 [facility]   COLONOSCOPY N/A 10/30/2017   Procedure: COLONOSCOPY;  Surgeon: Malissa Hippo, MD;  Location: [facility];  Service: Endoscopy;  Laterality: N/A;  1225   COLONOSCOPY WITH PROPOFOL N/A 10/13/2022   Procedure: COLONOSCOPY WITH PROPOFOL;  Surgeon: Dolores Frame, MD;  Location: [facility];  Service: Gastroenterology;  Laterality: N/A;   ESOPHAGOGASTRODUODENOSCOPY (EGD) WITH PROPOFOL N/A 10/13/2022   Procedure: ESOPHAGOGASTRODUODENOSCOPY (EGD) WITH PROPOFOL;  Surgeon: Dolores Frame, MD;  Location: [facility];  Service: Gastroenterology;  Laterality: N/A;   ORIF TIBIA PLATEAU Right 12/12/2022   Procedure: OPEN REDUCTION INTERNAL FIXATION  (ORIF) TIBIAL PLATEAU;  Surgeon: Roby Lofts, MD;  Location: [facility];  Service: Orthopedics;  Laterality: Right;   PARTIAL MASTECTOMY WITH AXILLARY SENTINEL LYMPH NODE BIOPSY Left 09/26/2023   Procedure: PARTIAL MASTECTOMY WITH AXILLARY SENTINEL LYMPH NODE BIOPSY;  Surgeon: Lucretia Roers, MD;  Location: [facility];  Service: General;  Laterality: Left;   PERIPHERAL VASCULAR THROMBECTOMY Right 11/2022   2 DVT following surgical intervention of fracture   POLYPECTOMY  10/13/2022   Procedure: POLYPECTOMY;  Surgeon: Dolores Frame, MD;  Location: [facility];  Service: Gastroenterology;;   PORTACATH PLACEMENT Right 11/01/2023   Procedure: INSERTION PORT-A-CATH;  Surgeon: Lucretia Roers, MD;  Location: [facility];  Service: General;  Laterality: Right;    Social History: Social History   Socioeconomic History   Marital status: Widowed    Spouse name: Not on file   Number of children: 1   Years of education: Not on file   Highest education level: Some college, no degree  Occupational History   Occupation: Disablity    Comment: Due to back problems  Tobacco Use   Smoking status: Former    Current packs/day: 0.00    Average packs/day: 1 pack/day for 15.0 years (15.0 ttl pk-yrs)    Types: Cigarettes    Start date: 09/25/1983    Quit date: 09/24/1998    Years since quitting: 25.1   Smokeless tobacco: Never  Vaping Use   Vaping status: Never Used  Substance and Sexual Activity   Alcohol use: No    Alcohol/week: 0.0 standard drinks of alcohol   Drug use: No   Sexual activity: Not on file  Other Topics Concern   Not on file  Social History Narrative   Not on file   Social Drivers of Health   Financial Resource Strain: Low Risk  (08/16/2023)   Overall Financial Resource Strain (CARDIA)    Difficulty of Paying Living Expenses: Not hard at all  Food Insecurity: No Food Insecurity (08/16/2023)   Hunger Vital Sign    Worried About Running Out of Food in the Last Year: Never  true    Ran Out of Food in the Last Year: Never true  Transportation Needs: No Transportation Needs (08/16/2023)   PRAPARE - Administrator, Civil Service (Medical): No    Lack of Transportation (Non-Medical): No  Physical Activity: Inactive (08/16/2023)   Exercise Vital Sign    Days of Exercise per Week: 0 days    Minutes of Exercise per Session: 30 min  Stress: Stress Concern Present (08/16/2023)   [employer] of Occupational Health - Occupational Stress Questionnaire    Feeling of Stress : To some extent  Social Connections: Moderately Integrated (08/16/2023)   Social Connection and Isolation Panel [NHANES]    Frequency of Communication with Friends and Family: More than three times a week    Frequency of Social Gatherings with Friends and Family: More than three times a week    Attends Religious Services: 1 to 4 times per year    Active Member of [employer] or Organizations: No    Attends Engineer, structural: More than 4 times per year    Marital Status: Widowed  Intimate Partner Violence: Not At Risk (05/10/2023)   Humiliation, Afraid, Rape, and Kick questionnaire    Fear of Current or Ex-Partner: No    Emotionally Abused: No    Physically Abused: No    Sexually Abused: No    Family History: Family History  Problem Relation Age of Onset   Stroke Mother 24   Stroke Father 84   Colon cancer Neg Hx     Current Medications:  Current Outpatient Medications:    acetaminophen (TYLENOL) 500 MG tablet, Take 500 mg by mouth every 6 (six) hours as needed for mild pain, moderate pain or headache., Disp: , Rfl:    calcium-vitamin D (OSCAL WITH D) 500-5 MG-MCG tablet, Take 1 tablet by mouth 2 (two) times daily with a meal., Disp: 180 tablet, Rfl: 1   CARBOPLATIN IV, Inject into the vein., Disp: , Rfl:    Cholecalciferol (VITAMIN D) 50 MCG (2000 UT) tablet, Take 2,000 Units by mouth  daily., Disp: , Rfl:    DOCEtaxel (TAXOTERE IV), Inject into the vein., Disp: ,  Rfl:    gabapentin (NEURONTIN) 100 MG capsule, Take 100 mg by mouth 3 (three) times daily., Disp: , Rfl:    lidocaine-prilocaine (EMLA) cream, Apply to affected area once, Disp: 30 g, Rfl: 3   melatonin 5 MG TABS, Take 5 mg by mouth at bedtime as needed (sleep)., Disp: , Rfl:    Multiple Vitamins-Minerals (MULTIVITAMIN WITH MINERALS) tablet, Take 1 tablet by mouth daily., Disp: , Rfl:    prochlorperazine (COMPAZINE) 10 MG tablet, Take 1 tablet (10 mg total) by mouth every 6 (six) hours as needed for nausea or vomiting. (Patient not taking: Reported on 11/06/2023), Disp: 60 tablet, Rfl: 3   rosuvastatin (CRESTOR) 5 MG tablet, Take 1 tablet (5 mg total) by mouth daily., Disp: 90 tablet, Rfl: 3   sertraline (ZOLOFT) 50 MG tablet, Take 50 mg by mouth at bedtime., Disp: , Rfl:    tamsulosin (FLOMAX) 0.4 MG CAPS capsule, Take 1 capsule (0.4 mg total) by mouth at bedtime., Disp: 90 capsule, Rfl: 3   traMADol (ULTRAM) 50 MG tablet, Take 1 tablet (50 mg total) by mouth every 6 (six) hours as needed for severe pain (pain score 7-10)., Disp: 5 tablet, Rfl: 0   Trastuzumab-anns (KANJINTI IV), Inject into the vein., Disp: , Rfl:  No current facility-administered medications for this visit.  Facility-Administered Medications Ordered in Other Visits:    0.9 %  sodium chloride infusion, , Intravenous, Continuous, Doreatha Massed, MD   acetaminophen (TYLENOL) tablet 650 mg, 650 mg, Oral, Once, Doreatha Massed, MD   CARBOplatin (PARAPLATIN) 410 mg in sodium chloride 0.9 % 250 mL chemo infusion, 410 mg, Intravenous, Once, Doreatha Massed, MD   dexamethasone (DECADRON) injection 10 mg, 10 mg, Intravenous, Once, Doreatha Massed, MD   diphenhydrAMINE (BENADRYL) capsule 50 mg, 50 mg, Oral, Once, Doreatha Massed, MD   DOCEtaxel (TAXOTERE) 94 mg in sodium chloride 0.9 % 250 mL chemo infusion, 60 mg/m2 (Treatment Plan Recorded), Intravenous, Once, Doreatha Massed, MD   fosaprepitant (EMEND)  150 mg in sodium chloride 0.9 % 145 mL IVPB, 150 mg, Intravenous, Once, Doreatha Massed, MD   heparin lock flush 100 unit/mL, 500 Units, Intracatheter, Once PRN, Doreatha Massed, MD   palonosetron (ALOXI) injection 0.25 mg, 0.25 mg, Intravenous, Once, Doreatha Massed, MD   trastuzumab-anns [facility]) 336 mg in sodium chloride 0.9 % 250 mL chemo infusion, 6 mg/kg (Treatment Plan Recorded), Intravenous, Once, Doreatha Massed, MD   Allergies: Allergies  Allergen Reactions   Asa [Aspirin] Other (See Comments)    Had GI bleed 09/2022   Eliquis [Apixaban]     Severe headaches    Oxycodone     Agitation, caused bipolar episode     REVIEW OF SYSTEMS:   Review of Systems  Constitutional:  Positive for fatigue. Negative for chills and fever.  HENT:   Negative for lump/mass, mouth sores, nosebleeds, sore throat and trouble swallowing.   Eyes:  Negative for eye problems.  Respiratory:  Negative for cough and shortness of breath.   Cardiovascular:  Negative for chest pain, leg swelling and palpitations.  Gastrointestinal:  Positive for diarrhea. Negative for abdominal pain, constipation, nausea and vomiting.  Genitourinary:  Negative for bladder incontinence, difficulty urinating, dysuria, frequency, hematuria and nocturia.   Musculoskeletal:  Negative for arthralgias, back pain, flank pain, myalgias and neck pain.  Skin:  Negative for itching and rash.  Neurological:  Negative for dizziness, headaches and numbness.  Hematological:  Does not bruise/bleed easily.  Psychiatric/Behavioral:  Negative for depression, sleep disturbance and suicidal ideas. The patient is not nervous/anxious.   All other systems reviewed and are negative.    VITALS:   Blood pressure 127/69, pulse 75, temperature 99.2 F (37.3 C), temperature source Tympanic, resp. rate 18, weight 118 lb (53.5 kg), SpO2 97%.  Wt Readings from Last 3 Encounters:  11/25/23 118 lb (53.5 kg)  11/04/23 122 lb 9.2 oz  (55.6 kg)  11/01/23 120 lb 13 oz (54.8 kg)    Body mass index is 20.9 kg/m.  Performance status (ECOG): 1 - Symptomatic but completely ambulatory  PHYSICAL EXAM:   Physical Exam Vitals and nursing note reviewed. Exam conducted with a chaperone present.  Constitutional:      Appearance: Normal appearance.  Cardiovascular:     Rate and Rhythm: Normal rate and regular rhythm.     Pulses: Normal pulses.     Heart sounds: Normal heart sounds.  Pulmonary:     Effort: Pulmonary effort is normal.     Breath sounds: Normal breath sounds.  Abdominal:     Palpations: Abdomen is soft. There is no hepatomegaly, splenomegaly or mass.     Tenderness: There is no abdominal tenderness.  Musculoskeletal:     Right lower leg: No edema.     Left lower leg: No edema.  Lymphadenopathy:     Cervical: No cervical adenopathy.     Right cervical: No superficial, deep or posterior cervical adenopathy.    Left cervical: No superficial, deep or posterior cervical adenopathy.     Upper Body:     Right upper body: No supraclavicular or axillary adenopathy.     Left upper body: No supraclavicular or axillary adenopathy.  Neurological:     General: No focal deficit present.     Mental Status: She is alert and oriented to person, place, and time.  Psychiatric:        Mood and Affect: Mood normal.        Behavior: Behavior normal.     LABS:   CBC     Component Value Date/Time   WBC 8.2 11/25/2023 0931   RBC 4.19 11/25/2023 0931   HGB 12.8 11/25/2023 0931   HGB 15.0 08/20/2023 1122   HCT 40.1 11/25/2023 0931   HCT 46.4 08/20/2023 1122   PLT 514 (H) 11/25/2023 0931   PLT 228 08/20/2023 1122   MCV 95.7 11/25/2023 0931   MCV 95 08/20/2023 1122   MCH 30.5 11/25/2023 0931   MCHC 31.9 11/25/2023 0931   RDW 14.6 11/25/2023 0931   RDW 12.6 08/20/2023 1122   LYMPHSABS 1.4 11/25/2023 0931   MONOABS 0.6 11/25/2023 0931   EOSABS 0.0 11/25/2023 0931   BASOSABS 0.1 11/25/2023 0931    CMP       Component Value Date/Time   NA 139 11/25/2023 0931   NA 147 (H) 08/20/2023 1122   K 4.2 11/25/2023 0931   CL 108 11/25/2023 0931   CO2 24 11/25/2023 0931   GLUCOSE 97 11/25/2023 0931   BUN 13 11/25/2023 0931   BUN 12 08/20/2023 1122   CREATININE 0.44 11/25/2023 0931   CALCIUM 8.8 (L) 11/25/2023 0931   PROT 6.6 11/25/2023 0931   PROT 6.5 08/20/2023 1122   ALBUMIN 3.4 (L) 11/25/2023 0931   ALBUMIN 4.1 08/20/2023 1122   AST 20 11/25/2023 0931   ALT 9 11/25/2023 0931   ALKPHOS 70 11/25/2023 0931   BILITOT 0.4 11/25/2023 0931  BILITOT <0.2 08/20/2023 1122   GFRNONAA >60 11/25/2023 0931   GFRAA >60 05/29/2016 1145     No results found for: "CEA1", "CEA" / No results found for: "CEA1", "CEA" No results found for: "PSA1" No results found for: "ZOX096" No results found for: "CAN125"  No results found for: "TOTALPROTELP", "ALBUMINELP", "A1GS", "A2GS", "BETS", "BETA2SER", "GAMS", "MSPIKE", "SPEI" No results found for: "TIBC", "FERRITIN", "IRONPCTSAT" No results found for: "LDH"   STUDIES:   DG Chest Port 1 View Result Date: 11/01/2023 CLINICAL DATA:  Status post Port-A-Cath placement. EXAM: PORTABLE CHEST 1 VIEW COMPARISON:  October 12, 2022. FINDINGS: The heart size and mediastinal contours are within normal limits. Right internal jugular Port-A-Cath is noted with distal tip in expected position of the SVC. No pneumothorax is noted. Both lungs are clear. The visualized skeletal structures are unremarkable. IMPRESSION: Right internal jugular Port-A-Cath is noted with distal tip in expected position of the SVC. Electronically Signed   By: Lupita Raider M.D.   On: 11/01/2023 13:57   DG C-Arm 1-60 Min-No Report Result Date: 11/01/2023 Fluoroscopy was utilized by the requesting physician.  No radiographic interpretation.

## 2023-11-25 ENCOUNTER — Inpatient Hospital Stay: Payer: Medicare Other | Admitting: Hematology

## 2023-11-25 ENCOUNTER — Inpatient Hospital Stay: Payer: Medicare Other

## 2023-11-25 ENCOUNTER — Inpatient Hospital Stay: Payer: Medicare Other | Attending: Hematology

## 2023-11-25 ENCOUNTER — Inpatient Hospital Stay: Payer: Medicare Other | Admitting: Dietician

## 2023-11-25 VITALS — BP 120/64 | HR 86 | Temp 97.4°F | Resp 16

## 2023-11-25 VITALS — BP 127/69 | HR 75 | Temp 99.2°F | Resp 18 | Wt 118.0 lb

## 2023-11-25 DIAGNOSIS — Z87891 Personal history of nicotine dependence: Secondary | ICD-10-CM | POA: Diagnosis not present

## 2023-11-25 DIAGNOSIS — M810 Age-related osteoporosis without current pathological fracture: Secondary | ICD-10-CM | POA: Diagnosis not present

## 2023-11-25 DIAGNOSIS — Z170 Estrogen receptor positive status [ER+]: Secondary | ICD-10-CM | POA: Diagnosis not present

## 2023-11-25 DIAGNOSIS — C50412 Malignant neoplasm of upper-outer quadrant of left female breast: Secondary | ICD-10-CM | POA: Diagnosis not present

## 2023-11-25 DIAGNOSIS — Z8673 Personal history of transient ischemic attack (TIA), and cerebral infarction without residual deficits: Secondary | ICD-10-CM | POA: Insufficient documentation

## 2023-11-25 DIAGNOSIS — Z1731 Human epidermal growth factor receptor 2 positive status: Secondary | ICD-10-CM | POA: Insufficient documentation

## 2023-11-25 DIAGNOSIS — Z5112 Encounter for antineoplastic immunotherapy: Secondary | ICD-10-CM | POA: Insufficient documentation

## 2023-11-25 DIAGNOSIS — Z5189 Encounter for other specified aftercare: Secondary | ICD-10-CM | POA: Insufficient documentation

## 2023-11-25 DIAGNOSIS — Z86718 Personal history of other venous thrombosis and embolism: Secondary | ICD-10-CM | POA: Diagnosis not present

## 2023-11-25 DIAGNOSIS — Z5111 Encounter for antineoplastic chemotherapy: Secondary | ICD-10-CM | POA: Insufficient documentation

## 2023-11-25 LAB — CBC WITH DIFFERENTIAL/PLATELET
Abs Immature Granulocytes: 0.02 10*3/uL (ref 0.00–0.07)
Basophils Absolute: 0.1 10*3/uL (ref 0.0–0.1)
Basophils Relative: 1 %
Eosinophils Absolute: 0 10*3/uL (ref 0.0–0.5)
Eosinophils Relative: 1 %
HCT: 40.1 % (ref 36.0–46.0)
Hemoglobin: 12.8 g/dL (ref 12.0–15.0)
Immature Granulocytes: 0 %
Lymphocytes Relative: 17 %
Lymphs Abs: 1.4 10*3/uL (ref 0.7–4.0)
MCH: 30.5 pg (ref 26.0–34.0)
MCHC: 31.9 g/dL (ref 30.0–36.0)
MCV: 95.7 fL (ref 80.0–100.0)
Monocytes Absolute: 0.6 10*3/uL (ref 0.1–1.0)
Monocytes Relative: 7 %
Neutro Abs: 6.1 10*3/uL (ref 1.7–7.7)
Neutrophils Relative %: 74 %
Platelets: 514 10*3/uL — ABNORMAL HIGH (ref 150–400)
RBC: 4.19 MIL/uL (ref 3.87–5.11)
RDW: 14.6 % (ref 11.5–15.5)
WBC: 8.2 10*3/uL (ref 4.0–10.5)
nRBC: 0 % (ref 0.0–0.2)

## 2023-11-25 LAB — COMPREHENSIVE METABOLIC PANEL
ALT: 9 U/L (ref 0–44)
AST: 20 U/L (ref 15–41)
Albumin: 3.4 g/dL — ABNORMAL LOW (ref 3.5–5.0)
Alkaline Phosphatase: 70 U/L (ref 38–126)
Anion gap: 7 (ref 5–15)
BUN: 13 mg/dL (ref 8–23)
CO2: 24 mmol/L (ref 22–32)
Calcium: 8.8 mg/dL — ABNORMAL LOW (ref 8.9–10.3)
Chloride: 108 mmol/L (ref 98–111)
Creatinine, Ser: 0.44 mg/dL (ref 0.44–1.00)
GFR, Estimated: >60 mL/min (ref >60)
Glucose, Bld: 97 mg/dL (ref 70–99)
Potassium: 4.2 mmol/L (ref 3.5–5.1)
Sodium: 139 mmol/L (ref 135–145)
Total Bilirubin: 0.4 mg/dL (ref 0.0–1.2)
Total Protein: 6.6 g/dL (ref 6.5–8.1)

## 2023-11-25 LAB — MAGNESIUM: Magnesium: 2.1 mg/dL (ref 1.7–2.4)

## 2023-11-25 MED ORDER — SODIUM CHLORIDE 0.9 % IV SOLN
150.0000 mg | Freq: Once | INTRAVENOUS | Status: AC
  Administered 2023-11-25: 150 mg via INTRAVENOUS
  Filled 2023-11-25: qty 150

## 2023-11-25 MED ORDER — DEXAMETHASONE SODIUM PHOSPHATE 10 MG/ML IJ SOLN
10.0000 mg | Freq: Once | INTRAMUSCULAR | Status: AC
  Administered 2023-11-25: 10 mg via INTRAVENOUS
  Filled 2023-11-25: qty 1

## 2023-11-25 MED ORDER — HEPARIN SOD (PORK) LOCK FLUSH 100 UNIT/ML IV SOLN
500.0000 [IU] | Freq: Once | INTRAVENOUS | Status: AC | PRN
  Administered 2023-11-25: 500 [IU]

## 2023-11-25 MED ORDER — ACETAMINOPHEN 325 MG PO TABS
650.0000 mg | ORAL_TABLET | Freq: Once | ORAL | Status: AC
  Administered 2023-11-25: 650 mg via ORAL
  Filled 2023-11-25: qty 2

## 2023-11-25 MED ORDER — SODIUM CHLORIDE 0.9 % IV SOLN: INTRAVENOUS | Status: DC

## 2023-11-25 MED ORDER — TRASTUZUMAB-ANNS CHEMO 150 MG IV SOLR
6.0000 mg/kg | Freq: Once | INTRAVENOUS | Status: AC
  Administered 2023-11-25: 336 mg via INTRAVENOUS
  Filled 2023-11-25: qty 16

## 2023-11-25 MED ORDER — DIPHENHYDRAMINE HCL 25 MG PO CAPS
50.0000 mg | ORAL_CAPSULE | Freq: Once | ORAL | Status: AC
  Administered 2023-11-25: 50 mg via ORAL
  Filled 2023-11-25: qty 2

## 2023-11-25 MED ORDER — SODIUM CHLORIDE 0.9 % IV SOLN
410.0000 mg | Freq: Once | INTRAVENOUS | Status: AC
  Administered 2023-11-25: 410 mg via INTRAVENOUS
  Filled 2023-11-25: qty 41

## 2023-11-25 MED ORDER — PALONOSETRON HCL INJECTION 0.25 MG/5ML
0.2500 mg | Freq: Once | INTRAVENOUS | Status: AC
  Administered 2023-11-25: 0.25 mg via INTRAVENOUS
  Filled 2023-11-25: qty 5

## 2023-11-25 MED ORDER — SODIUM CHLORIDE 0.9 % IV SOLN
60.0000 mg/m2 | Freq: Once | INTRAVENOUS | Status: AC
  Administered 2023-11-25: 94 mg via INTRAVENOUS
  Filled 2023-11-25: qty 9.4

## 2023-11-25 NOTE — Patient Instructions (Signed)
 [facility] [place] - A DEPT OF [facility]  Discharge Instructions: Thank you for choosing [facility] to provide your oncology and hematology care.  If you have a lab appointment with the Cancer Center - please note that after April 8th, 2024, all labs will be drawn in the cancer center.  You do not have to check in or register with the main entrance as you have in the past but will complete your check-in in the cancer center.  Wear comfortable clothing and clothing appropriate for easy access to any Portacath or PICC line.   We strive to give you quality time with your provider. You may need to reschedule your appointment if you arrive late (15 or more minutes).  Arriving late affects you and other patients whose appointments are after yours.  Also, if you miss three or more appointments without notifying the office, you may be dismissed from the clinic at the provider's discretion.      For prescription refill requests, have your pharmacy contact our office and allow 72 hours for refills to be completed.    Today you received the following chemotherapy and/or immunotherapy agents Kanjiniti, Taxotere, Carboplatin    To help prevent nausea and vomiting after your treatment, we encourage you to take your nausea medication as directed.  BELOW ARE SYMPTOMS THAT SHOULD BE REPORTED IMMEDIATELY: *FEVER GREATER THAN 100.4 F (38 C) OR HIGHER *CHILLS OR SWEATING *NAUSEA AND VOMITING THAT IS NOT CONTROLLED WITH YOUR NAUSEA MEDICATION *UNUSUAL SHORTNESS OF BREATH *UNUSUAL BRUISING OR BLEEDING *URINARY PROBLEMS (pain or burning when urinating, or frequent urination) *BOWEL PROBLEMS (unusual diarrhea, constipation, pain near the anus) TENDERNESS IN MOUTH AND THROAT WITH OR WITHOUT PRESENCE OF ULCERS (sore throat, sores in mouth, or a toothache) UNUSUAL RASH, SWELLING OR PAIN  UNUSUAL VAGINAL DISCHARGE OR ITCHING   Items with * indicate a potential emergency and  should be followed up as soon as possible or go to the Emergency Department if any problems should occur.  Please show the CHEMOTHERAPY ALERT CARD or IMMUNOTHERAPY ALERT CARD at check-in to the Emergency Department and triage nurse.  Should you have questions after your visit or need to cancel or reschedule your appointment, please contact [facility] [place] - A DEPT OF [facility] [phone]  and follow the prompts.  Office hours are 8:00 a.m. to 4:30 p.m. Monday - Friday. Please note that voicemails left after 4:00 p.m. may not be returned until the following business day.  We are closed weekends and major holidays. You have access to a nurse at all times for urgent questions. Please call the main number to the clinic [phone] and follow the prompts.  For any non-urgent questions, you may also contact your provider using MyChart. We now offer e-Visits for anyone 74 and older to request care online for non-urgent symptoms. For details visit [email or web address].   Also download the MyChart app! Go to the app store, search "MyChart", open the app, select [place], and log in with your MyChart username and password.

## 2023-11-25 NOTE — Progress Notes (Signed)
 Patient tolerated chemotherapy with no complaints voiced.  Side effects with management reviewed with understanding verbalized.  Port site clean and dry with no bruising or swelling noted at site.  Good blood return noted before and after administration of chemotherapy.  Band aid applied.  Patient left in satisfactory condition with VSS and no s/s of distress noted. All follow ups as scheduled.   Venkat Ankney [employer]

## 2023-11-25 NOTE — Patient Instructions (Signed)

## 2023-11-25 NOTE — Progress Notes (Signed)
 Patient has been examined by Dr. Ellin Saba. Vital signs and labs have been reviewed by MD - ANC, Creatinine, LFTs, hemoglobin, and platelets are within treatment parameters per M.D. - pt may proceed with treatment.  Primary RN and pharmacy notified.

## 2023-11-27 ENCOUNTER — Inpatient Hospital Stay: Admitting: Licensed Clinical Social Worker

## 2023-11-27 ENCOUNTER — Inpatient Hospital Stay: Payer: Medicare Other

## 2023-11-27 VITALS — BP 110/69 | HR 84 | Temp 97.3°F | Resp 18

## 2023-11-27 DIAGNOSIS — C50412 Malignant neoplasm of upper-outer quadrant of left female breast: Secondary | ICD-10-CM

## 2023-11-27 DIAGNOSIS — M810 Age-related osteoporosis without current pathological fracture: Secondary | ICD-10-CM | POA: Diagnosis not present

## 2023-11-27 DIAGNOSIS — Z1731 Human epidermal growth factor receptor 2 positive status: Secondary | ICD-10-CM | POA: Diagnosis not present

## 2023-11-27 DIAGNOSIS — Z87891 Personal history of nicotine dependence: Secondary | ICD-10-CM | POA: Diagnosis not present

## 2023-11-27 DIAGNOSIS — Z8673 Personal history of transient ischemic attack (TIA), and cerebral infarction without residual deficits: Secondary | ICD-10-CM | POA: Diagnosis not present

## 2023-11-27 DIAGNOSIS — Z5111 Encounter for antineoplastic chemotherapy: Secondary | ICD-10-CM | POA: Diagnosis not present

## 2023-11-27 DIAGNOSIS — Z86718 Personal history of other venous thrombosis and embolism: Secondary | ICD-10-CM | POA: Diagnosis not present

## 2023-11-27 DIAGNOSIS — Z5189 Encounter for other specified aftercare: Secondary | ICD-10-CM | POA: Diagnosis not present

## 2023-11-27 DIAGNOSIS — Z5112 Encounter for antineoplastic immunotherapy: Secondary | ICD-10-CM | POA: Diagnosis not present

## 2023-11-27 MED ORDER — PEGFILGRASTIM-CBQV 6 MG/0.6ML ~~LOC~~ SOSY
6.0000 mg | PREFILLED_SYRINGE | Freq: Once | SUBCUTANEOUS | Status: AC
  Administered 2023-11-27: 6 mg via SUBCUTANEOUS
  Filled 2023-11-27: qty 0.6

## 2023-11-27 NOTE — Patient Instructions (Signed)

## 2023-11-27 NOTE — Progress Notes (Signed)
 [facility] CSW Progress Note  Visual merchandiser met with patient to discuss concerns regarding referral to [employer].  Pt had signed up for services w/ [facility] and had an interaction w/ a representative from [facility] that prompted her to decline services.  CSW spoke w/ pt regarding the interaction at length.  Pt gave verbal consent for CSW to contact [facility] regarding the situation and is open to being contacted by [employer].  CSW spoke directly w/ Victorino Dike at [facility] who will contact the patient this afternoon.  CSW to remain available as appropriate throughout duration of treatment.      Rachel Moulds, LCSW Clinical Social Worker [facility]

## 2023-11-27 NOTE — Progress Notes (Signed)
 Patient tolerated injection with no complaints voiced.  Site clean and dry with no bruising or swelling noted at site.  See MAR for details.  Band aid applied.  Patient stable during and after injection.  Vss with discharge and left in satisfactory condition with no s/s of distress noted.

## 2023-12-12 DIAGNOSIS — R338 Other retention of urine: Secondary | ICD-10-CM | POA: Diagnosis not present

## 2023-12-12 DIAGNOSIS — R350 Frequency of micturition: Secondary | ICD-10-CM | POA: Diagnosis not present

## 2023-12-15 NOTE — Progress Notes (Signed)
 [facility] [address], [place] [address]    Clinic Day:  12/16/2023  Referring physician: Tommie Sams, DO  Patient Care Team: Tommie Sams, DO as PCP - General (Family Medicine) Andee Poles, MD as Consulting Physician (Psychiatry) Deatra Robinson, NP as Nurse Practitioner (Psychiatry) Doreatha Massed, MD as Medical Oncologist (Medical Oncology) Therese Sarah, RN as Oncology Nurse Navigator (Medical Oncology)   ASSESSMENT & PLAN:   Assessment: 1.  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 - Bilateral diagnostic mammogram/ultrasound (08/27/2023): 2.1 cm mass in the left breast 3 o'clock position.  No lymphadenopathy in the left axilla. 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  Ki-67 5%.  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 - Left breast lumpectomy and SLNB (09/25/2022) by Dr. Henreitta Leber - Pathology: 2.4 cm grade 2 IDC, margins negative, negative LVI/PNI.  0/3 lymph nodes involved with cancer.  pT2 pN0. - Cycle 1 of docetaxel, carboplatin and Herceptin (TCH) on 11/04/2023.   2.  Social/family history: - Lives at home by herself and is independent of ADLs and IADLs.  She retired after working at [employer].  She quit smoking 1 year ago.  Smoked half pack per day on and off since age 41.  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 - Paternal aunt died of cancer.  Type not known to the patient.   3.  History of stroke and DVT: - CVA at age 30.  DVT in March 2024 after fracture, treated with Eliquis and was taken off due to side effects.    Plan: 1.  T2 N0 G2 left breast ER/HER2 positive IDC: - She received cycle 2 of chemotherapy on 11/25/2023.  She reported decrease in energy levels, worse in the first week after treatment.  She usually develops sore throat and loses voice for 1 week after the  treatment. - She has history of migraines and has a blind spot in the left eye prior to getting headaches.  Since the chemo started, she gets the vision changes but does not have headaches. - Reviewed labs: Normal LFTs and creatinine.  CBC grossly normal. - She will proceed with cycle 3 today.  I will decrease carboplatin to AUC 4 (328 mg) because of severe fatigue.  She will continue docetaxel dose reduced at 60 mg/m.  RTC 3 weeks for follow-up.   2.  Osteoporosis (DEXA 07/31/2023 T -4.3): - Received first Reclast on 10/16/2023.  Will check vitamin D levels at some point.   3.  History of stroke and DVT: - She is not on anticoagulation at this time.  Will consider checking antiphospholipid antibody testing after completion of chemo.   4.  High risk drug monitoring: - 2D echo on 10/22/2023 with LVEF 60 to 65%.  Repeat in 3 months.    No orders of the defined types were placed in this encounter.     I,Katie Daubenspeck,acting as a Neurosurgeon for Doreatha Massed, MD.,have documented all relevant documentation on the behalf of Doreatha Massed, MD,as directed by  Doreatha Massed, MD while in the presence of Doreatha Massed, MD.   I, Doreatha Massed MD, have reviewed the above documentation for accuracy and completeness, and I agree with the above.   Doreatha Massed, MD   3/24/20259:58 AM  CHIEF COMPLAINT:   Diagnosis:  left breast cancer, ER+/HER2+    Cancer Staging  Breast cancer of upper-outer quadrant of left female breast [facility]) Staging form: Breast, AJCC 8th Edition - Clinical stage from 09/03/2023: cT2, cN0, cM0, G2, PR-, HER2+ - Unsigned    Prior Therapy: left lumpectomy, 09/25/22   Current Therapy:  TCH    HISTORY OF PRESENT ILLNESS:   Oncology History  Breast cancer of upper-outer quadrant of left female breast (HCC)  09/03/2023 Initial Diagnosis   Breast cancer of upper-outer quadrant of left female breast (HCC)   11/04/2023 -  Chemotherapy    Patient is on Treatment Plan : BREAST Docetaxel + Carboplatin + Trastuzumab (TCH) q21d / Trastuzumab q21d        INTERVAL HISTORY:   Pamela Henson is a 73 y.o. female presenting to clinic today for follow up of left breast cancer, ER+/HER2+. She was last seen by me on 11/25/23.  Today, she states that she is doing well overall. Her appetite level is at 75%. Her energy level is at 10%.  PAST MEDICAL HISTORY:   Past Medical History: Past Medical History:  Diagnosis Date   Anemia    Bipolar 1 disorder (HCC)    Bipolar 1 disorder (HCC)    Complication of anesthesia    Headache    History of chicken pox    History of measles    History of mumps    PONV (postoperative nausea and vomiting)    Stroke (HCC) 1975   Urine retention    takes Flomax    Surgical History: Past Surgical History:  Procedure Laterality Date   ABDOMINAL HYSTERECTOMY  09/24/1977   age 74 due to vaginal bleeding   BREAST BIOPSY Left 08/30/2023   [place] LT BREAST BX W LOC DEV 1ST LESION IMG BX SPEC US GUIDE 08/30/2023 [facility] MAMMOGRAPHY   COLONOSCOPY N/A 10/30/2017   Procedure: COLONOSCOPY;  Surgeon: Malissa Hippo, MD;  Location: [facility];  Service: Endoscopy;  Laterality: N/A;  1225   COLONOSCOPY WITH PROPOFOL N/A 10/13/2022   Procedure: COLONOSCOPY WITH PROPOFOL;  Surgeon: Dolores Frame, MD;  Location: [facility];  Service: Gastroenterology;  Laterality: N/A;   ESOPHAGOGASTRODUODENOSCOPY (EGD) WITH PROPOFOL N/A 10/13/2022   Procedure: ESOPHAGOGASTRODUODENOSCOPY (EGD) WITH PROPOFOL;  Surgeon: Dolores Frame, MD;  Location: [facility];  Service: Gastroenterology;  Laterality: N/A;   ORIF TIBIA PLATEAU Right 12/12/2022   Procedure: OPEN REDUCTION INTERNAL FIXATION (ORIF) TIBIAL PLATEAU;  Surgeon: Roby Lofts, MD;  Location: [facility] OR;  Service: Orthopedics;  Laterality: Right;   PARTIAL MASTECTOMY WITH AXILLARY SENTINEL LYMPH NODE BIOPSY Left 09/26/2023   Procedure: PARTIAL MASTECTOMY WITH  AXILLARY SENTINEL LYMPH NODE BIOPSY;  Surgeon: Lucretia Roers, MD;  Location: [facility] OR[facility];  Service: General;  Laterality: Left;   PERIPHERAL VASCULAR THROMBECTOMY Right 11/2022   2 DVT following surgical intervention of fracture   POLYPECTOMY  10/13/2022   Procedure: POLYPECTOMY;  Surgeon: Dolores Frame, MD;  Location: [facility];  Service: Gastroenterology;;   PORTACATH PLACEMENT Right 11/01/2023   Procedure: INSERTION PORT-A-CATH;  Surgeon: Lucretia Roers, MD;  Location: [facility];  Service: General;  Laterality: Right;    Social History: Social History   Socioeconomic History   Marital status: Widowed    Spouse name: Not on file   Number of children: 1   Years of education: Not on file   Highest education level: Some college, no degree  Occupational History   Occupation: 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    Comment: Due to  back problems  Tobacco Use   Smoking status: Former    Current packs/day: 0.00    Average packs/day: 1 pack/day for 15.0 years (15.0 ttl pk-yrs)    Types: Cigarettes    Start date: 09/25/1983    Quit date: 09/24/1998    Years since quitting: 25.2   Smokeless tobacco: Never  Vaping Use   Vaping status: Never Used  Substance and Sexual Activity   Alcohol use: No    Alcohol/week: 0.0 standard drinks of alcohol   Drug use: No   Sexual activity: Not on file  Other Topics Concern   Not on file  Social History Narrative   Not on file   Social Drivers of Health   Financial Resource Strain: Low Risk  (08/16/2023)   Overall Financial Resource Strain (CARDIA)    Difficulty of Paying Living Expenses: Not hard at all  Food Insecurity: No Food Insecurity (08/16/2023)   Hunger Vital Sign    Worried About Running Out of Food in the Last Year: Never true    Ran Out of Food in the Last Year: Never true  Transportation Needs: No Transportation Needs (08/16/2023)   PRAPARE - Administrator, Civil Service (Medical): No    Lack of Transportation (Non-Medical): No   Physical Activity: Inactive (08/16/2023)   Exercise Vital Sign    Days of Exercise per Week: 0 days    Minutes of Exercise per Session: 30 min  Stress: Stress Concern Present (08/16/2023)   [employer] of Occupational Health - Occupational Stress Questionnaire    Feeling of Stress : To some extent  Social Connections: Moderately Integrated (08/16/2023)   Social Connection and Isolation Panel [NHANES]    Frequency of Communication with Friends and Family: More than three times a week    Frequency of Social Gatherings with Friends and Family: More than three times a week    Attends Religious Services: 1 to 4 times per year    Active Member of [employer] or Organizations: No    Attends Engineer, structural: More than 4 times per year    Marital Status: Widowed  Intimate Partner Violence: Not At Risk (05/10/2023)   Humiliation, Afraid, Rape, and Kick questionnaire    Fear of Current or Ex-Partner: No    Emotionally Abused: No    Physically Abused: No    Sexually Abused: No    Family History: Family History  Problem Relation Age of Onset   Stroke Mother 54   Stroke Father 45   Colon cancer Neg Hx     Current Medications:  Current Outpatient Medications:    acetaminophen (TYLENOL) 500 MG tablet, Take 500 mg by mouth every 6 (six) hours as needed for mild pain, moderate pain or headache., Disp: , Rfl:    calcium-vitamin D (OSCAL WITH D) 500-5 MG-MCG tablet, Take 1 tablet by mouth 2 (two) times daily with a meal., Disp: 180 tablet, Rfl: 1   CARBOPLATIN IV, Inject into the vein., Disp: , Rfl:    Cholecalciferol (VITAMIN D) 50 MCG (2000 UT) tablet, Take 2,000 Units by mouth daily., Disp: , Rfl:    DOCEtaxel (TAXOTERE IV), Inject into the vein., Disp: , Rfl:    gabapentin (NEURONTIN) 100 MG capsule, Take 100 mg by mouth 3 (three) times daily., Disp: , Rfl:    lidocaine-prilocaine (EMLA) cream, Apply to affected area once, Disp: 30 g, Rfl: 3   melatonin 5 MG TABS, Take 5 mg by  mouth at bedtime as needed (  sleep)., Disp: , Rfl:    Multiple Vitamins-Minerals (MULTIVITAMIN WITH MINERALS) tablet, Take 1 tablet by mouth daily., Disp: , Rfl:    prochlorperazine (COMPAZINE) 10 MG tablet, Take 1 tablet (10 mg total) by mouth every 6 (six) hours as needed for nausea or vomiting. (Patient not taking: Reported on 11/06/2023), Disp: 60 tablet, Rfl: 3   rosuvastatin (CRESTOR) 5 MG tablet, Take 1 tablet (5 mg total) by mouth daily., Disp: 90 tablet, Rfl: 3   sertraline (ZOLOFT) 50 MG tablet, Take 50 mg by mouth at bedtime., Disp: , Rfl:    tamsulosin (FLOMAX) 0.4 MG CAPS capsule, Take 1 capsule (0.4 mg total) by mouth at bedtime., Disp: 90 capsule, Rfl: 3   traMADol (ULTRAM) 50 MG tablet, Take 1 tablet (50 mg total) by mouth every 6 (six) hours as needed for severe pain (pain score 7-10)., Disp: 5 tablet, Rfl: 0   Trastuzumab-anns (KANJINTI IV), Inject into the vein., Disp: , Rfl:    Allergies: Allergies  Allergen Reactions   Asa [Aspirin] Other (See Comments)    Had GI bleed 09/2022   Eliquis [Apixaban]     Severe headaches    Oxycodone     Agitation, caused bipolar episode     REVIEW OF SYSTEMS:   Review of Systems  Constitutional:  Positive for fatigue. Negative for chills and fever.  HENT:   Positive for sore throat. Negative for lump/mass, mouth sores, nosebleeds and trouble swallowing.   Eyes:  Negative for eye problems.       Vision changes from migraine  Respiratory:  Negative for cough and shortness of breath.   Cardiovascular:  Negative for chest pain, leg swelling and palpitations.  Gastrointestinal:  Negative for abdominal pain, constipation, diarrhea, nausea and vomiting.  Genitourinary:  Negative for bladder incontinence, difficulty urinating, dysuria, frequency, hematuria and nocturia.   Musculoskeletal:  Negative for arthralgias, back pain, flank pain, myalgias and neck pain.  Skin:  Negative for itching and rash.  Neurological:  Negative for dizziness,  headaches and numbness.  Hematological:  Does not bruise/bleed easily.  Psychiatric/Behavioral:  Negative for depression, sleep disturbance and suicidal ideas. The patient is not nervous/anxious.   All other systems reviewed and are negative.    VITALS:   Blood pressure 104/70, pulse 93, temperature 98.3 F (36.8 C), temperature source Tympanic, resp. rate 18, SpO2 90%.  Wt Readings from Last 3 Encounters:  12/16/23 117 lb 6.4 oz (53.3 kg)  11/25/23 118 lb (53.5 kg)  11/04/23 122 lb 9.2 oz (55.6 kg)    There is no height or weight on file to calculate BMI.  Performance status (ECOG): 1 - Symptomatic but completely ambulatory  PHYSICAL EXAM:   Physical Exam Vitals and nursing note reviewed. Exam conducted with a chaperone present.  Constitutional:      Appearance: Normal appearance.  Cardiovascular:     Rate and Rhythm: Normal rate and regular rhythm.     Pulses: Normal pulses.     Heart sounds: Normal heart sounds.  Pulmonary:     Effort: Pulmonary effort is normal.     Breath sounds: Normal breath sounds.  Abdominal:     Palpations: Abdomen is soft. There is no hepatomegaly, splenomegaly or mass.     Tenderness: There is no abdominal tenderness.  Musculoskeletal:     Right lower leg: No edema.     Left lower leg: No edema.  Lymphadenopathy:     Cervical: No cervical adenopathy.     Right cervical: No superficial,  deep or posterior cervical adenopathy.    Left cervical: No superficial, deep or posterior cervical adenopathy.     Upper Body:     Right upper body: No supraclavicular or axillary adenopathy.     Left upper body: No supraclavicular or axillary adenopathy.  Neurological:     General: No focal deficit present.     Mental Status: She is alert and oriented to person, place, and time.  Psychiatric:        Mood and Affect: Mood normal.        Behavior: Behavior normal.     LABS:   CBC     Component Value Date/Time   WBC 6.8 12/16/2023 0907   RBC 4.30  12/16/2023 0907   HGB 13.2 12/16/2023 0907   HGB 15.0 08/20/2023 1122   HCT 41.4 12/16/2023 0907   HCT 46.4 08/20/2023 1122   PLT 280 12/16/2023 0907   PLT 228 08/20/2023 1122   MCV 96.3 12/16/2023 0907   MCV 95 08/20/2023 1122   MCH 30.7 12/16/2023 0907   MCHC 31.9 12/16/2023 0907   RDW 15.8 (H) 12/16/2023 0907   RDW 12.6 08/20/2023 1122   LYMPHSABS 1.1 12/16/2023 0907   MONOABS 0.5 12/16/2023 0907   EOSABS 0.0 12/16/2023 0907   BASOSABS 0.1 12/16/2023 0907    CMP      Component Value Date/Time   NA 140 12/16/2023 0907   NA 147 (H) 08/20/2023 1122   K 4.1 12/16/2023 0907   CL 105 12/16/2023 0907   CO2 26 12/16/2023 0907   GLUCOSE 103 (H) 12/16/2023 0907   BUN 12 12/16/2023 0907   BUN 12 08/20/2023 1122   CREATININE 0.45 12/16/2023 0907   CALCIUM 8.9 12/16/2023 0907   PROT 6.4 (L) 12/16/2023 0907   PROT 6.5 08/20/2023 1122   ALBUMIN 3.5 12/16/2023 0907   ALBUMIN 4.1 08/20/2023 1122   AST 20 12/16/2023 0907   ALT 9 12/16/2023 0907   ALKPHOS 72 12/16/2023 0907   BILITOT 0.5 12/16/2023 0907   BILITOT <0.2 08/20/2023 1122   GFRNONAA >60 12/16/2023 0907   GFRAA >60 05/29/2016 1145     No results found for: "CEA1", "CEA" / No results found for: "CEA1", "CEA" No results found for: "PSA1" No results found for: "XBJ478" No results found for: "CAN125"  No results found for: "TOTALPROTELP", "ALBUMINELP", "A1GS", "A2GS", "BETS", "BETA2SER", "GAMS", "MSPIKE", "SPEI" No results found for: "TIBC", "FERRITIN", "IRONPCTSAT" No results found for: "LDH"   STUDIES:   No results found.

## 2023-12-16 ENCOUNTER — Inpatient Hospital Stay: Payer: Medicare Other

## 2023-12-16 ENCOUNTER — Inpatient Hospital Stay: Payer: Medicare Other | Admitting: Dietician

## 2023-12-16 ENCOUNTER — Inpatient Hospital Stay: Payer: Medicare Other | Admitting: Hematology

## 2023-12-16 VITALS — BP 104/70 | HR 93 | Temp 98.3°F | Resp 18 | Wt 117.4 lb

## 2023-12-16 VITALS — BP 115/80 | HR 87 | Temp 98.2°F | Resp 18

## 2023-12-16 VITALS — BP 104/70 | HR 93 | Temp 98.3°F | Resp 18

## 2023-12-16 DIAGNOSIS — Z95828 Presence of other vascular implants and grafts: Secondary | ICD-10-CM

## 2023-12-16 DIAGNOSIS — C50412 Malignant neoplasm of upper-outer quadrant of left female breast: Secondary | ICD-10-CM

## 2023-12-16 DIAGNOSIS — Z5111 Encounter for antineoplastic chemotherapy: Secondary | ICD-10-CM | POA: Diagnosis not present

## 2023-12-16 DIAGNOSIS — Z87891 Personal history of nicotine dependence: Secondary | ICD-10-CM | POA: Diagnosis not present

## 2023-12-16 DIAGNOSIS — Z5112 Encounter for antineoplastic immunotherapy: Secondary | ICD-10-CM | POA: Diagnosis not present

## 2023-12-16 DIAGNOSIS — Z170 Estrogen receptor positive status [ER+]: Secondary | ICD-10-CM

## 2023-12-16 DIAGNOSIS — Z8673 Personal history of transient ischemic attack (TIA), and cerebral infarction without residual deficits: Secondary | ICD-10-CM | POA: Diagnosis not present

## 2023-12-16 DIAGNOSIS — M810 Age-related osteoporosis without current pathological fracture: Secondary | ICD-10-CM | POA: Diagnosis not present

## 2023-12-16 DIAGNOSIS — Z5189 Encounter for other specified aftercare: Secondary | ICD-10-CM | POA: Diagnosis not present

## 2023-12-16 DIAGNOSIS — Z86718 Personal history of other venous thrombosis and embolism: Secondary | ICD-10-CM | POA: Diagnosis not present

## 2023-12-16 DIAGNOSIS — Z1731 Human epidermal growth factor receptor 2 positive status: Secondary | ICD-10-CM | POA: Diagnosis not present

## 2023-12-16 LAB — COMPREHENSIVE METABOLIC PANEL
ALT: 9 U/L (ref 0–44)
AST: 20 U/L (ref 15–41)
Albumin: 3.5 g/dL (ref 3.5–5.0)
Alkaline Phosphatase: 72 U/L (ref 38–126)
Anion gap: 9 (ref 5–15)
BUN: 12 mg/dL (ref 8–23)
CO2: 26 mmol/L (ref 22–32)
Calcium: 8.9 mg/dL (ref 8.9–10.3)
Chloride: 105 mmol/L (ref 98–111)
Creatinine, Ser: 0.45 mg/dL (ref 0.44–1.00)
GFR, Estimated: >60 mL/min (ref >60)
Glucose, Bld: 103 mg/dL — ABNORMAL HIGH (ref 70–99)
Potassium: 4.1 mmol/L (ref 3.5–5.1)
Sodium: 140 mmol/L (ref 135–145)
Total Bilirubin: 0.5 mg/dL (ref 0.0–1.2)
Total Protein: 6.4 g/dL — ABNORMAL LOW (ref 6.5–8.1)

## 2023-12-16 LAB — CBC WITH DIFFERENTIAL/PLATELET
Abs Immature Granulocytes: 0.02 10*3/uL (ref 0.00–0.07)
Basophils Absolute: 0.1 10*3/uL (ref 0.0–0.1)
Basophils Relative: 2 %
Eosinophils Absolute: 0 10*3/uL (ref 0.0–0.5)
Eosinophils Relative: 0 %
HCT: 41.4 % (ref 36.0–46.0)
Hemoglobin: 13.2 g/dL (ref 12.0–15.0)
Immature Granulocytes: 0 %
Lymphocytes Relative: 16 %
Lymphs Abs: 1.1 10*3/uL (ref 0.7–4.0)
MCH: 30.7 pg (ref 26.0–34.0)
MCHC: 31.9 g/dL (ref 30.0–36.0)
MCV: 96.3 fL (ref 80.0–100.0)
Monocytes Absolute: 0.5 10*3/uL (ref 0.1–1.0)
Monocytes Relative: 7 %
Neutro Abs: 5 10*3/uL (ref 1.7–7.7)
Neutrophils Relative %: 75 %
Platelets: 280 10*3/uL (ref 150–400)
RBC: 4.3 MIL/uL (ref 3.87–5.11)
RDW: 15.8 % — ABNORMAL HIGH (ref 11.5–15.5)
WBC: 6.8 10*3/uL (ref 4.0–10.5)
nRBC: 0 % (ref 0.0–0.2)

## 2023-12-16 LAB — MAGNESIUM: Magnesium: 2.1 mg/dL (ref 1.7–2.4)

## 2023-12-16 MED ORDER — DIPHENHYDRAMINE HCL 25 MG PO CAPS
50.0000 mg | ORAL_CAPSULE | Freq: Once | ORAL | Status: AC
  Administered 2023-12-16: 50 mg via ORAL
  Filled 2023-12-16: qty 2

## 2023-12-16 MED ORDER — ACETAMINOPHEN 325 MG PO TABS
650.0000 mg | ORAL_TABLET | Freq: Once | ORAL | Status: AC
  Administered 2023-12-16: 650 mg via ORAL
  Filled 2023-12-16: qty 2

## 2023-12-16 MED ORDER — TRASTUZUMAB-ANNS CHEMO 150 MG IV SOLR
6.0000 mg/kg | Freq: Once | INTRAVENOUS | Status: AC
  Administered 2023-12-16: 336 mg via INTRAVENOUS
  Filled 2023-12-16: qty 16

## 2023-12-16 MED ORDER — SODIUM CHLORIDE 0.9% FLUSH
10.0000 mL | INTRAVENOUS | Status: DC | PRN
  Administered 2023-12-16: 10 mL via INTRAVENOUS

## 2023-12-16 MED ORDER — SODIUM CHLORIDE 0.9 % IV SOLN
150.0000 mg | Freq: Once | INTRAVENOUS | Status: AC
  Administered 2023-12-16: 150 mg via INTRAVENOUS
  Filled 2023-12-16: qty 150

## 2023-12-16 MED ORDER — CARBOPLATIN CHEMO INJECTION 450 MG/45ML
328.0000 mg | Freq: Once | INTRAVENOUS | Status: AC
  Administered 2023-12-16: 330 mg via INTRAVENOUS
  Filled 2023-12-16: qty 33

## 2023-12-16 MED ORDER — DEXAMETHASONE SODIUM PHOSPHATE 10 MG/ML IJ SOLN
10.0000 mg | Freq: Once | INTRAMUSCULAR | Status: AC
  Administered 2023-12-16: 10 mg via INTRAVENOUS
  Filled 2023-12-16: qty 1

## 2023-12-16 MED ORDER — SODIUM CHLORIDE 0.9% FLUSH
10.0000 mL | INTRAVENOUS | Status: DC | PRN
  Administered 2023-12-16: 10 mL

## 2023-12-16 MED ORDER — PALONOSETRON HCL INJECTION 0.25 MG/5ML
0.2500 mg | Freq: Once | INTRAVENOUS | Status: AC
  Administered 2023-12-16: 0.25 mg via INTRAVENOUS
  Filled 2023-12-16: qty 5

## 2023-12-16 MED ORDER — SODIUM CHLORIDE 0.9 % IV SOLN
INTRAVENOUS | Status: DC
Start: 2023-12-16 — End: 2023-12-16

## 2023-12-16 MED ORDER — HEPARIN SOD (PORK) LOCK FLUSH 100 UNIT/ML IV SOLN
500.0000 [IU] | Freq: Once | INTRAVENOUS | Status: AC | PRN
  Administered 2023-12-16: 500 [IU]

## 2023-12-16 MED ORDER — DOCETAXEL CHEMO INJECTION 160 MG/16ML
60.0000 mg/m2 | Freq: Once | INTRAVENOUS | Status: AC
  Administered 2023-12-16: 94 mg via INTRAVENOUS
  Filled 2023-12-16: qty 9.4

## 2023-12-16 NOTE — Progress Notes (Signed)
 Nutrition Follow-up:  Pt with malignant neoplasm of left breast, estrogen receptor positive. She is receiving adjuvant carbo/taxol/kanjinti q21d.   Met with pt in infusion. She reports tolerating therapy well overall, other than taste changes. Some foods have no taste. Others taste horrible. Pt eating small frequent meals. Enjoys snacking on yogurt, cantaloupe, applesauce, peanut butter. She has been working to increase intake of water. Currently drinking 3 bottles/day. Pt denies nausea, vomiting, diarrhea, constipation.    Medications: reviewed   Labs: reviewed   Anthropometrics: Wt 117 lb 6.4 oz  today - trending down  3/3 - 118 lb 2/10 - 122 lb 9.2 oz  NUTRITION DIAGNOSIS: Food and nutrition related knowledge deficit improving   INTERVENTION:  Educated on strategies for taste changes, suggested trying baking soda salt water rinses several times daily and before meals Reviewed protein foods, recommend protein source at every meal Pt will continue working to increase daily water to 4 bottles    MONITORING, EVALUATION, GOAL: wt trends, intake    NEXT VISIT: Monday April 14 during treatment

## 2023-12-16 NOTE — Progress Notes (Signed)
 Patient has been examined by Dr. Ellin Saba. Vital signs and labs have been reviewed by MD - ANC, Creatinine, LFTs, hemoglobin, and platelets are within treatment parameters per M.D. - pt may proceed with treatment.  Primary RN and pharmacy notified.

## 2023-12-16 NOTE — Patient Instructions (Signed)
 [facility] [place] - A DEPT OF [facility]  Discharge Instructions: Thank you for choosing  Cancer Center to provide your oncology and hematology care.  If you have a lab appointment with the Cancer Center - please note that after April 8th, 2024, all labs will be drawn in the cancer center.  You do not have to check in or register with the main entrance as you have in the past but will complete your check-in in the cancer center.  Wear comfortable clothing and clothing appropriate for easy access to any Portacath or PICC line.   We strive to give you quality time with your provider. You may need to reschedule your appointment if you arrive late (15 or more minutes).  Arriving late affects you and other patients whose appointments are after yours.  Also, if you miss three or more appointments without notifying the office, you may be dismissed from the clinic at the provider's discretion.      For prescription refill requests, have your pharmacy contact our office and allow 72 hours for refills to be completed.    Today you received the following chemotherapy and/or immunotherapy agents Kanjinit/Docetaxel/Carboplatin   To help prevent nausea and vomiting after your treatment, we encourage you to take your nausea medication as directed.   Trastuzumab Injection What is this medication? TRASTUZUMAB (tras TOO zoo mab) treats breast cancer and stomach cancer. It works by blocking a protein that causes cancer cells to grow and multiply. This helps to slow or stop the spread of cancer cells. This medicine may be used for other purposes; ask your health care provider or pharmacist if you have questions. COMMON BRAND NAME(S): Herceptin, HERCESSI, Herzuma, KANJINTI, Ogivri, Ontruzant, Trazimera What should I tell my care team before I take this medication? They need to know if you have any of these conditions: Heart failure Lung disease An unusual or allergic  reaction to trastuzumab, other medications, foods, dyes, or preservatives Pregnant or trying to get pregnant Breast-feeding How should I use this medication? This medication is injected into a vein. It is given by your care team in a hospital or clinic setting. Talk to your care team about the use of this medication in children. It is not approved for use in children. Overdosage: If you think you have taken too much of this medicine contact a poison control center or emergency room at once. NOTE: This medicine is only for you. Do not share this medicine with others. What if I miss a dose? Keep appointments for follow-up doses. It is important not to miss your dose. Call your care team if you are unable to keep an appointment. What may interact with this medication? Certain types of chemotherapy, such as daunorubicin, doxorubicin, epirubicin, idarubicin This list may not describe all possible interactions. Give your health care provider a list of all the medicines, herbs, non-prescription drugs, or dietary supplements you use. Also tell them if you smoke, drink alcohol, or use illegal drugs. Some items may interact with your medicine. What should I watch for while using this medication? Your condition will be monitored carefully while you are receiving this medication. This medication may make you feel generally unwell. This is not uncommon, as chemotherapy affects healthy cells as well as cancer cells. Report any side effects. Continue your course of treatment even though you feel ill unless your care team tells you to stop. This medication may increase your risk of getting an infection. Call  your care team for advice if you get a fever, chills, sore throat, or other symptoms of a cold or flu. Do not treat yourself. Try to avoid being around people who are sick. Avoid taking medications that contain aspirin, acetaminophen, ibuprofen, naproxen, or ketoprofen unless instructed by your care team. These  medications can hide a fever. Talk to your care team if you may be pregnant. Serious birth defects can occur if you take this medication during pregnancy and for 7 months after the last dose. You will need a negative pregnancy test before starting this medication. Contraception is recommended while taking this medication and for 7 months after the last dose. Your care team can help you find the option that works for you. Do not breastfeed while taking this medication and for 7 months after stopping treatment. What side effects may I notice from receiving this medication? Side effects that you should report to your care team as soon as possible: Allergic reactions or angioedema--skin rash, itching or hives, swelling of the face, eyes, lips, tongue, arms, or legs, trouble swallowing or breathing Dry cough, shortness of breath or trouble breathing Heart failure--shortness of breath, swelling of the ankles, feet, or hands, sudden weight gain, unusual weakness or fatigue Infection--fever, chills, cough, or sore throat Infusion reactions--chest pain, shortness of breath or trouble breathing, feeling faint or lightheaded Side effects that usually do not require medical attention (report to your care team if they continue or are bothersome): Diarrhea Dizziness Headache Nausea Trouble sleeping Vomiting This list may not describe all possible side effects. Call your doctor for medical advice about side effects. You may report side effects to FDA at 1-800-FDA-1088. Where should I keep my medication? This medication is given in a hospital or clinic. It will not be stored at home. NOTE: This sheet is a summary. It may not cover all possible information. If you have questions about this medicine, talk to your doctor, pharmacist, or health care provider.  2024 Elsevier/Gold Standard (2022-01-23 00:00:00)  Docetaxel Injection What is this medication? DOCETAXEL (doe se TAX el) treats some types of cancer.  It works by slowing down the growth of cancer cells. This medicine may be used for other purposes; ask your health care provider or pharmacist if you have questions. COMMON BRAND NAME(S): BEIZRAY, Docefrez, Docivyx, Taxotere What should I tell my care team before I take this medication? They need to know if you have any of these conditions: Kidney disease Liver disease Low white blood cell levels Tingling of the fingers or toes or other nerve disorder An unusual or allergic reaction to docetaxel, polysorbate 80, other medications, foods, dyes, or preservatives Pregnant or trying to get pregnant Breast-feeding How should I use this medication? This medication is injected into a vein. It is given by your care team in a hospital or clinic setting. Talk to your care team about the use of this medication in children. Special care may be needed. Overdosage: If you think you have taken too much of this medicine contact a poison control center or emergency room at once. NOTE: This medicine is only for you. Do not share this medicine with others. What if I miss a dose? Keep appointments for follow-up doses. It is important not to miss your dose. Call your care team if you are unable to keep an appointment. What may interact with this medication? Do not take this medication with any of the following: Live virus vaccines This medication may also interact with the following: Certain  antibiotics, such as clarithromycin, telithromycin Certain antivirals for HIV or hepatitis Certain medications for fungal infections, such as itraconazole, ketoconazole, voriconazole Grapefruit juice Nefazodone Supplements, such as St. John's wort This list may not describe all possible interactions. Give your health care provider a list of all the medicines, herbs, non-prescription drugs, or dietary supplements you use. Also tell them if you smoke, drink alcohol, or use illegal drugs. Some items may interact with your  medicine. What should I watch for while using this medication? This medication may make you feel generally unwell. This is not uncommon as chemotherapy can affect healthy cells as well as cancer cells. Report any side effects. Continue your course of treatment even though you feel ill unless your care team tells you to stop. You may need blood work done while you are taking this medication. This medication can cause serious side effects and infusion reactions. To reduce the risk, your care team may give you other medications to take before receiving this one. Be sure to follow the directions from your care team. This medication may increase your risk of getting an infection. Call your care team for advice if you get a fever, chills, sore throat, or other symptoms of a cold or flu. Do not treat yourself. Try to avoid being around people who are sick. Avoid taking medications that contain aspirin, acetaminophen, ibuprofen, naproxen, or ketoprofen unless instructed by your care team. These medications may hide a fever. Be careful brushing or flossing your teeth or using a toothpick because you may get an infection or bleed more easily. If you have any dental work done, tell your dentist you are receiving this medication. Some products may contain alcohol. Ask your care team if this medication contains alcohol. Be sure to tell all care teams you are taking this medicine. Certain medications, like metronidazole and disulfiram, can cause an unpleasant reaction when taken with alcohol. The reaction includes flushing, headache, nausea, vomiting, sweating, and increased thirst. The reaction can last from 30 minutes to several hours. This medication may affect your coordination, reaction time, or judgement. Do not drive or operate machinery until you know how this medication affects you. Sit up or stand slowly to reduce the risk of dizzy or fainting spells. Drinking alcohol with this medication can increase the risk  of these side effects. Talk to your care team about your risk of cancer. You may be more at risk for certain types of cancer if you take this medication. Talk to your care team if you wish to become pregnant or think you might be pregnant. This medication can cause serious birth defects if taken during pregnancy or if you get pregnant within 2 months after stopping therapy. A negative pregnancy test is required before starting this medication. A reliable form of contraception is recommended while taking this medication and for 2 months after stopping it. Talk to your care team about reliable forms of contraception. Do not breast-feed while taking this medication and for 1 week after stopping therapy. Use a condom during sex and for 4 months after stopping therapy. Tell your care team right away if you think your partner might be pregnant. This medication can cause serious birth defects. This medication may cause infertility. Talk to your care team if you are concerned about your fertility. What side effects may I notice from receiving this medication? Side effects that you should report to your care team as soon as possible: Allergic reactions--skin rash, itching, hives, swelling of the face, lips,  tongue, or throat Change in vision such as blurry vision, seeing halos around lights, vision loss Infection--fever, chills, cough, or sore throat Infusion reactions--chest pain, shortness of breath or trouble breathing, feeling faint or lightheaded Low red blood cell level--unusual weakness or fatigue, dizziness, headache, trouble breathing Pain, tingling, or numbness in the hands or feet Painful swelling, warmth, or redness of the skin, blisters or sores at the infusion site Redness, blistering, peeling, or loosening of the skin, including inside the mouth Sudden or severe stomach pain, bloody diarrhea, fever, nausea, vomiting Swelling of the ankles, hands, or feet Tumor lysis syndrome (TLS)--nausea,  vomiting, diarrhea, decrease in the amount of urine, dark urine, unusual weakness or fatigue, confusion, muscle pain or cramps, fast or irregular heartbeat, joint pain Unusual bruising or bleeding Side effects that usually do not require medical attention (report to your care team if they continue or are bothersome): Change in nail shape, thickness, or color Change in taste Hair loss Increased tears This list may not describe all possible side effects. Call your doctor for medical advice about side effects. You may report side effects to FDA at 1-800-FDA-1088. Where should I keep my medication? This medication is given in a hospital or clinic. It will not be stored at home. NOTE: This sheet is a summary. It may not cover all possible information. If you have questions about this medicine, talk to your doctor, pharmacist, or health care provider.  2024 Elsevier/Gold Standard (2021-11-16 00:00:00)  Carboplatin Injection What is this medication? CARBOPLATIN (KAR boe pla tin) treats some types of cancer. It works by slowing down the growth of cancer cells. This medicine may be used for other purposes; ask your health care provider or pharmacist if you have questions. COMMON BRAND NAME(S): Paraplatin What should I tell my care team before I take this medication? They need to know if you have any of these conditions: Blood disorders Hearing problems Kidney disease Recent or ongoing radiation therapy An unusual or allergic reaction to carboplatin, cisplatin, other medications, foods, dyes, or preservatives Pregnant or trying to get pregnant Breast-feeding How should I use this medication? This medication is injected into a vein. It is given by your care team in a hospital or clinic setting. Talk to your care team about the use of this medication in children. Special care may be needed. Overdosage: If you think you have taken too much of this medicine contact a poison control center or  emergency room at once. NOTE: This medicine is only for you. Do not share this medicine with others. What if I miss a dose? Keep appointments for follow-up doses. It is important not to miss your dose. Call your care team if you are unable to keep an appointment. What may interact with this medication? Medications for seizures Some antibiotics, such as amikacin, gentamicin, neomycin, streptomycin, tobramycin Vaccines This list may not describe all possible interactions. Give your health care provider a list of all the medicines, herbs, non-prescription drugs, or dietary supplements you use. Also tell them if you smoke, drink alcohol, or use illegal drugs. Some items may interact with your medicine. What should I watch for while using this medication? Your condition will be monitored carefully while you are receiving this medication. You may need blood work while taking this medication. This medication may make you feel generally unwell. This is not uncommon, as chemotherapy can affect healthy cells as well as cancer cells. Report any side effects. Continue your course of treatment even though you feel  ill unless your care team tells you to stop. In some cases, you may be given additional medications to help with side effects. Follow all directions for their use. This medication may increase your risk of getting an infection. Call your care team for advice if you get a fever, chills, sore throat, or other symptoms of a cold or flu. Do not treat yourself. Try to avoid being around people who are sick. Avoid taking medications that contain aspirin, acetaminophen, ibuprofen, naproxen, or ketoprofen unless instructed by your care team. These medications may hide a fever. Be careful brushing or flossing your teeth or using a toothpick because you may get an infection or bleed more easily. If you have any dental work done, tell your dentist you are receiving this medication. Talk to your care team if you  wish to become pregnant or think you might be pregnant. This medication can cause serious birth defects. Talk to your care team about effective forms of contraception. Do not breast-feed while taking this medication. What side effects may I notice from receiving this medication? Side effects that you should report to your care team as soon as possible: Allergic reactions--skin rash, itching, hives, swelling of the face, lips, tongue, or throat Infection--fever, chills, cough, sore throat, wounds that don't heal, pain or trouble when passing urine, general feeling of discomfort or being unwell Low red blood cell level--unusual weakness or fatigue, dizziness, headache, trouble breathing Pain, tingling, or numbness in the hands or feet, muscle weakness, change in vision, confusion or trouble speaking, loss of balance or coordination, trouble walking, seizures Unusual bruising or bleeding Side effects that usually do not require medical attention (report to your care team if they continue or are bothersome): Hair loss Nausea Unusual weakness or fatigue Vomiting This list may not describe all possible side effects. Call your doctor for medical advice about side effects. You may report side effects to FDA at 1-800-FDA-1088. Where should I keep my medication? This medication is given in a hospital or clinic. It will not be stored at home. NOTE: This sheet is a summary. It may not cover all possible information. If you have questions about this medicine, talk to your doctor, pharmacist, or health care provider.  2024 Elsevier/Gold Standard (2022-01-02 00:00:00)  BELOW ARE SYMPTOMS THAT SHOULD BE REPORTED IMMEDIATELY: *FEVER GREATER THAN 100.4 F (38 C) OR HIGHER *CHILLS OR SWEATING *NAUSEA AND VOMITING THAT IS NOT CONTROLLED WITH YOUR NAUSEA MEDICATION *UNUSUAL SHORTNESS OF BREATH *UNUSUAL BRUISING OR BLEEDING *URINARY PROBLEMS (pain or burning when urinating, or frequent urination) *BOWEL  PROBLEMS (unusual diarrhea, constipation, pain near the anus) TENDERNESS IN MOUTH AND THROAT WITH OR WITHOUT PRESENCE OF ULCERS (sore throat, sores in mouth, or a toothache) UNUSUAL RASH, SWELLING OR PAIN  UNUSUAL VAGINAL DISCHARGE OR ITCHING   Items with * indicate a potential emergency and should be followed up as soon as possible or go to the Emergency Department if any problems should occur.  Please show the CHEMOTHERAPY ALERT CARD or IMMUNOTHERAPY ALERT CARD at check-in to the Emergency Department and triage nurse.  Should you have questions after your visit or need to cancel or reschedule your appointment, please contact [facility] [place] - A DEPT OF [facility]igh[facility] [phone]  and follow the prompts.  Office hours are 8:00 a.m. to 4:30 p.m. Monday - Friday. Please note that voicemails left after 4:00 p.m. may not be returned until the following business day.  We are closed weekends and  major holidays. You have access to a nurse at all times for urgent questions. Please call the main number to the clinic [phone] and follow the prompts.  For any non-urgent questions, you may also contact your provider using MyChart. We now offer e-Visits for anyone 57 and older to request care online for non-urgent symptoms. For details visit [email or web address].   Also download the MyChart app! Go to the app store, search "MyChart", open the app, select [place], and log in with your MyChart username and password.

## 2023-12-16 NOTE — Progress Notes (Signed)
 Patient presents today for Kanjinti/Docetaxel/Carboplatin infusion. Patient is in satisfactory condition with no new complaints voiced.  Vital signs are stable.  Labs reviewed by Dr. Ellin Saba during the office visit and all labs are within treatment parameters. Carboplatin will be dose reduced AUC 4 per Dr.Katragadda. We will proceed with treatment per MD orders.   Treatment given today per MD orders. Tolerated infusion without adverse affects. Vital signs stable. No complaints at this time. Discharged from clinic ambulatory in stable condition. Alert and oriented x 3. F/U with [facility] as scheduled.

## 2023-12-16 NOTE — Patient Instructions (Signed)

## 2023-12-16 NOTE — Progress Notes (Signed)
 Patients port flushed without difficulty.  Good blood return noted with no bruising or swelling noted at site.  Patient remains accessed for treatment.

## 2023-12-18 ENCOUNTER — Inpatient Hospital Stay: Payer: Medicare Other

## 2023-12-18 VITALS — BP 107/67 | HR 85 | Temp 96.9°F | Resp 18

## 2023-12-18 DIAGNOSIS — Z5111 Encounter for antineoplastic chemotherapy: Secondary | ICD-10-CM | POA: Diagnosis not present

## 2023-12-18 DIAGNOSIS — Z1731 Human epidermal growth factor receptor 2 positive status: Secondary | ICD-10-CM | POA: Diagnosis not present

## 2023-12-18 DIAGNOSIS — Z8673 Personal history of transient ischemic attack (TIA), and cerebral infarction without residual deficits: Secondary | ICD-10-CM | POA: Diagnosis not present

## 2023-12-18 DIAGNOSIS — M810 Age-related osteoporosis without current pathological fracture: Secondary | ICD-10-CM | POA: Diagnosis not present

## 2023-12-18 DIAGNOSIS — C50412 Malignant neoplasm of upper-outer quadrant of left female breast: Secondary | ICD-10-CM | POA: Diagnosis not present

## 2023-12-18 DIAGNOSIS — Z5112 Encounter for antineoplastic immunotherapy: Secondary | ICD-10-CM | POA: Diagnosis not present

## 2023-12-18 DIAGNOSIS — Z87891 Personal history of nicotine dependence: Secondary | ICD-10-CM | POA: Diagnosis not present

## 2023-12-18 DIAGNOSIS — Z86718 Personal history of other venous thrombosis and embolism: Secondary | ICD-10-CM | POA: Diagnosis not present

## 2023-12-18 DIAGNOSIS — Z170 Estrogen receptor positive status [ER+]: Secondary | ICD-10-CM

## 2023-12-18 DIAGNOSIS — Z5189 Encounter for other specified aftercare: Secondary | ICD-10-CM | POA: Diagnosis not present

## 2023-12-18 MED ORDER — PEGFILGRASTIM-CBQV 6 MG/0.6ML ~~LOC~~ SOSY
6.0000 mg | PREFILLED_SYRINGE | Freq: Once | SUBCUTANEOUS | Status: AC
  Administered 2023-12-18: 6 mg via SUBCUTANEOUS
  Filled 2023-12-18: qty 0.6

## 2023-12-18 NOTE — Progress Notes (Signed)
 Patient tolerated udenyca injection with no complaints voiced.  Site clean and dry with no bruising or swelling noted at site.  See MAR for details.  Band aid applied.  Patient stable during and after injection.  Vss with discharge and left in satisfactory condition with no s/s of distress noted. All follow ups as scheduled.    Pamela Henson [employer]

## 2023-12-25 ENCOUNTER — Inpatient Hospital Stay: Attending: Hematology | Admitting: Licensed Clinical Social Worker

## 2023-12-25 DIAGNOSIS — Z8673 Personal history of transient ischemic attack (TIA), and cerebral infarction without residual deficits: Secondary | ICD-10-CM | POA: Insufficient documentation

## 2023-12-25 DIAGNOSIS — Z1731 Human epidermal growth factor receptor 2 positive status: Secondary | ICD-10-CM | POA: Insufficient documentation

## 2023-12-25 DIAGNOSIS — Z5112 Encounter for antineoplastic immunotherapy: Secondary | ICD-10-CM | POA: Insufficient documentation

## 2023-12-25 DIAGNOSIS — Z170 Estrogen receptor positive status [ER+]: Secondary | ICD-10-CM

## 2023-12-25 DIAGNOSIS — Z87891 Personal history of nicotine dependence: Secondary | ICD-10-CM | POA: Insufficient documentation

## 2023-12-25 DIAGNOSIS — Z5111 Encounter for antineoplastic chemotherapy: Secondary | ICD-10-CM | POA: Insufficient documentation

## 2023-12-25 DIAGNOSIS — Z86718 Personal history of other venous thrombosis and embolism: Secondary | ICD-10-CM | POA: Insufficient documentation

## 2023-12-25 DIAGNOSIS — C50412 Malignant neoplasm of upper-outer quadrant of left female breast: Secondary | ICD-10-CM | POA: Insufficient documentation

## 2023-12-25 DIAGNOSIS — Z5189 Encounter for other specified aftercare: Secondary | ICD-10-CM | POA: Insufficient documentation

## 2023-12-25 DIAGNOSIS — M810 Age-related osteoporosis without current pathological fracture: Secondary | ICD-10-CM | POA: Insufficient documentation

## 2023-12-25 NOTE — Progress Notes (Signed)
 [facility] CSW Progress Note  Visual merchandiser  spoke w/ pt regarding financial concerns.  Pt reports she is becoming concerned regarding the co-pay she will cumulatively owe for treatment.   CSW discussed the [employer], [employer] and [employer] w/ pt.  CSW to email links for all 3 grants to pt w/ instructions for applying.  Pt to contact CSW w/ any questions regarding completion of grants.      Rachel Moulds, LCSW Clinical Social Worker [facility]

## 2024-01-06 ENCOUNTER — Inpatient Hospital Stay: Admitting: Hematology

## 2024-01-06 ENCOUNTER — Inpatient Hospital Stay: Admitting: Dietician

## 2024-01-06 ENCOUNTER — Inpatient Hospital Stay

## 2024-01-06 VITALS — BP 129/76 | HR 79 | Temp 98.1°F

## 2024-01-06 VITALS — BP 122/66 | HR 91 | Temp 97.2°F | Resp 18 | Wt 116.8 lb

## 2024-01-06 DIAGNOSIS — Z5112 Encounter for antineoplastic immunotherapy: Secondary | ICD-10-CM | POA: Diagnosis not present

## 2024-01-06 DIAGNOSIS — Z86718 Personal history of other venous thrombosis and embolism: Secondary | ICD-10-CM | POA: Diagnosis not present

## 2024-01-06 DIAGNOSIS — Z79899 Other long term (current) drug therapy: Secondary | ICD-10-CM

## 2024-01-06 DIAGNOSIS — Z8673 Personal history of transient ischemic attack (TIA), and cerebral infarction without residual deficits: Secondary | ICD-10-CM | POA: Diagnosis not present

## 2024-01-06 DIAGNOSIS — C50412 Malignant neoplasm of upper-outer quadrant of left female breast: Secondary | ICD-10-CM | POA: Diagnosis not present

## 2024-01-06 DIAGNOSIS — Z95828 Presence of other vascular implants and grafts: Secondary | ICD-10-CM

## 2024-01-06 DIAGNOSIS — Z5111 Encounter for antineoplastic chemotherapy: Secondary | ICD-10-CM | POA: Diagnosis not present

## 2024-01-06 DIAGNOSIS — Z5189 Encounter for other specified aftercare: Secondary | ICD-10-CM | POA: Diagnosis not present

## 2024-01-06 DIAGNOSIS — M810 Age-related osteoporosis without current pathological fracture: Secondary | ICD-10-CM | POA: Diagnosis not present

## 2024-01-06 DIAGNOSIS — Z170 Estrogen receptor positive status [ER+]: Secondary | ICD-10-CM

## 2024-01-06 DIAGNOSIS — Z1731 Human epidermal growth factor receptor 2 positive status: Secondary | ICD-10-CM | POA: Diagnosis not present

## 2024-01-06 DIAGNOSIS — Z87891 Personal history of nicotine dependence: Secondary | ICD-10-CM | POA: Diagnosis not present

## 2024-01-06 LAB — COMPREHENSIVE METABOLIC PANEL WITH GFR
ALT: 9 U/L (ref 0–44)
AST: 20 U/L (ref 15–41)
Albumin: 3.4 g/dL — ABNORMAL LOW (ref 3.5–5.0)
Alkaline Phosphatase: 86 U/L (ref 38–126)
Anion gap: 8 (ref 5–15)
BUN: 10 mg/dL (ref 8–23)
CO2: 25 mmol/L (ref 22–32)
Calcium: 8.7 mg/dL — ABNORMAL LOW (ref 8.9–10.3)
Chloride: 105 mmol/L (ref 98–111)
Creatinine, Ser: 0.4 mg/dL — ABNORMAL LOW (ref 0.44–1.00)
GFR, Estimated: >60 mL/min (ref >60)
Glucose, Bld: 103 mg/dL — ABNORMAL HIGH (ref 70–99)
Potassium: 4 mmol/L (ref 3.5–5.1)
Sodium: 138 mmol/L (ref 135–145)
Total Bilirubin: 0.3 mg/dL (ref 0.0–1.2)
Total Protein: 6.2 g/dL — ABNORMAL LOW (ref 6.5–8.1)

## 2024-01-06 LAB — CBC WITH DIFFERENTIAL/PLATELET
Abs Immature Granulocytes: 0.05 10*3/uL (ref 0.00–0.07)
Basophils Absolute: 0.1 10*3/uL (ref 0.0–0.1)
Basophils Relative: 1 %
Eosinophils Absolute: 0 10*3/uL (ref 0.0–0.5)
Eosinophils Relative: 0 %
HCT: 41.6 % (ref 36.0–46.0)
Hemoglobin: 13.1 g/dL (ref 12.0–15.0)
Immature Granulocytes: 1 %
Lymphocytes Relative: 11 %
Lymphs Abs: 1.1 10*3/uL (ref 0.7–4.0)
MCH: 30.7 pg (ref 26.0–34.0)
MCHC: 31.5 g/dL (ref 30.0–36.0)
MCV: 97.4 fL (ref 80.0–100.0)
Monocytes Absolute: 0.4 10*3/uL (ref 0.1–1.0)
Monocytes Relative: 4 %
Neutro Abs: 8.3 10*3/uL — ABNORMAL HIGH (ref 1.7–7.7)
Neutrophils Relative %: 83 %
Platelets: 314 10*3/uL (ref 150–400)
RBC: 4.27 MIL/uL (ref 3.87–5.11)
RDW: 15.9 % — ABNORMAL HIGH (ref 11.5–15.5)
WBC: 9.9 10*3/uL (ref 4.0–10.5)
nRBC: 0 % (ref 0.0–0.2)

## 2024-01-06 LAB — MAGNESIUM: Magnesium: 2.1 mg/dL (ref 1.7–2.4)

## 2024-01-06 MED ORDER — HEPARIN SOD (PORK) LOCK FLUSH 100 UNIT/ML IV SOLN: 500.0000 [IU] | Freq: Once | INTRAVENOUS | Status: DC

## 2024-01-06 MED ORDER — ACETAMINOPHEN 325 MG PO TABS
650.0000 mg | ORAL_TABLET | Freq: Once | ORAL | Status: AC
  Administered 2024-01-06: 650 mg via ORAL
  Filled 2024-01-06: qty 2

## 2024-01-06 MED ORDER — DEXAMETHASONE SODIUM PHOSPHATE 10 MG/ML IJ SOLN
10.0000 mg | Freq: Once | INTRAMUSCULAR | Status: AC
  Administered 2024-01-06: 10 mg via INTRAVENOUS
  Filled 2024-01-06: qty 1

## 2024-01-06 MED ORDER — SODIUM CHLORIDE 0.9 % IV SOLN
6.0000 mg/kg | Freq: Once | INTRAVENOUS | Status: AC
  Administered 2024-01-06: 336 mg via INTRAVENOUS
  Filled 2024-01-06: qty 16

## 2024-01-06 MED ORDER — DOCETAXEL CHEMO INJECTION 160 MG/16ML
60.0000 mg/m2 | Freq: Once | INTRAVENOUS | Status: AC
  Administered 2024-01-06: 94 mg via INTRAVENOUS
  Filled 2024-01-06: qty 9.4

## 2024-01-06 MED ORDER — CARBOPLATIN CHEMO INJECTION 450 MG/45ML
324.8000 mg | Freq: Once | INTRAVENOUS | Status: AC
  Administered 2024-01-06: 320 mg via INTRAVENOUS
  Filled 2024-01-06: qty 32

## 2024-01-06 MED ORDER — HEPARIN SOD (PORK) LOCK FLUSH 100 UNIT/ML IV SOLN
500.0000 [IU] | Freq: Once | INTRAVENOUS | Status: AC | PRN
  Administered 2024-01-06: 500 [IU]

## 2024-01-06 MED ORDER — SODIUM CHLORIDE 0.9 % IV SOLN
150.0000 mg | Freq: Once | INTRAVENOUS | Status: AC
  Administered 2024-01-06: 150 mg via INTRAVENOUS
  Filled 2024-01-06: qty 150

## 2024-01-06 MED ORDER — SODIUM CHLORIDE 0.9% FLUSH
10.0000 mL | INTRAVENOUS | Status: DC | PRN
  Administered 2024-01-06: 10 mL

## 2024-01-06 MED ORDER — DIPHENHYDRAMINE HCL 25 MG PO CAPS
50.0000 mg | ORAL_CAPSULE | Freq: Once | ORAL | Status: AC
  Administered 2024-01-06: 50 mg via ORAL
  Filled 2024-01-06: qty 2

## 2024-01-06 MED ORDER — SODIUM CHLORIDE 0.9 % IV SOLN: INTRAVENOUS | Status: DC

## 2024-01-06 MED ORDER — SODIUM CHLORIDE 0.9% FLUSH
10.0000 mL | INTRAVENOUS | Status: DC | PRN
  Administered 2024-01-06: 10 mL via INTRAVENOUS

## 2024-01-06 MED ORDER — PALONOSETRON HCL INJECTION 0.25 MG/5ML
0.2500 mg | Freq: Once | INTRAVENOUS | Status: AC
  Administered 2024-01-06: 0.25 mg via INTRAVENOUS
  Filled 2024-01-06: qty 5

## 2024-01-06 NOTE — Progress Notes (Signed)
 Patients port flushed without difficulty.  Good blood return noted with no bruising or swelling noted at site.  Patient remains accessed for treatment.

## 2024-01-06 NOTE — Patient Instructions (Signed)

## 2024-01-06 NOTE — Patient Instructions (Signed)
 [facility] [place] - A DEPT OF [facility]  Discharge Instructions: Thank you for choosing [facility] to provide your oncology and hematology care.  If you have a lab appointment with the Cancer Center - please note that after April 8th, 2024, all labs will be drawn in the cancer center.  You do not have to check in or register with the main entrance as you have in the past but will complete your check-in in the cancer center.  Wear comfortable clothing and clothing appropriate for easy access to any Portacath or PICC line.   We strive to give you quality time with your provider. You may need to reschedule your appointment if you arrive late (15 or more minutes).  Arriving late affects you and other patients whose appointments are after yours.  Also, if you miss three or more appointments without notifying the office, you may be dismissed from the clinic at the provider's discretion.      For prescription refill requests, have your pharmacy contact our office and allow 72 hours for refills to be completed.    Today you received the following chemotherapy and/or immunotherapy agents Kanjinti/Docetaxel/Carboplatin   To help prevent nausea and vomiting after your treatment, we encourage you to take your nausea medication as directed.  BELOW ARE SYMPTOMS THAT SHOULD BE REPORTED IMMEDIATELY: *FEVER GREATER THAN 100.4 F (38 C) OR HIGHER *CHILLS OR SWEATING *NAUSEA AND VOMITING THAT IS NOT CONTROLLED WITH YOUR NAUSEA MEDICATION *UNUSUAL SHORTNESS OF BREATH *UNUSUAL BRUISING OR BLEEDING *URINARY PROBLEMS (pain or burning when urinating, or frequent urination) *BOWEL PROBLEMS (unusual diarrhea, constipation, pain near the anus) TENDERNESS IN MOUTH AND THROAT WITH OR WITHOUT PRESENCE OF ULCERS (sore throat, sores in mouth, or a toothache) UNUSUAL RASH, SWELLING OR PAIN  UNUSUAL VAGINAL DISCHARGE OR ITCHING   Items with * indicate a potential emergency and  should be followed up as soon as possible or go to the Emergency Department if any problems should occur.  Please show the CHEMOTHERAPY ALERT CARD or IMMUNOTHERAPY ALERT CARD at check-in to the Emergency Department and triage nurse.  Should you have questions after your visit or need to cancel or reschedule your appointment, please contact [facility] [place] - A DEPT OF [facility] [phone]  and follow the prompts.  Office hours are 8:00 a.m. to 4:30 p.m. Monday - Friday. Please note that voicemails left after 4:00 p.m. may not be returned until the following business day.  We are closed weekends and major holidays. You have access to a nurse at all times for urgent questions. Please call the main number to the clinic [phone] and follow the prompts.  For any non-urgent questions, you may also contact your provider using MyChart. We now offer e-Visits for anyone 70 and older to request care online for non-urgent symptoms. For details visit [email or web address].   Also download the MyChart app! Go to the app store, search "MyChart", open the app, select [place], and log in with your MyChart username and password.

## 2024-01-06 NOTE — Progress Notes (Signed)
 [facility] [address], [place] [address]    Clinic Day:  01/06/2024  Referring physician: Tommie Sams, DO  Patient Care Team: Tommie Sams, DO as PCP - General (Family Medicine) Andee Poles, MD as Consulting Physician (Psychiatry) Deatra Robinson, NP as Nurse Practitioner (Psychiatry) Doreatha Massed, MD as Medical Oncologist (Medical Oncology) Therese Sarah, RN as Oncology Nurse Navigator (Medical Oncology)   ASSESSMENT & PLAN:   Assessment: 1.  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 - Bilateral diagnostic mammogram/ultrasound (08/27/2023): 2.1 cm mass in the left breast 3 o'clock position.  No lymphadenopathy in the left axilla. 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  Ki-67 5%.  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 - Left breast lumpectomy and SLNB (09/25/2022) by Dr. Henreitta Leber - Pathology: 2.4 cm grade 2 IDC, margins negative, negative LVI/PNI.  0/3 lymph nodes involved with cancer.  pT2 pN0. - Cycle 1 of docetaxel, carboplatin and Herceptin (TCH) on 11/04/2023.   2.  Social/family history: - Lives at home by herself and is independent of ADLs and IADLs.  She retired after working at [employer].  She quit smoking 1 year ago.  Smoked half pack per day on and off since age 13.  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 - Paternal aunt died of cancer.  Type not known to the patient.   3.  History of stroke and DVT: - CVA at age 89.  DVT in March 2024 after fracture, treated with Eliquis and was taken off due to side effects.    Plan: 1.  T2 N0 G2 left breast ER/HER2 positive IDC: - She received cycle 3 3 weeks ago.  She had vomiting for couple of days starting on day 2, 3-4 times per day.  Compazine helped some.  Also had diarrhea 2 to 3 days after treatment, 3-4 times per day lasted  about couple of days.  Imodium helped.  Also felt tiredness for 2 weeks. - Labs today: Normal LFTs and lites.  CBC grossly normal. - She may proceed with cycle 4 today with carboplatin AUC 4 and dose reduced docetaxel.  RTC 3 weeks for follow-up.   2.  Osteoporosis (DEXA 07/31/2023 T -4.3): - Received first Reclast on 10/16/2023.  Will check vitamin D levels after completion of chemo.   3.  History of stroke and DVT: - She is not on any anticoagulation at this time.  Will consider antiphospholipid syndrome testing after chemo completed.   4.  High risk drug monitoring: - 2D echo on 10/22/2023 with EF 60 to 65%.  Will repeat prior to next visit.    Orders Placed This Encounter  Procedures   ECHOCARDIOGRAM COMPLETE    Standing Status:   Future    Expected Date:   01/22/2024    Expiration Date:   01/05/2025    Where should this test be performed:   [facility]    Perflutren DEFINITY (image enhancing agent) should be administered unless hypersensitivity or allergy exist:   Administer Perflutren    Reason for exam-Echo:   Chemo  Z09    Release to patient:   Immediate      I,Helena R Teague,acting as a scribe for Doreatha Massed, MD.,have documented all relevant documentation on the behalf of Doreatha Massed, MD,as directed  by  Doreatha Massed, MD while in the presence of Doreatha Massed, MD.  I, Doreatha Massed MD, have reviewed the above documentation for accuracy and completeness, and I agree with the above.    Doreatha Massed, MD   4/14/202511:30 AM  CHIEF COMPLAINT:   Diagnosis: left breast cancer, ER+/HER2+    Cancer Staging  Breast cancer of upper-outer quadrant of left female breast [facility]) Staging form: Breast, AJCC 8th Edition - Clinical stage from 09/03/2023: cT2, cN0, cM0, G2, PR-, HER2+ - Unsigned    Prior Therapy: left lumpectomy, 09/25/22   Current Therapy:  TCH    HISTORY OF PRESENT ILLNESS:   Oncology History  Breast cancer of  upper-outer quadrant of left female breast (HCC)  09/03/2023 Initial Diagnosis   Breast cancer of upper-outer quadrant of left female breast (HCC)   11/04/2023 -  Chemotherapy   Patient is on Treatment Plan : BREAST Docetaxel + Carboplatin + Trastuzumab (TCH) q21d / Trastuzumab q21d        INTERVAL HISTORY:   Pamela Henson is a 73 y.o. female presenting to clinic today for follow up of left breast cancer, ER+/HER2+. She was last seen by me on 12/16/23.  Today, she states that she is doing well overall. Her appetite level is at 75%. Her energy level is at 25%.  PAST MEDICAL HISTORY:   Past Medical History: Past Medical History:  Diagnosis Date   Anemia    Bipolar 1 disorder (HCC)    Bipolar 1 disorder (HCC)    Complication of anesthesia    Headache    History of chicken pox    History of measles    History of mumps    PONV (postoperative nausea and vomiting)    Stroke (HCC) 1975   Urine retention    takes Flomax    Surgical History: Past Surgical History:  Procedure Laterality Date   ABDOMINAL HYSTERECTOMY  09/24/1977   age 28 due to vaginal bleeding   BREAST BIOPSY Left 08/30/2023   [place] LT BREAST BX W LOC DEV 1ST LESION IMG BX SPEC US GUIDE 08/30/2023 [facility] MAMMO[facility]APHY   COLONOSCOPY N/A 10/30/2017   Procedure: COLONOSCOPY;  Surgeon: Malissa Hippo, MD;  Location: [facility];  Service: Endoscopy;  Laterality: N/A;  1225   COLONOSCOPY WITH PROPOFOL N/A 10/13/2022   Procedure: COLONOSCOPY WITH PROPOFOL;  Surgeon: Dolores Frame, MD;  Location: [facility];  Service: Gastroenterology;  Laterality: N/A;   ESOPHAGOGASTRODUODENOSCOPY (EGD) WITH PROPOFOL N/A 10/13/2022   Procedure: ESOPHAGOGASTRODUODENOSCOPY (EGD) WITH PROPOFOL;  Surgeon: Dolores Frame, MD;  Location: [facility];  Service: Gastroenterology;  Laterality: N/A;   ORIF TIBIA PLATEAU Right 12/12/2022   Procedure: OPEN REDUCTION INTERNAL FIXATION (ORIF) TIBIAL PLATEAU;  Surgeon: Roby Lofts,  MD;  Location: [facility] OR;  Service: Orthopedics;  Laterality: Right;   PARTIAL MASTECTOMY WITH AXILLARY SENTINEL LYMPH NODE BIOPSY Left 09/26/2023   Procedure: PARTIAL MASTECTOMY WITH AXILLARY SENTINEL LYMPH NODE BIOPSY;  Surgeon: Lucretia Roers, MD;  Location: [facility] ORS;  Service: General;  Laterality: Left;   PERIPHERAL VASCULAR THROMBECTOMY Right 11/2022   2 DVT following surgical intervention of fracture   POLYPECTOMY  10/13/2022   Procedure: POLYPECTOMY;  Surgeon: Dolores Frame, MD;  Location: [facility];  Service: Gastroenterology;;   PORTACATH PLACEMENT Right 11/01/2023   Procedure: INSERTION PORT-A-CATH;  Surgeon: Lucretia Roers, MD;  Location: [facility];  Service: General;  Laterality: Right;    Social History: Social History   Socioeconomic History  Marital status: Widowed    Spouse name: Not on file   Number of children: 1   Years of education: Not on file   Highest education level: Some college, no degree  Occupational History   Occupation: Disablity    Comment: Due to back problems  Tobacco Use   Smoking status: Former    Current packs/day: 0.00    Average packs/day: 1 pack/day for 15.0 years (15.0 ttl pk-yrs)    Types: Cigarettes    Start date: 09/25/1983    Quit date: 09/24/1998    Years since quitting: 25.3   Smokeless tobacco: Never  Vaping Use   Vaping status: Never Used  Substance and Sexual Activity   Alcohol use: No    Alcohol/week: 0.0 standard drinks of alcohol   Drug use: No   Sexual activity: Not on file  Other Topics Concern   Not on file  Social History Narrative   Not on file   Social Drivers of Health   Financial Resource Strain: Low Risk  (08/16/2023)   Overall Financial Resource Strain (CARDIA)    Difficulty of Paying Living Expenses: Not hard at all  Food Insecurity: No Food Insecurity (08/16/2023)   Hunger Vital Sign    Worried About Running Out of Food in the Last Year: Never true    Ran Out of Food in the Last Year: Never  true  Transportation Needs: No Transportation Needs (08/16/2023)   PRAPARE - Administrator, Civil Service (Medical): No    Lack of Transportation (Non-Medical): No  Physical Activity: Inactive (08/16/2023)   Exercise Vital Sign    Days of Exercise per Week: 0 days    Minutes of Exercise per Session: 30 min  Stress: Stress Concern Present (08/16/2023)   [employer] of Occupational Health - Occupational Stress Questionnaire    Feeling of Stress : To some extent  Social Connections: Moderately Integrated (08/16/2023)   Social Connection and Isolation Panel [NHANES]    Frequency of Communication with Friends and Family: More than three times a week    Frequency of Social Gatherings with Friends and Family: More than three times a week    Attends Religious Services: 1 to 4 times per year    Active Member of [employer] or Organizations: No    Attends Engineer, structural: More than 4 times per year    Marital Status: Widowed  Intimate Partner Violence: Not At Risk (05/10/2023)   Humiliation, Afraid, Rape, and Kick questionnaire    Fear of Current or Ex-Partner: No    Emotionally Abused: No    Physically Abused: No    Sexually Abused: No    Family History: Family History  Problem Relation Age of Onset   Stroke Mother 37   Stroke Father 69   Colon cancer Neg Hx     Current Medications:  Current Outpatient Medications:    acetaminophen (TYLENOL) 500 MG tablet, Take 500 mg by mouth every 6 (six) hours as needed for mild pain, moderate pain or headache., Disp: , Rfl:    calcium-vitamin D (OSCAL WITH D) 500-5 MG-MCG tablet, Take 1 tablet by mouth 2 (two) times daily with a meal., Disp: 180 tablet, Rfl: 1   CARBOPLATIN IV, Inject into the vein., Disp: , Rfl:    Cholecalciferol (VITAMIN D) 50 MCG (2000 UT) tablet, Take 2,000 Units by mouth daily., Disp: , Rfl:    DOCEtaxel (TAXOTERE IV), Inject into the vein., Disp: , Rfl:    gabapentin (NEURONTIN)  100 MG capsule,  Take 100 mg by mouth 3 (three) times daily., Disp: , Rfl:    lidocaine-prilocaine (EMLA) cream, Apply to affected area once, Disp: 30 g, Rfl: 3   melatonin 5 MG TABS, Take 5 mg by mouth at bedtime as needed (sleep)., Disp: , Rfl:    Multiple Vitamins-Minerals (MULTIVITAMIN WITH MINERALS) tablet, Take 1 tablet by mouth daily., Disp: , Rfl:    prochlorperazine (COMPAZINE) 10 MG tablet, Take 1 tablet (10 mg total) by mouth every 6 (six) hours as needed for nausea or vomiting., Disp: 60 tablet, Rfl: 3   rosuvastatin (CRESTOR) 5 MG tablet, Take 1 tablet (5 mg total) by mouth daily., Disp: 90 tablet, Rfl: 3   sertraline (ZOLOFT) 50 MG tablet, Take 50 mg by mouth at bedtime., Disp: , Rfl:    tamsulosin (FLOMAX) 0.4 MG CAPS capsule, Take 1 capsule (0.4 mg total) by mouth at bedtime., Disp: 90 capsule, Rfl: 3   traMADol (ULTRAM) 50 MG tablet, Take 1 tablet (50 mg total) by mouth every 6 (six) hours as needed for severe pain (pain score 7-10)., Disp: 5 tablet, Rfl: 0   Trastuzumab-anns (KANJINTI IV), Inject into the vein., Disp: , Rfl:  No current facility-administered medications for this visit.  Facility-Administered Medications Ordered in Other Visits:    0.9 %  sodium chloride infusion, , Intravenous, Continuous, Paulett Boros, MD, Last Rate: 10 mL/hr at 01/06/24 1117, New Bag at 01/06/24 1117   CARBOplatin (PARAPLATIN) 320 mg in sodium chloride 0.9 % 100 mL chemo infusion, 320 mg, Intravenous, Once, Paulett Boros, MD   DOCEtaxel (TAXOTERE) 94 mg in sodium chloride 0.9 % 250 mL chemo infusion, 60 mg/m2 (Treatment Plan Recorded), Intravenous, Once, Paulett Boros, MD   fosaprepitant (EMEND) 150 mg in sodium chloride 0.9 % 145 mL IVPB, 150 mg, Intravenous, Once, Paulett Boros, MD   heparin lock flush 100 unit/mL, 500 Units, Intracatheter, Once PRN, Rickelle Sylvestre, MD   sodium chloride flush (NS) 0.9 % injection 10 mL, 10 mL, Intracatheter, PRN, Zandyr Barnhill, MD    trastuzumab-anns [facility]) 336 mg in sodium chloride 0.9 % 250 mL chemo infusion, 6 mg/kg (Treatment Plan Recorded), Intravenous, Once, Paulett Boros, MD   Allergies: Allergies  Allergen Reactions   Asa [Aspirin] Other (See Comments)    Had GI bleed 09/2022   Eliquis [Apixaban]     Severe headaches    Oxycodone     Agitation, caused bipolar episode     REVIEW OF SYSTEMS:   Review of Systems  Constitutional:  Positive for fatigue. Negative for chills and fever.  HENT:   Negative for lump/mass, mouth sores, nosebleeds, sore throat and trouble swallowing.   Eyes:  Negative for eye problems.  Respiratory:  Negative for cough and shortness of breath.   Cardiovascular:  Negative for chest pain, leg swelling and palpitations.  Gastrointestinal:  Positive for diarrhea, nausea and vomiting. Negative for abdominal pain and constipation.  Genitourinary:  Negative for bladder incontinence, difficulty urinating, dysuria, frequency, hematuria and nocturia.   Musculoskeletal:  Negative for arthralgias, back pain, flank pain, myalgias and neck pain.  Skin:  Negative for itching and rash.  Neurological:  Negative for dizziness, headaches and numbness.  Hematological:  Does not bruise/bleed easily.  Psychiatric/Behavioral:  Negative for depression, sleep disturbance and suicidal ideas. The patient is not nervous/anxious.   All other systems reviewed and are negative.    VITALS:   There were no vitals taken for this visit.  Wt Readings from Last 3 Encounters:  01/06/24 116 lb 13.5 oz (53 kg)  12/16/23 117 lb 6.4 oz (53.3 kg)  11/25/23 118 lb (53.5 kg)    There is no height or weight on file to calculate BMI.  Performance status (ECOG): 1 - Symptomatic but completely ambulatory  PHYSICAL EXAM:   Physical Exam Vitals and nursing note reviewed. Exam conducted with a chaperone present.  Constitutional:      Appearance: Normal appearance.  Cardiovascular:     Rate and Rhythm: Normal  rate and regular rhythm.     Pulses: Normal pulses.     Heart sounds: Normal heart sounds.  Pulmonary:     Effort: Pulmonary effort is normal.     Breath sounds: Normal breath sounds.  Abdominal:     Palpations: Abdomen is soft. There is no hepatomegaly, splenomegaly or mass.     Tenderness: There is no abdominal tenderness.  Musculoskeletal:     Right lower leg: No edema.     Left lower leg: No edema.  Lymphadenopathy:     Cervical: No cervical adenopathy.     Right cervical: No superficial, deep or posterior cervical adenopathy.    Left cervical: No superficial, deep or posterior cervical adenopathy.     Upper Body:     Right upper body: No supraclavicular or axillary adenopathy.     Left upper body: No supraclavicular or axillary adenopathy.  Neurological:     General: No focal deficit present.     Mental Status: She is alert and oriented to person, place, and time.  Psychiatric:        Mood and Affect: Mood normal.        Behavior: Behavior normal.     LABS:   CBC     Component Value Date/Time   WBC 9.9 01/06/2024 1011   RBC 4.27 01/06/2024 1011   HGB 13.1 01/06/2024 1011   HGB 15.0 08/20/2023 1122   HCT 41.6 01/06/2024 1011   HCT 46.4 08/20/2023 1122   PLT 314 01/06/2024 1011   PLT 228 08/20/2023 1122   MCV 97.4 01/06/2024 1011   MCV 95 08/20/2023 1122   MCH 30.7 01/06/2024 1011   MCHC 31.5 01/06/2024 1011   RDW 15.9 (H) 01/06/2024 1011   RDW 12.6 08/20/2023 1122   LYMPHSABS 1.1 01/06/2024 1011   MONOABS 0.4 01/06/2024 1011   EOSABS 0.0 01/06/2024 1011   BASOSABS 0.1 01/06/2024 1011    CMP      Component Value Date/Time   NA 138 01/06/2024 1011   NA 147 (H) 08/20/2023 1122   K 4.0 01/06/2024 1011   CL 105 01/06/2024 1011   CO2 25 01/06/2024 1011   GLUCOSE 103 (H) 01/06/2024 1011   BUN 10 01/06/2024 1011   BUN 12 08/20/2023 1122   CREATININE 0.40 (L) 01/06/2024 1011   CALCIUM 8.7 (L) 01/06/2024 1011   PROT 6.2 (L) 01/06/2024 1011   PROT 6.5  08/20/2023 1122   ALBUMIN 3.4 (L) 01/06/2024 1011   ALBUMIN 4.1 08/20/2023 1122   AST 20 01/06/2024 1011   ALT 9 01/06/2024 1011   ALKPHOS 86 01/06/2024 1011   BILITOT 0.3 01/06/2024 1011   BILITOT <0.2 08/20/2023 1122   GFRNONAA >60 01/06/2024 1011   GFRAA >60 05/29/2016 1145     No results found for: "CEA1", "CEA" / No results found for: "CEA1", "CEA" No results found for: "PSA1" No results found for: "OZH086" No results found for: "CAN125"  No results found for: "TOTALPROTELP", "ALBUMINELP", "A1GS", "A2GS", "BETS", "BETA2SER", "GAMS", "  MSPIKE", "SPEI" No results found for: "TIBC", "FERRITIN", "IRONPCTSAT" No results found for: "LDH"   STUDIES:   No results found.

## 2024-01-06 NOTE — Progress Notes (Signed)
 Patient presents today for Kanjinti/Docetaxel/Carboplatin infusion. Patient is in satisfactory condition with no new complaints voiced.  Vital signs are stable.  Labs reviewed by Dr. Cheree Cords during the office visit and all labs are within treatment parameters.  We will proceed with treatment per MD orders.   Treatment given today per MD orders. Tolerated infusion without adverse affects. Vital signs stable. No complaints at this time. Discharged from clinic ambulatory in stable condition. Alert and oriented x 3. F/U with [facility] as scheduled.

## 2024-01-06 NOTE — Progress Notes (Signed)
 Nutrition Follow-up:  Pt with malignant neoplasm of left breast, estrogen receptor positive. She is receiving adjuvant carbo/taxol/kanjinti q21d.   Met with patient in infusion. She reports fatigue, nausea with vomiting, and episodes of diarrhea lasting 2-3 days starting day 3 after last chemotherapy. She did take antiemetics which was helpful. Patient says imodium was not really effective. Patient is eating small meals/snacks. She continues drinking 3 bottles of water. Reports drinking 5 bottles one day last week.    Medications: reviewed   Labs: albumin 3.4  Anthropometrics: Wt 116 lb 13.5 oz today   3/24 - 117 lb 6.4 oz 2/10 - 122 lb 9.2 oz   NUTRITION DIAGNOSIS: Food and nutrition related knowledge deficit improving    INTERVENTION:  Educated on strategies for nausea, foods best tolerated and foods to avoid - handout with tips provided Discussed strategies for diarrhea - provided samples of Banatrol and educated on how to take Continue working to increase intake of water Suggested taking antiemetic before bed on day 2 in efforts to curb nausea on day 3    MONITORING, EVALUATION, GOAL: wt trends, intake   NEXT VISIT: Monday May 5 during infusion

## 2024-01-08 ENCOUNTER — Inpatient Hospital Stay

## 2024-01-08 ENCOUNTER — Inpatient Hospital Stay: Admitting: Licensed Clinical Social Worker

## 2024-01-08 VITALS — BP 118/67 | HR 80 | Temp 97.4°F | Resp 18

## 2024-01-08 DIAGNOSIS — C50412 Malignant neoplasm of upper-outer quadrant of left female breast: Secondary | ICD-10-CM

## 2024-01-08 DIAGNOSIS — Z5111 Encounter for antineoplastic chemotherapy: Secondary | ICD-10-CM | POA: Diagnosis not present

## 2024-01-08 DIAGNOSIS — Z87891 Personal history of nicotine dependence: Secondary | ICD-10-CM | POA: Diagnosis not present

## 2024-01-08 DIAGNOSIS — Z5112 Encounter for antineoplastic immunotherapy: Secondary | ICD-10-CM | POA: Diagnosis not present

## 2024-01-08 DIAGNOSIS — Z5189 Encounter for other specified aftercare: Secondary | ICD-10-CM | POA: Diagnosis not present

## 2024-01-08 DIAGNOSIS — Z1731 Human epidermal growth factor receptor 2 positive status: Secondary | ICD-10-CM | POA: Diagnosis not present

## 2024-01-08 DIAGNOSIS — Z8673 Personal history of transient ischemic attack (TIA), and cerebral infarction without residual deficits: Secondary | ICD-10-CM | POA: Diagnosis not present

## 2024-01-08 DIAGNOSIS — Z86718 Personal history of other venous thrombosis and embolism: Secondary | ICD-10-CM | POA: Diagnosis not present

## 2024-01-08 DIAGNOSIS — M810 Age-related osteoporosis without current pathological fracture: Secondary | ICD-10-CM | POA: Diagnosis not present

## 2024-01-08 MED ORDER — PEGFILGRASTIM-CBQV 6 MG/0.6ML ~~LOC~~ SOSY
6.0000 mg | PREFILLED_SYRINGE | Freq: Once | SUBCUTANEOUS | Status: AC
  Administered 2024-01-08: 6 mg via SUBCUTANEOUS
  Filled 2024-01-08: qty 0.6

## 2024-01-08 NOTE — Progress Notes (Signed)
 [facility] CSW Progress Note  Clinical Child psychotherapist  contacted pt regarding financial concerns.  Pt reports she checked into applying for Medicaid, but her social security exceeds the limit.  CSW inquired if pt applied for food stamps.  Pt states she did not as she does not need assistance w/ food.  CSW explained the reason to apply is that approval will make her eligible for the [employer].  Pt verbalized understanding and will see if she qualifies.  Pt also encouraged to contact [employer] for financial assistance as she is already connected to their services.  CSW explained that paying some of the home bills through financial assistance would free up some funds to apply towards medical bills.  CSW to remain available as appropriate throughout duration of treatment to provide support.        Quenton Bruns, LCSW Clinical Social Worker [facility]

## 2024-01-08 NOTE — Progress Notes (Signed)
Treatment given per orders. Patient tolerated it well without problems. Vitals stable and discharged home from clinic ambulatory. Follow up as scheduled.  

## 2024-01-08 NOTE — Patient Instructions (Addendum)
 [facility] - A DEPT OF [facility]  Discharge Instructions: Thank you for choosing [facility] to provide your oncology and hematology care.  If you have a lab appointment with the Cancer Center - please note that after April 8th, 2024, all labs will be drawn in the cancer center.  You do not have to check in or register with the main entrance as you have in the past but will complete your check-in in the cancer center.  Wear comfortable clothing and clothing appropriate for easy access to any Portacath or PICC line.   We strive to give you quality time with your provider. You may need to reschedule your appointment if you arrive late (15 or more minutes).  Arriving late affects you and other patients whose appointments are after yours.  Also, if you miss three or more appointments without notifying the office, you may be dismissed from the clinic at the provider's discretion.      For prescription refill requests, have your pharmacy contact our office and allow 72 hours for refills to be completed.    Today you received the following injection: Udenyca   To help prevent nausea and vomiting after your treatment, we encourage you to take your nausea medication as directed.  BELOW ARE SYMPTOMS THAT SHOULD BE REPORTED IMMEDIATELY: *FEVER GREATER THAN 100.4 F (38 C) OR HIGHER *CHILLS OR SWEATING *NAUSEA AND VOMITING THAT IS NOT CONTROLLED WITH YOUR NAUSEA MEDICATION *UNUSUAL SHORTNESS OF BREATH *UNUSUAL BRUISING OR BLEEDING *URINARY PROBLEMS (pain or burning when urinating, or frequent urination) *BOWEL PROBLEMS (unusual diarrhea, constipation, pain near the anus) TENDERNESS IN MOUTH AND THROAT WITH OR WITHOUT PRESENCE OF ULCERS (sore throat, sores in mouth, or a toothache) UNUSUAL RASH, SWELLING OR PAIN  UNUSUAL VAGINAL DISCHARGE OR ITCHING   Items with * indicate a potential emergency and should be followed up as soon as possible or go to the  Emergency Department if any problems should occur.  Please show the CHEMOTHERAPY ALERT CARD or IMMUNOTHERAPY ALERT CARD at check-in to the Emergency Department and triage nurse.  Should you have questions after your visit or need to cancel or reschedule your appointment, please contact [facility] [place] - A DEPT OF [facility] [phone]  and follow the prompts.  Office hours are 8:00 a.m. to 4:30 p.m. Monday - Friday. Please note that voicemails left after 4:00 p.m. may not be returned until the following business day.  We are closed weekends and major holidays. You have access to a nurse at all times for urgent questions. Please call the main number to the clinic [phone] and follow the prompts.  For any non-urgent questions, you may also contact your provider using MyChart. We now offer e-Visits for anyone 65 and older to request care online for non-urgent symptoms. For details visit [email or web address].   Also download the MyChart app! Go to the app store, search "MyChart", open the app, select [place], and log in with your MyChart username and password.

## 2024-01-09 DIAGNOSIS — H5213 Myopia, bilateral: Secondary | ICD-10-CM | POA: Diagnosis not present

## 2024-01-16 DIAGNOSIS — F411 Generalized anxiety disorder: Secondary | ICD-10-CM | POA: Diagnosis not present

## 2024-01-16 DIAGNOSIS — F331 Major depressive disorder, recurrent, moderate: Secondary | ICD-10-CM | POA: Diagnosis not present

## 2024-01-16 DIAGNOSIS — F410 Panic disorder [episodic paroxysmal anxiety] without agoraphobia: Secondary | ICD-10-CM | POA: Diagnosis not present

## 2024-01-20 ENCOUNTER — Ambulatory Visit (HOSPITAL_COMMUNITY)
Admission: RE | Admit: 2024-01-20 | Discharge: 2024-01-20 | Disposition: A | Discharge status: Home / Self Care | Source: Ambulatory Visit | Attending: Hematology | Admitting: Hematology

## 2024-01-20 DIAGNOSIS — Z79899 Other long term (current) drug therapy: Secondary | ICD-10-CM | POA: Diagnosis not present

## 2024-01-20 LAB — ECHOCARDIOGRAM COMPLETE
AR max vel: 2.84 cm2
AV Area VTI: 3.23 cm2
AV Area mean vel: 3.09 cm2
AV Mean grad: 3 mmHg
AV Peak grad: 7.4 mmHg
Ao pk vel: 1.36 m/s
Area-P 1/2: 3.42 cm2
S' Lateral: 2.77 cm

## 2024-01-20 NOTE — Progress Notes (Signed)
*  PRELIMINARY RESULTS* Echocardiogram 2D Echocardiogram has been performed.  Pamela Henson 01/20/2024, 10:22 AM

## 2024-01-26 NOTE — Progress Notes (Signed)
 [facility] [address] [place] [address]    Clinic Day:  01/27/2024  Referring physician: Cook, Jayce G, DO  Patient Care Team: Cook, Jayce G, DO as PCP - General (Family Medicine) Pamela Plater, MD as Consulting Physician (Psychiatry) Pamela Ferry, NP as Nurse Practitioner (Psychiatry) Pamela Boros, MD as Medical Oncologist (Medical Oncology) Pamela Knuckles, RN as Oncology Nurse Navigator (Medical Oncology)   ASSESSMENT & PLAN:   Assessment: 1.  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 - Bilateral diagnostic mammogram/ultrasound (08/27/2023): 2.1 cm mass in the left breast 3 o'clock position.  No lymphadenopathy in the left axilla. 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  Ki-67 5%.  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 - Left breast lumpectomy and SLNB (09/26/2023) by Dr. Collene Dawson - Pathology: 2.4 cm grade 2 IDC, margins negative, negative LVI/PNI.  0/3 lymph nodes involved with cancer.  pT2 pN0. - Cycle 1 of docetaxel , carboplatin  and Herceptin  (TCH) on 11/04/2023.   2.  Social/family history: - Lives at home by herself and is independent of ADLs and IADLs.  She retired after working at [employer].  She quit smoking 1 year ago.  Smoked half pack per day on and off since age 89.  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 - Paternal aunt died of cancer.  Type not known to the patient.   3.  History of stroke and DVT: - CVA at age 89.  DVT in March 2024 after fracture, treated with Eliquis and was taken off due to side effects.    Plan: 1.  T2 N0 G2 left breast ER/HER2 positive IDC: - She has tolerated cycle 4 of chemotherapy reasonably well.  Did not have any diarrhea. - She had nausea x 2 and Compazine  helped.  No vomiting reported. - She felt tired for couple of weeks. - Reviewed labs  today: Normal LFTs and creatinine.  CBC grossly normal. - Proceed with cycle 5 today with dose reduced chemotherapy and Herceptin .  RTC 3 weeks for follow-up to complete final treatment. - I will make a referral to radiation oncology at that time.   2.  Osteoporosis (DEXA 07/31/2023 T -4.3): - Received first Reclast  on 10/16/2023.  Will check vitamin D  levels after completion of chemotherapy.   3.  History of stroke and DVT: - She is not on any anticoagulation at this time.  Will consider antiphospholipid syndrome testing after chemo completed.   4.  High risk drug monitoring: - We reviewed her 2D echocardiogram from 01/20/2024: LVEF 60 to 65%, unchanged.  No signs of PND or orthopnea.    No orders of the defined types were placed in this encounter.     I,Pamela Henson,acting as a Neurosurgeon for Pamela Boros, MD.,have documented all relevant documentation on the behalf of Pamela Boros, MD,as directed by  Pamela Boros, MD while in the presence of Pamela Boros, MD.   I, Pamela Boros MD, have reviewed the above documentation for accuracy and completeness, and I agree with the above.   Pamela Boros, MD   5/5/202510:14 AM  CHIEF COMPLAINT:   Diagnosis:  left breast cancer, ER+/HER2+    Cancer Staging  Breast cancer of upper-outer quadrant of left female breast [facility]) Staging form: Breast, AJCC 8th Edition - Clinical stage from 09/03/2023: cT2,  cN0, cM0, G2, PR-, HER2+ - Unsigned    Prior Therapy: left lumpectomy, 09/26/23   Current Therapy:  TCH   HISTORY OF PRESENT ILLNESS:   Oncology History  Breast cancer of upper-outer quadrant of left female breast (HCC)  09/03/2023 Initial Diagnosis   Breast cancer of upper-outer quadrant of left female breast (HCC)   11/04/2023 -  Chemotherapy   Patient is on Treatment Plan : BREAST Docetaxel  + Carboplatin  + Trastuzumab  (TCH) q21d / Trastuzumab  q21d        INTERVAL HISTORY:   Pamela Henson is a 73  y.o. female presenting to clinic today for follow up of left breast cancer, ER+/HER2+. She was last seen by me on 01/06/24.  Today, she states that she is doing well overall. Her appetite level is at 75%. Her energy level is at 25%.  PAST MEDICAL HISTORY:   Past Medical History: Past Medical History:  Diagnosis Date   Anemia    Bipolar 1 disorder (HCC)    Bipolar 1 disorder (HCC)    Complication of anesthesia    Headache    History of chicken pox    History of measles    History of mumps    PONV (postoperative nausea and vomiting)    Stroke (HCC) 1975   Urine retention    takes Flomax     Surgical History: Past Surgical History:  Procedure Laterality Date   ABDOMINAL HYSTERECTOMY  09/24/1977   age 38 due to vaginal bleeding   BREAST BIOPSY Left 08/30/2023   US  LT BREAST BX W LOC DEV 1ST LESION IMG BX SPEC US  GUIDE 08/30/2023 [facility] MAMMOGRAPHY   COLONOSCOPY N/A 10/30/2017   Procedure: COLONOSCOPY;  Surgeon: Ruby Corporal, MD;  Location: [facility];  Service: Endoscopy;  Laterality: N/A;  1225   COLONOSCOPY WITH PROPOFOL  N/A 10/13/2022   Procedure: COLONOSCOPY WITH PROPOFOL ;  Surgeon: Urban Garden, MD;  Location: [facility];  Service: Gastroenterology;  Laterality: N/A;   ESOPHAGOGASTRODUODENOSCOPY (EGD) WITH PROPOFOL  N/A 10/13/2022   Procedure: ESOPHAGOGASTRODUODENOSCOPY (EGD) WITH PROPOFOL ;  Surgeon: Urban Garden, MD;  Location: [facility];  Service: Gastroenterology;  Laterality: N/A;   ORIF TIBIA PLATEAU Right 12/12/2022   Procedure: OPEN REDUCTION INTERNAL FIXATION (ORIF) TIBIAL PLATEAU;  Surgeon: Laneta Pintos, MD;  Location: [facility] OR;  Service: Orthopedics;  Laterality: Right;   PARTIAL MASTECTOMY WITH AXILLARY SENTINEL LYMPH NODE BIOPSY Left 09/26/2023   Procedure: PARTIAL MASTECTOMY WITH AXILLARY SENTINEL LYMPH NODE BIOPSY;  Surgeon: Awilda Bogus, MD;  Location: [facility];  Service: General;  Laterality: Left;   PERIPHERAL VASCULAR  THROMBECTOMY Right 11/2022   2 DVT following surgical intervention of fracture   POLYPECTOMY  10/13/2022   Procedure: POLYPECTOMY;  Surgeon: Urban Garden, MD;  Location: [facility];  Service: Gastroenterology;;   PORTACATH PLACEMENT Right 11/01/2023   Procedure: INSERTION PORT-A-CATH;  Surgeon: Awilda Bogus, MD;  Location: [facility];  Service: General;  Laterality: Right;    Social History: Social History   Socioeconomic History   Marital status: Widowed    Spouse name: Not on file   Number of children: 1   Years of education: Not on file   Highest education level: Some college, no degree  Occupational History   Occupation: Disablity    Comment: Due to back problems  Tobacco Use   Smoking status: Former    Current packs/day: 0.00    Average packs/day: 1 pack/day for 15.0 years (15.0 ttl pk-yrs)    Types:  Cigarettes    Start date: 09/25/1983    Quit date: 09/24/1998    Years since quitting: 25.3   Smokeless tobacco: Never  Vaping Use   Vaping status: Never Used  Substance and Sexual Activity   Alcohol use: No    Alcohol/week: 0.0 standard drinks of alcohol   Drug use: No   Sexual activity: Not on file  Other Topics Concern   Not on file  Social History Narrative   Not on file   Social Drivers of Health   Financial Resource Strain: Low Risk  (08/16/2023)   Overall Financial Resource Strain (CARDIA)    Difficulty of Paying Living Expenses: Not hard at all  Food Insecurity: No Food Insecurity (08/16/2023)   Hunger Vital Sign    Worried About Running Out of Food in the Last Year: Never true    Ran Out of Food in the Last Year: Never true  Transportation Needs: No Transportation Needs (08/16/2023)   PRAPARE - Administrator, Civil Service (Medical): No    Lack of Transportation (Non-Medical): No  Physical Activity: Inactive (08/16/2023)   Exercise Vital Sign    Days of Exercise per Week: 0 days    Minutes of Exercise per Session: 30 min   Stress: Stress Concern Present (08/16/2023)   [employer] of Occupational Health - Occupational Stress Questionnaire    Feeling of Stress : To some extent  Social Connections: Moderately Integrated (08/16/2023)   Social Connection and Isolation Panel [NHANES]    Frequency of Communication with Friends and Family: More than three times a week    Frequency of Social Gatherings with Friends and Family: More than three times a week    Attends Religious Services: 1 to 4 times per year    Active Member of [employer] or Organizations: No    Attends Engineer, structural: More than 4 times per year    Marital Status: Widowed  Intimate Partner Violence: Not At Risk (05/10/2023)   Humiliation, Afraid, Rape, and Kick questionnaire    Fear of Current or Ex-Partner: No    Emotionally Abused: No    Physically Abused: No    Sexually Abused: No    Family History: Family History  Problem Relation Age of Onset   Stroke Mother 51   Stroke Father 23   Colon cancer Neg Hx     Current Medications:  Current Outpatient Medications:    acetaminophen  (TYLENOL ) 500 MG tablet, Take 500 mg by mouth every 6 (six) hours as needed for mild pain, moderate pain or headache., Disp: , Rfl:    calcium -vitamin D  (OSCAL WITH D) 500-5 MG-MCG tablet, Take 1 tablet by mouth 2 (two) times daily with a meal., Disp: 180 tablet, Rfl: 1   CARBOPLATIN  IV, Inject into the vein., Disp: , Rfl:    Cholecalciferol  (VITAMIN D ) 50 MCG (2000 UT) tablet, Take 2,000 Units by mouth daily., Disp: , Rfl:    DOCEtaxel  (TAXOTERE  IV), Inject into the vein., Disp: , Rfl:    gabapentin  (NEURONTIN ) 100 MG capsule, Take 100 mg by mouth 3 (three) times daily., Disp: , Rfl:    lidocaine -prilocaine  (EMLA ) cream, Apply to affected area once, Disp: 30 g, Rfl: 3   melatonin 5 MG TABS, Take 5 mg by mouth at bedtime as needed (sleep)., Disp: , Rfl:    Multiple Vitamins-Minerals (MULTIVITAMIN WITH MINERALS) tablet, Take 1 tablet by mouth  daily., Disp: , Rfl:    prochlorperazine  (COMPAZINE ) 10 MG tablet, Take 1 tablet (  10 mg total) by mouth every 6 (six) hours as needed for nausea or vomiting., Disp: 60 tablet, Rfl: 3   rosuvastatin  (CRESTOR ) 5 MG tablet, Take 1 tablet (5 mg total) by mouth daily., Disp: 90 tablet, Rfl: 3   sertraline  (ZOLOFT ) 50 MG tablet, Take 50 mg by mouth at bedtime., Disp: , Rfl:    tamsulosin  (FLOMAX ) 0.4 MG CAPS capsule, Take 1 capsule (0.4 mg total) by mouth at bedtime., Disp: 90 capsule, Rfl: 3   traMADol  (ULTRAM ) 50 MG tablet, Take 1 tablet (50 mg total) by mouth every 6 (six) hours as needed for severe pain (pain score 7-10)., Disp: 5 tablet, Rfl: 0   Trastuzumab -anns (KANJINTI  IV), Inject into the vein., Disp: , Rfl:    Allergies: Allergies  Allergen Reactions   Asa [Aspirin] Other (See Comments)    Had GI bleed 09/2022   Eliquis [Apixaban]     Severe headaches    Oxycodone      Agitation, caused bipolar episode     REVIEW OF SYSTEMS:   Review of Systems  Constitutional:  Negative for chills, fatigue and fever.  HENT:   Negative for lump/mass, mouth sores, nosebleeds, sore throat and trouble swallowing.   Eyes:  Negative for eye problems.  Respiratory:  Negative for cough and shortness of breath.   Cardiovascular:  Negative for chest pain, leg swelling and palpitations.  Gastrointestinal:  Positive for nausea. Negative for abdominal pain, constipation, diarrhea and vomiting.  Genitourinary:  Negative for bladder incontinence, difficulty urinating, dysuria, frequency, hematuria and nocturia.   Musculoskeletal:  Negative for arthralgias, back pain, flank pain, myalgias and neck pain.  Skin:  Negative for itching and rash.  Neurological:  Positive for headaches. Negative for dizziness and numbness.  Hematological:  Does not bruise/bleed easily.  Psychiatric/Behavioral:  Negative for depression, sleep disturbance and suicidal ideas. The patient is not nervous/anxious.   All other systems  reviewed and are negative.    VITALS:   There were no vitals taken for this visit.  Wt Readings from Last 3 Encounters:  01/27/24 129 lb 13.6 oz (58.9 kg)  01/06/24 116 lb 13.5 oz (53 kg)  12/16/23 117 lb 6.4 oz (53.3 kg)    There is no height or weight on file to calculate BMI.  Performance status (ECOG): 1 - Symptomatic but completely ambulatory  PHYSICAL EXAM:   Physical Exam Vitals and nursing note reviewed. Exam conducted with a chaperone present.  Constitutional:      Appearance: Normal appearance.  Cardiovascular:     Rate and Rhythm: Normal rate and regular rhythm.     Pulses: Normal pulses.     Heart sounds: Normal heart sounds.  Pulmonary:     Effort: Pulmonary effort is normal.     Breath sounds: Normal breath sounds.  Abdominal:     Palpations: Abdomen is soft. There is no hepatomegaly, splenomegaly or mass.     Tenderness: There is no abdominal tenderness.  Musculoskeletal:     Right lower leg: No edema.     Left lower leg: No edema.  Lymphadenopathy:     Cervical: No cervical adenopathy.     Right cervical: No superficial, deep or posterior cervical adenopathy.    Left cervical: No superficial, deep or posterior cervical adenopathy.     Upper Body:     Right upper body: No supraclavicular or axillary adenopathy.     Left upper body: No supraclavicular or axillary adenopathy.  Neurological:     General: No focal deficit  present.     Mental Status: She is alert and oriented to person, place, and time.  Psychiatric:        Mood and Affect: Mood normal.        Behavior: Behavior normal.     LABS:   CBC     Component Value Date/Time   WBC 6.3 01/27/2024 0858   RBC 4.33 01/27/2024 0858   HGB 13.2 01/27/2024 0858   HGB 15.0 08/20/2023 1122   HCT 42.5 01/27/2024 0858   HCT 46.4 08/20/2023 1122   PLT 258 01/27/2024 0858   PLT 228 08/20/2023 1122   MCV 98.2 01/27/2024 0858   MCV 95 08/20/2023 1122   MCH 30.5 01/27/2024 0858   MCHC 31.1 01/27/2024  0858   RDW 16.4 (H) 01/27/2024 0858   RDW 12.6 08/20/2023 1122   LYMPHSABS 1.2 01/27/2024 0858   MONOABS 0.4 01/27/2024 0858   EOSABS 0.0 01/27/2024 0858   BASOSABS 0.1 01/27/2024 0858    CMP      Component Value Date/Time   NA 138 01/27/2024 0858   NA 147 (H) 08/20/2023 1122   K 3.5 01/27/2024 0858   CL 104 01/27/2024 0858   CO2 23 01/27/2024 0858   GLUCOSE 140 (H) 01/27/2024 0858   BUN 11 01/27/2024 0858   BUN 12 08/20/2023 1122   CREATININE 0.53 01/27/2024 0858   CALCIUM  8.5 (L) 01/27/2024 0858   PROT 6.3 (L) 01/27/2024 0858   PROT 6.5 08/20/2023 1122   ALBUMIN 3.5 01/27/2024 0858   ALBUMIN 4.1 08/20/2023 1122   AST 20 01/27/2024 0858   ALT 8 01/27/2024 0858   ALKPHOS 84 01/27/2024 0858   BILITOT 0.3 01/27/2024 0858   BILITOT <0.2 08/20/2023 1122   GFRNONAA >60 01/27/2024 0858   GFRAA >60 05/29/2016 1145     No results found for: "CEA1", "CEA" / No results found for: "CEA1", "CEA" No results found for: "PSA1" No results found for: "VHQ469" No results found for: "CAN125"  No results found for: "TOTALPROTELP", "ALBUMINELP", "A1GS", "A2GS", "BETS", "BETA2SER", "GAMS", "MSPIKE", "SPEI" No results found for: "TIBC", "FERRITIN", "IRONPCTSAT" No results found for: "LDH"   STUDIES:   ECHOCARDIOGRAM COMPLETE Result Date: 01/20/2024    ECHOCARDIOGRAM REPORT   Patient Name:   AHMYA DEJOSEPH [facility] Date of Exam: 01/20/2024 Medical Rec #:  [id number]        Height:       63.0 in Accession #:    [id number]       Weight:       116.8 lb Date of Birth:  [birth year]         BSA:          1.539 m Patient Age:    73 years         BP:           96/59 mmHg Patient Gender: F                HR:           88 bpm. Exam Location:  [facility] Procedure: 2D Echo, 3D Echo, Cardiac Doppler and Color Doppler (Both Spectral            and Color Flow Doppler were utilized during procedure). Indications:    Chemo Z09  History:        Patient has prior history of Echocardiogram examinations, most                  recent 10/22/2023. Stroke; Risk  Factors:Dyslipidemia and Former                 Smoker. Breast cancer of upper-outer quadrant of left female                 breast (HCC).  Sonographer:    Denese Finn RCS Referring Phys: [phone] Pamela Henson IMPRESSIONS  1. Left ventricular ejection fraction, by estimation, is 60 to 65%. The left ventricle has normal function. The left ventricle has no regional wall motion abnormalities. There is mild left ventricular hypertrophy. Left ventricular diastolic parameters are indeterminate.  2. Right ventricular systolic function is normal. The right ventricular size is normal. There is normal pulmonary artery systolic pressure.  3. The mitral valve is normal in structure. Mild mitral valve regurgitation. No evidence of mitral stenosis.  4. The tricuspid valve is abnormal.  5. The aortic valve is tricuspid. Aortic valve regurgitation is not visualized. No aortic stenosis is present.  6. The inferior vena cava is dilated in size with >50% respiratory variability, suggesting right atrial pressure of 8 mmHg. FINDINGS  Left Ventricle: Left ventricular ejection fraction, by estimation, is 60 to 65%. The left ventricle has normal function. The left ventricle has no regional wall motion abnormalities. The left ventricular internal cavity size was normal in size. There is  mild left ventricular hypertrophy. Left ventricular diastolic parameters are indeterminate. Right Ventricle: The right ventricular size is normal. Right vetricular wall thickness was not well visualized. Right ventricular systolic function is normal. There is normal pulmonary artery systolic pressure. The tricuspid regurgitant velocity is 2.35 m/s, and with an assumed right atrial pressure of 8 mmHg, the estimated right ventricular systolic pressure is 30.1 mmHg. Left Atrium: Left atrial size was normal in size. Right Atrium: Right atrial size was normal in size. Pericardium: There is no evidence of pericardial  effusion. Mitral Valve: The mitral valve is normal in structure. Mild mitral valve regurgitation. No evidence of mitral valve stenosis. Tricuspid Valve: The tricuspid valve is abnormal. Tricuspid valve regurgitation is mild . No evidence of tricuspid stenosis. Aortic Valve: The aortic valve is tricuspid. Aortic valve regurgitation is not visualized. No aortic stenosis is present. Aortic valve mean gradient measures 3.0 mmHg. Aortic valve peak gradient measures 7.4 mmHg. Aortic valve area, by VTI measures 3.23 cm. Pulmonic Valve: The pulmonic valve was not well visualized. Pulmonic valve regurgitation is not visualized. No evidence of pulmonic stenosis. Aorta: The aortic root is normal in size and structure and the ascending aorta was not well visualized. Venous: The inferior vena cava is dilated in size with greater than 50% respiratory variability, suggesting right atrial pressure of 8 mmHg. IAS/Shunts: No atrial level shunt detected by color flow Doppler. Additional Comments: 3D was performed not requiring image post processing on an independent workstation and was normal.  LEFT VENTRICLE PLAX 2D LVIDd:         4.27 cm   Diastology LVIDs:         2.77 cm   LV e' medial:    5.87 cm/s LV PW:         1.15 cm   LV E/e' medial:  11.8 LV IVS:        1.25 cm   LV e' lateral:   6.64 cm/s LVOT diam:     2.10 cm   LV E/e' lateral: 10.5 LV SV:         63 LV SV Index:   41 LVOT Area:     3.46 cm  3D Volume EF:                          3D EF:        62 %                          LV EDV:       102 ml                          LV ESV:       39 ml                          LV SV:        64 ml RIGHT VENTRICLE RV S prime:     13.65 cm/s TAPSE (M-mode): 1.6 cm LEFT ATRIUM             Index        RIGHT ATRIUM           Index LA diam:        3.40 cm 2.21 cm/m   RA Area:     12.80 cm LA Vol (A2C):   53.0 ml 34.44 ml/m  RA Volume:   26.50 ml  17.22 ml/m LA Vol (A4C):   43.3 ml 28.14 ml/m LA Biplane Vol:  48.5 ml 31.52 ml/m  AORTIC VALVE AV Area (Vmax):    2.84 cm AV Area (Vmean):   3.09 cm AV Area (VTI):     3.23 cm AV Vmax:           135.60 cm/s AV Vmean:          79.531 cm/s AV VTI:            0.196 m AV Peak Grad:      7.4 mmHg AV Mean Grad:      3.0 mmHg LVOT Vmax:         111.00 cm/s LVOT Vmean:        71.000 cm/s LVOT VTI:          0.183 m LVOT/AV VTI ratio: 0.93  AORTA Ao Root diam: 3.40 cm MITRAL VALVE               TRICUSPID VALVE MV Area (PHT): 3.42 cm    TR Peak grad:   22.1 mmHg MV Decel Time: 222 msec    TR Vmax:        235.00 cm/s MV E velocity: 69.50 cm/s MV A velocity: 67.30 cm/s  SHUNTS MV E/A ratio:  1.03        Systemic VTI:  0.18 m                            Systemic Diam: 2.10 cm Armida Lander MD Electronically signed by Armida Lander MD Signature Date/Time: 01/20/2024/11:01:52 AM    Final

## 2024-01-27 ENCOUNTER — Inpatient Hospital Stay

## 2024-01-27 ENCOUNTER — Inpatient Hospital Stay: Attending: Hematology

## 2024-01-27 ENCOUNTER — Inpatient Hospital Stay: Admitting: Dietician

## 2024-01-27 ENCOUNTER — Inpatient Hospital Stay: Admitting: Hematology

## 2024-01-27 VITALS — BP 118/76 | HR 79 | Temp 97.7°F | Resp 16 | Wt 114.4 lb

## 2024-01-27 DIAGNOSIS — Z170 Estrogen receptor positive status [ER+]: Secondary | ICD-10-CM

## 2024-01-27 DIAGNOSIS — Z79899 Other long term (current) drug therapy: Secondary | ICD-10-CM | POA: Insufficient documentation

## 2024-01-27 DIAGNOSIS — M810 Age-related osteoporosis without current pathological fracture: Secondary | ICD-10-CM | POA: Diagnosis not present

## 2024-01-27 DIAGNOSIS — Z5111 Encounter for antineoplastic chemotherapy: Secondary | ICD-10-CM | POA: Insufficient documentation

## 2024-01-27 DIAGNOSIS — Z87891 Personal history of nicotine dependence: Secondary | ICD-10-CM | POA: Insufficient documentation

## 2024-01-27 DIAGNOSIS — Z1731 Human epidermal growth factor receptor 2 positive status: Secondary | ICD-10-CM | POA: Diagnosis not present

## 2024-01-27 DIAGNOSIS — Z5189 Encounter for other specified aftercare: Secondary | ICD-10-CM | POA: Diagnosis not present

## 2024-01-27 DIAGNOSIS — C50412 Malignant neoplasm of upper-outer quadrant of left female breast: Secondary | ICD-10-CM

## 2024-01-27 DIAGNOSIS — Z5112 Encounter for antineoplastic immunotherapy: Secondary | ICD-10-CM | POA: Insufficient documentation

## 2024-01-27 DIAGNOSIS — Z86718 Personal history of other venous thrombosis and embolism: Secondary | ICD-10-CM | POA: Diagnosis not present

## 2024-01-27 DIAGNOSIS — Z8673 Personal history of transient ischemic attack (TIA), and cerebral infarction without residual deficits: Secondary | ICD-10-CM | POA: Insufficient documentation

## 2024-01-27 DIAGNOSIS — R110 Nausea: Secondary | ICD-10-CM | POA: Diagnosis not present

## 2024-01-27 LAB — CBC WITH DIFFERENTIAL/PLATELET
Abs Immature Granulocytes: 0.01 10*3/uL (ref 0.00–0.07)
Basophils Absolute: 0.1 10*3/uL (ref 0.0–0.1)
Basophils Relative: 1 %
Eosinophils Absolute: 0 10*3/uL (ref 0.0–0.5)
Eosinophils Relative: 0 %
HCT: 42.5 % (ref 36.0–46.0)
Hemoglobin: 13.2 g/dL (ref 12.0–15.0)
Immature Granulocytes: 0 %
Lymphocytes Relative: 18 %
Lymphs Abs: 1.2 10*3/uL (ref 0.7–4.0)
MCH: 30.5 pg (ref 26.0–34.0)
MCHC: 31.1 g/dL (ref 30.0–36.0)
MCV: 98.2 fL (ref 80.0–100.0)
Monocytes Absolute: 0.4 10*3/uL (ref 0.1–1.0)
Monocytes Relative: 6 %
Neutro Abs: 4.6 10*3/uL (ref 1.7–7.7)
Neutrophils Relative %: 75 %
Platelets: 258 10*3/uL (ref 150–400)
RBC: 4.33 MIL/uL (ref 3.87–5.11)
RDW: 16.4 % — ABNORMAL HIGH (ref 11.5–15.5)
WBC: 6.3 10*3/uL (ref 4.0–10.5)
nRBC: 0 % (ref 0.0–0.2)

## 2024-01-27 LAB — COMPREHENSIVE METABOLIC PANEL WITH GFR
ALT: 8 U/L (ref 0–44)
AST: 20 U/L (ref 15–41)
Albumin: 3.5 g/dL (ref 3.5–5.0)
Alkaline Phosphatase: 84 U/L (ref 38–126)
Anion gap: 11 (ref 5–15)
BUN: 11 mg/dL (ref 8–23)
CO2: 23 mmol/L (ref 22–32)
Calcium: 8.5 mg/dL — ABNORMAL LOW (ref 8.9–10.3)
Chloride: 104 mmol/L (ref 98–111)
Creatinine, Ser: 0.53 mg/dL (ref 0.44–1.00)
GFR, Estimated: >60 mL/min (ref >60)
Glucose, Bld: 140 mg/dL — ABNORMAL HIGH (ref 70–99)
Potassium: 3.5 mmol/L (ref 3.5–5.1)
Sodium: 138 mmol/L (ref 135–145)
Total Bilirubin: 0.3 mg/dL (ref 0.0–1.2)
Total Protein: 6.3 g/dL — ABNORMAL LOW (ref 6.5–8.1)

## 2024-01-27 LAB — MAGNESIUM: Magnesium: 1.9 mg/dL (ref 1.7–2.4)

## 2024-01-27 MED ORDER — ACETAMINOPHEN 325 MG PO TABS
650.0000 mg | ORAL_TABLET | Freq: Once | ORAL | Status: AC
  Administered 2024-01-27: 650 mg via ORAL
  Filled 2024-01-27: qty 2

## 2024-01-27 MED ORDER — HEPARIN SOD (PORK) LOCK FLUSH 100 UNIT/ML IV SOLN
500.0000 [IU] | Freq: Once | INTRAVENOUS | Status: AC | PRN
  Administered 2024-01-27: 500 [IU]

## 2024-01-27 MED ORDER — SODIUM CHLORIDE 0.9% FLUSH
10.0000 mL | INTRAVENOUS | Status: DC | PRN
  Administered 2024-01-27: 10 mL

## 2024-01-27 MED ORDER — PALONOSETRON HCL INJECTION 0.25 MG/5ML
0.2500 mg | Freq: Once | INTRAVENOUS | Status: AC
  Administered 2024-01-27: 0.25 mg via INTRAVENOUS
  Filled 2024-01-27: qty 5

## 2024-01-27 MED ORDER — SODIUM CHLORIDE 0.9 % IV SOLN
150.0000 mg | Freq: Once | INTRAVENOUS | Status: AC
  Administered 2024-01-27: 150 mg via INTRAVENOUS
  Filled 2024-01-27: qty 150

## 2024-01-27 MED ORDER — DIPHENHYDRAMINE HCL 25 MG PO CAPS
50.0000 mg | ORAL_CAPSULE | Freq: Once | ORAL | Status: AC
  Administered 2024-01-27: 50 mg via ORAL
  Filled 2024-01-27: qty 2

## 2024-01-27 MED ORDER — DEXAMETHASONE SODIUM PHOSPHATE 10 MG/ML IJ SOLN
10.0000 mg | Freq: Once | INTRAMUSCULAR | Status: AC
Start: 2024-01-27 — End: 2024-01-27
  Administered 2024-01-27: 10 mg via INTRAVENOUS
  Filled 2024-01-27: qty 1

## 2024-01-27 MED ORDER — TRASTUZUMAB-ANNS CHEMO 150 MG IV SOLR
6.0000 mg/kg | Freq: Once | INTRAVENOUS | Status: AC
  Administered 2024-01-27: 336 mg via INTRAVENOUS
  Filled 2024-01-27: qty 16

## 2024-01-27 MED ORDER — SODIUM CHLORIDE 0.9 % IV SOLN
324.8000 mg | Freq: Once | INTRAVENOUS | Status: AC
  Administered 2024-01-27: 320 mg via INTRAVENOUS
  Filled 2024-01-27: qty 32

## 2024-01-27 MED ORDER — SODIUM CHLORIDE 0.9 % IV SOLN
60.0000 mg/m2 | Freq: Once | INTRAVENOUS | Status: AC
  Administered 2024-01-27: 94 mg via INTRAVENOUS
  Filled 2024-01-27: qty 9.4

## 2024-01-27 MED ORDER — SODIUM CHLORIDE 0.9 % IV SOLN
INTRAVENOUS | Status: DC
Start: 2024-01-27 — End: 2024-01-27

## 2024-01-27 NOTE — Patient Instructions (Signed)

## 2024-01-27 NOTE — Progress Notes (Signed)
 Patient has been examined by Dr. Ellin Saba. Vital signs and labs have been reviewed by MD - ANC, Creatinine, LFTs, hemoglobin, and platelets are within treatment parameters per M.D. - pt may proceed with treatment.  Primary RN and pharmacy notified.

## 2024-01-27 NOTE — Patient Instructions (Signed)
 [facility] - A DEPT OF [place]  Discharge Instructions: Thank you for choosing [facility] to provide your oncology and hematology care.  If you have a lab appointment with the Cancer Center - please note that after April 8th, 2024, all labs will be drawn in the cancer center.  You do not have to check in or register with the main entrance as you have in the past but will complete your check-in in the cancer center.  Wear comfortable clothing and clothing appropriate for easy access to any Portacath or PICC line.   We strive to give you quality time with your provider. You may need to reschedule your appointment if you arrive late (15 or more minutes).  Arriving late affects you and other patients whose appointments are after yours.  Also, if you miss three or more appointments without notifying the office, you may be dismissed from the clinic at the provider's discretion.      For prescription refill requests, have your pharmacy contact our office and allow 72 hours for refills to be completed.    Today you received the following chemotherapy and/or immunotherapy agents Kanjinti /Docetaxel /Carboplatin     To help prevent nausea and vomiting after your treatment, we encourage you to take your nausea medication as directed.  Trastuzumab  Injection What is this medication? TRASTUZUMAB  (tras TOO zoo mab) treats breast cancer and stomach cancer. It works by blocking a protein that causes cancer cells to grow and multiply. This helps to slow or stop the spread of cancer cells. This medicine may be used for other purposes; ask your health care provider or pharmacist if you have questions. COMMON BRAND NAME(S): Herceptin , HERCESSI, Herzuma , KANJINTI , Ogivri , Ontruzant , Trazimera  What should I tell my care team before I take this medication? They need to know if you have any of these conditions: Heart failure Lung disease An unusual or allergic reaction  to trastuzumab , other medications, foods, dyes, or preservatives Pregnant or trying to get pregnant Breast-feeding How should I use this medication? This medication is injected into a vein. It is given by your care team in a hospital or clinic setting. Talk to your care team about the use of this medication in children. It is not approved for use in children. Overdosage: If you think you have taken too much of this medicine contact a poison control center or emergency room at once. NOTE: This medicine is only for you. Do not share this medicine with others. What if I miss a dose? Keep appointments for follow-up doses. It is important not to miss your dose. Call your care team if you are unable to keep an appointment. What may interact with this medication? Certain types of chemotherapy, such as daunorubicin, doxorubicin, epirubicin, idarubicin This list may not describe all possible interactions. Give your health care provider a list of all the medicines, herbs, non-prescription drugs, or dietary supplements you use. Also tell them if you smoke, drink alcohol, or use illegal drugs. Some items may interact with your medicine. What should I watch for while using this medication? Your condition will be monitored carefully while you are receiving this medication. This medication may make you feel generally unwell. This is not uncommon, as chemotherapy affects healthy cells as well as cancer cells. Report any side effects. Continue your course of treatment even though you feel ill unless your care team tells you to stop. This medication may increase your risk of getting an infection. Call  your care team for advice if you get a fever, chills, sore throat, or other symptoms of a cold or flu. Do not treat yourself. Try to avoid being around people who are sick. Avoid taking medications that contain aspirin, acetaminophen , ibuprofen, naproxen, or ketoprofen unless instructed by your care team. These  medications can hide a fever. Talk to your care team if you may be pregnant. Serious birth defects can occur if you take this medication during pregnancy and for 7 months after the last dose. You will need a negative pregnancy test before starting this medication. Contraception is recommended while taking this medication and for 7 months after the last dose. Your care team can help you find the option that works for you. Do not breastfeed while taking this medication and for 7 months after stopping treatment. What side effects may I notice from receiving this medication? Side effects that you should report to your care team as soon as possible: Allergic reactions or angioedema--skin rash, itching or hives, swelling of the face, eyes, lips, tongue, arms, or legs, trouble swallowing or breathing Dry cough, shortness of breath or trouble breathing Heart failure--shortness of breath, swelling of the ankles, feet, or hands, sudden weight gain, unusual weakness or fatigue Infection--fever, chills, cough, or sore throat Infusion reactions--chest pain, shortness of breath or trouble breathing, feeling faint or lightheaded Side effects that usually do not require medical attention (report to your care team if they continue or are bothersome): Diarrhea Dizziness Headache Nausea Trouble sleeping Vomiting This list may not describe all possible side effects. Call your doctor for medical advice about side effects. You may report side effects to FDA at 1-800-FDA-1088. Where should I keep my medication? This medication is given in a hospital or clinic. It will not be stored at home. NOTE: This sheet is a summary. It may not cover all possible information. If you have questions about this medicine, talk to your doctor, pharmacist, or health care provider.  2024 Elsevier/Gold Standard (2022-01-23 00:00:00)  Docetaxel  Injection What is this medication? DOCETAXEL  (doe se TAX el) treats some types of cancer.  It works by slowing down the growth of cancer cells. This medicine may be used for other purposes; ask your health care provider or pharmacist if you have questions. COMMON BRAND NAME(S): BEIZRAY, Docefrez , Docivyx , Taxotere  What should I tell my care team before I take this medication? They need to know if you have any of these conditions: Kidney disease Liver disease Low white blood cell levels Tingling of the fingers or toes or other nerve disorder An unusual or allergic reaction to docetaxel , polysorbate 80, other medications, foods, dyes, or preservatives Pregnant or trying to get pregnant Breast-feeding How should I use this medication? This medication is injected into a vein. It is given by your care team in a hospital or clinic setting. Talk to your care team about the use of this medication in children. Special care may be needed. Overdosage: If you think you have taken too much of this medicine contact a poison control center or emergency room at once. NOTE: This medicine is only for you. Do not share this medicine with others. What if I miss a dose? Keep appointments for follow-up doses. It is important not to miss your dose. Call your care team if you are unable to keep an appointment. What may interact with this medication? Do not take this medication with any of the following: Live virus vaccines This medication may also interact with the following: Certain  antibiotics, such as clarithromycin, telithromycin Certain antivirals for HIV or hepatitis Certain medications for fungal infections, such as itraconazole, ketoconazole, voriconazole Grapefruit juice Nefazodone Supplements, such as St. John's wort This list may not describe all possible interactions. Give your health care provider a list of all the medicines, herbs, non-prescription drugs, or dietary supplements you use. Also tell them if you smoke, drink alcohol, or use illegal drugs. Some items may interact with your  medicine. What should I watch for while using this medication? This medication may make you feel generally unwell. This is not uncommon as chemotherapy can affect healthy cells as well as cancer cells. Report any side effects. Continue your course of treatment even though you feel ill unless your care team tells you to stop. You may need blood work done while you are taking this medication. This medication can cause serious side effects and infusion reactions. To reduce the risk, your care team may give you other medications to take before receiving this one. Be sure to follow the directions from your care team. This medication may increase your risk of getting an infection. Call your care team for advice if you get a fever, chills, sore throat, or other symptoms of a cold or flu. Do not treat yourself. Try to avoid being around people who are sick. Avoid taking medications that contain aspirin, acetaminophen , ibuprofen, naproxen, or ketoprofen unless instructed by your care team. These medications may hide a fever. Be careful brushing or flossing your teeth or using a toothpick because you may get an infection or bleed more easily. If you have any dental work done, tell your dentist you are receiving this medication. Some products may contain alcohol. Ask your care team if this medication contains alcohol. Be sure to tell all care teams you are taking this medicine. Certain medications, like metronidazole and disulfiram, can cause an unpleasant reaction when taken with alcohol. The reaction includes flushing, headache, nausea, vomiting, sweating, and increased thirst. The reaction can last from 30 minutes to several hours. This medication may affect your coordination, reaction time, or judgement. Do not drive or operate machinery until you know how this medication affects you. Sit up or stand slowly to reduce the risk of dizzy or fainting spells. Drinking alcohol with this medication can increase the risk  of these side effects. Talk to your care team about your risk of cancer. You may be more at risk for certain types of cancer if you take this medication. Talk to your care team if you wish to become pregnant or think you might be pregnant. This medication can cause serious birth defects if taken during pregnancy or if you get pregnant within 2 months after stopping therapy. A negative pregnancy test is required before starting this medication. A reliable form of contraception is recommended while taking this medication and for 2 months after stopping it. Talk to your care team about reliable forms of contraception. Do not breast-feed while taking this medication and for 1 week after stopping therapy. Use a condom during sex and for 4 months after stopping therapy. Tell your care team right away if you think your partner might be pregnant. This medication can cause serious birth defects. This medication may cause infertility. Talk to your care team if you are concerned about your fertility. What side effects may I notice from receiving this medication? Side effects that you should report to your care team as soon as possible: Allergic reactions--skin rash, itching, hives, swelling of the face, lips,  tongue, or throat Change in vision such as blurry vision, seeing halos around lights, vision loss Infection--fever, chills, cough, or sore throat Infusion reactions--chest pain, shortness of breath or trouble breathing, feeling faint or lightheaded Low red blood cell level--unusual weakness or fatigue, dizziness, headache, trouble breathing Pain, tingling, or numbness in the hands or feet Painful swelling, warmth, or redness of the skin, blisters or sores at the infusion site Redness, blistering, peeling, or loosening of the skin, including inside the mouth Sudden or severe stomach pain, bloody diarrhea, fever, nausea, vomiting Swelling of the ankles, hands, or feet Tumor lysis syndrome (TLS)--nausea,  vomiting, diarrhea, decrease in the amount of urine, dark urine, unusual weakness or fatigue, confusion, muscle pain or cramps, fast or irregular heartbeat, joint pain Unusual bruising or bleeding Side effects that usually do not require medical attention (report to your care team if they continue or are bothersome): Change in nail shape, thickness, or color Change in taste Hair loss Increased tears This list may not describe all possible side effects. Call your doctor for medical advice about side effects. You may report side effects to FDA at 1-800-FDA-1088. Where should I keep my medication? This medication is given in a hospital or clinic. It will not be stored at home. NOTE: This sheet is a summary. It may not cover all possible information. If you have questions about this medicine, talk to your doctor, pharmacist, or health care provider.  2024 Elsevier/Gold Standard (2021-11-16 00:00:00)  Carboplatin  Injection What is this medication? CARBOPLATIN  (KAR boe pla tin) treats some types of cancer. It works by slowing down the growth of cancer cells. This medicine may be used for other purposes; ask your health care provider or pharmacist if you have questions. COMMON BRAND NAME(S): Paraplatin  What should I tell my care team before I take this medication? They need to know if you have any of these conditions: Blood disorders Hearing problems Kidney disease Recent or ongoing radiation therapy An unusual or allergic reaction to carboplatin , cisplatin, other medications, foods, dyes, or preservatives Pregnant or trying to get pregnant Breast-feeding How should I use this medication? This medication is injected into a vein. It is given by your care team in a hospital or clinic setting. Talk to your care team about the use of this medication in children. Special care may be needed. Overdosage: If you think you have taken too much of this medicine contact a poison control center or  emergency room at once. NOTE: This medicine is only for you. Do not share this medicine with others. What if I miss a dose? Keep appointments for follow-up doses. It is important not to miss your dose. Call your care team if you are unable to keep an appointment. What may interact with this medication? Medications for seizures Some antibiotics, such as amikacin, gentamicin, neomycin, streptomycin, tobramycin Vaccines This list may not describe all possible interactions. Give your health care provider a list of all the medicines, herbs, non-prescription drugs, or dietary supplements you use. Also tell them if you smoke, drink alcohol, or use illegal drugs. Some items may interact with your medicine. What should I watch for while using this medication? Your condition will be monitored carefully while you are receiving this medication. You may need blood work while taking this medication. This medication may make you feel generally unwell. This is not uncommon, as chemotherapy can affect healthy cells as well as cancer cells. Report any side effects. Continue your course of treatment even though you feel  ill unless your care team tells you to stop. In some cases, you may be given additional medications to help with side effects. Follow all directions for their use. This medication may increase your risk of getting an infection. Call your care team for advice if you get a fever, chills, sore throat, or other symptoms of a cold or flu. Do not treat yourself. Try to avoid being around people who are sick. Avoid taking medications that contain aspirin, acetaminophen , ibuprofen, naproxen, or ketoprofen unless instructed by your care team. These medications may hide a fever. Be careful brushing or flossing your teeth or using a toothpick because you may get an infection or bleed more easily. If you have any dental work done, tell your dentist you are receiving this medication. Talk to your care team if you  wish to become pregnant or think you might be pregnant. This medication can cause serious birth defects. Talk to your care team about effective forms of contraception. Do not breast-feed while taking this medication. What side effects may I notice from receiving this medication? Side effects that you should report to your care team as soon as possible: Allergic reactions--skin rash, itching, hives, swelling of the face, lips, tongue, or throat Infection--fever, chills, cough, sore throat, wounds that don't heal, pain or trouble when passing urine, general feeling of discomfort or being unwell Low red blood cell level--unusual weakness or fatigue, dizziness, headache, trouble breathing Pain, tingling, or numbness in the hands or feet, muscle weakness, change in vision, confusion or trouble speaking, loss of balance or coordination, trouble walking, seizures Unusual bruising or bleeding Side effects that usually do not require medical attention (report to your care team if they continue or are bothersome): Hair loss Nausea Unusual weakness or fatigue Vomiting This list may not describe all possible side effects. Call your doctor for medical advice about side effects. You may report side effects to FDA at 1-800-FDA-1088. Where should I keep my medication? This medication is given in a hospital or clinic. It will not be stored at home. NOTE: This sheet is a summary. It may not cover all possible information. If you have questions about this medicine, talk to your doctor, pharmacist, or health care provider.  2024 Elsevier/Gold Standard (2022-01-02 00:00:00)  BELOW ARE SYMPTOMS THAT SHOULD BE REPORTED IMMEDIATELY: *FEVER GREATER THAN 100.4 F (38 C) OR HIGHER *CHILLS OR SWEATING *NAUSEA AND VOMITING THAT IS NOT CONTROLLED WITH YOUR NAUSEA MEDICATION *UNUSUAL SHORTNESS OF BREATH *UNUSUAL BRUISING OR BLEEDING *URINARY PROBLEMS (pain or burning when urinating, or frequent urination) *BOWEL  PROBLEMS (unusual diarrhea, constipation, pain near the anus) TENDERNESS IN MOUTH AND THROAT WITH OR WITHOUT PRESENCE OF ULCERS (sore throat, sores in mouth, or a toothache) UNUSUAL RASH, SWELLING OR PAIN  UNUSUAL VAGINAL DISCHARGE OR ITCHING   Items with * indicate a potential emergency and should be followed up as soon as possible or go to the Emergency Department if any problems should occur.  Please show the CHEMOTHERAPY ALERT CARD or IMMUNOTHERAPY ALERT CARD at check-in to the Emergency Department and triage nurse.  Should you have questions after your visit or need to cancel or reschedule your appointment, please contact [facility] [place] - A DEPT OF [facility] [phone]  and follow the prompts.  Office hours are 8:00 a.m. to 4:30 p.m. Monday - Friday. Please note that voicemails left after 4:00 p.m. may not be returned until the following business day.  We are closed weekends and  major holidays. You have access to a nurse at all times for urgent questions. Please call the main number to the clinic [phone] and follow the prompts.  For any non-urgent questions, you may also contact your provider using MyChart. We now offer e-Visits for anyone 35 and older to request care online for non-urgent symptoms. For details visit [email or web address].   Also download the MyChart app! Go to the app store, search "MyChart", open the app, select [place], and log in with your MyChart username and password.

## 2024-01-27 NOTE — Progress Notes (Signed)
 Patient presents today for Kanjinti/Docetaxel/Carboplatin infusion. Patient is in satisfactory condition with no new complaints voiced.  Vital signs are stable.  Labs reviewed by Dr. Cheree Cords during the office visit and all labs are within treatment parameters.  We will proceed with treatment per MD orders.   Treatment given today per MD orders. Tolerated infusion without adverse affects. Vital signs stable. No complaints at this time. Discharged from clinic ambulatory in stable condition. Alert and oriented x 3. F/U with [facility] as scheduled.

## 2024-01-27 NOTE — Progress Notes (Signed)
 Nutrition Follow-up:  Pt with malignant neoplasm of left breast, estrogen receptor positive. She is receiving adjuvant carbo/taxol/kanjinti  q21d.   Met with patient in infusion. She reports significant fatigue lasting 2 weeks after treatment. Patient feels great on the third week. Patient experienced mild nausea and a couple episodes of diarrhea. Patient takes antiemetics and antidiarrheals as needed. She drinking room temperature water. Patient tolerates this better than cold.   Medications: reviewed   Labs: glucose 140  Anthropometrics: Wt 114 lb 6.7 oz today - trending down  4/14 - 116 lb 13.5 oz 3/24 - 117 lb 6.4 oz 2/10 - 122 lb 9.2 oz     NUTRITION DIAGNOSIS: Food and nutrition related knowledge deficit ongoing    INTERVENTION:  Encourage high calorie high protein foods for wt stability     MONITORING, EVALUATION, GOAL: wt trends, intake   NEXT VISIT: To be scheduled as needed

## 2024-01-29 ENCOUNTER — Inpatient Hospital Stay

## 2024-01-29 VITALS — BP 101/70 | HR 85 | Temp 98.2°F | Resp 17

## 2024-01-29 DIAGNOSIS — C50412 Malignant neoplasm of upper-outer quadrant of left female breast: Secondary | ICD-10-CM | POA: Diagnosis not present

## 2024-01-29 DIAGNOSIS — Z87891 Personal history of nicotine dependence: Secondary | ICD-10-CM | POA: Diagnosis not present

## 2024-01-29 DIAGNOSIS — R110 Nausea: Secondary | ICD-10-CM | POA: Diagnosis not present

## 2024-01-29 DIAGNOSIS — M810 Age-related osteoporosis without current pathological fracture: Secondary | ICD-10-CM | POA: Diagnosis not present

## 2024-01-29 DIAGNOSIS — Z86718 Personal history of other venous thrombosis and embolism: Secondary | ICD-10-CM | POA: Diagnosis not present

## 2024-01-29 DIAGNOSIS — Z79899 Other long term (current) drug therapy: Secondary | ICD-10-CM | POA: Diagnosis not present

## 2024-01-29 DIAGNOSIS — Z8673 Personal history of transient ischemic attack (TIA), and cerebral infarction without residual deficits: Secondary | ICD-10-CM | POA: Diagnosis not present

## 2024-01-29 DIAGNOSIS — Z5189 Encounter for other specified aftercare: Secondary | ICD-10-CM | POA: Diagnosis not present

## 2024-01-29 DIAGNOSIS — Z5111 Encounter for antineoplastic chemotherapy: Secondary | ICD-10-CM | POA: Diagnosis not present

## 2024-01-29 DIAGNOSIS — Z170 Estrogen receptor positive status [ER+]: Secondary | ICD-10-CM

## 2024-01-29 DIAGNOSIS — Z5112 Encounter for antineoplastic immunotherapy: Secondary | ICD-10-CM | POA: Diagnosis not present

## 2024-01-29 DIAGNOSIS — Z1731 Human epidermal growth factor receptor 2 positive status: Secondary | ICD-10-CM | POA: Diagnosis not present

## 2024-01-29 MED ORDER — PEGFILGRASTIM-CBQV 6 MG/0.6ML ~~LOC~~ SOSY
6.0000 mg | PREFILLED_SYRINGE | Freq: Once | SUBCUTANEOUS | Status: AC
  Administered 2024-01-29: 6 mg via SUBCUTANEOUS
  Filled 2024-01-29: qty 0.6

## 2024-01-29 NOTE — Progress Notes (Signed)
 Patient tolerated Udenyca injection with no complaints voiced.  Site clean and dry with no bruising or swelling noted.  No complaints of pain.  Discharged with vital signs stable and no signs or symptoms of distress noted.

## 2024-01-29 NOTE — Patient Instructions (Signed)
 [facility] [place] - A DEPT OF [facility]  Discharge Instructions: Thank you for choosing [facility] to provide your oncology and hematology care.  If you have a lab appointment with the Cancer Center - please note that after April 8th, 2024, all labs will be drawn in the cancer center.  You do not have to check in or register with the main entrance as you have in the past but will complete your check-in in the cancer center.  Wear comfortable clothing and clothing appropriate for easy access to any Portacath or PICC line.   We strive to give you quality time with your provider. You may need to reschedule your appointment if you arrive late (15 or more minutes).  Arriving late affects you and other patients whose appointments are after yours.  Also, if you miss three or more appointments without notifying the office, you may be dismissed from the clinic at the provider's discretion.      For prescription refill requests, have your pharmacy contact our office and allow 72 hours for refills to be completed.    Today you received the following chemotherapy and/or immunotherapy agents Udenyca.  Pegfilgrastim Injection What is this medication? PEGFILGRASTIM (PEG fil gra stim) lowers the risk of infection in people who are receiving chemotherapy. It works by Systems analyst make more white blood cells, which protects your body from infection. It may also be used to help people who have been exposed to high doses of radiation. This medicine may be used for other purposes; ask your health care provider or pharmacist if you have questions. COMMON BRAND NAME(S): Cherly Hensen, Neulasta, Nyvepria, Stimufend, UDENYCA, UDENYCA ONBODY, Ziextenzo What should I tell my care team before I take this medication? They need to know if you have any of these conditions: Kidney disease Latex allergy Ongoing radiation therapy Sickle cell disease Skin reactions to  acrylic adhesives (On-Body Injector only) An unusual or allergic reaction to pegfilgrastim, filgrastim, other medications, foods, dyes, or preservatives Pregnant or trying to get pregnant Breast-feeding How should I use this medication? This medication is for injection under the skin. If you get this medication at home, you will be taught how to prepare and give the pre-filled syringe or how to use the On-body Injector. Refer to the patient Instructions for Use for detailed instructions. Use exactly as directed. Tell your care team immediately if you suspect that the On-body Injector may not have performed as intended or if you suspect the use of the On-body Injector resulted in a missed or partial dose. It is important that you put your used needles and syringes in a special sharps container. Do not put them in a trash can. If you do not have a sharps container, call your pharmacist or care team to get one. Talk to your care team about the use of this medication in children. While this medication may be prescribed for selected conditions, precautions do apply. Overdosage: If you think you have taken too much of this medicine contact a poison control center or emergency room at once. NOTE: This medicine is only for you. Do not share this medicine with others. What if I miss a dose? It is important not to miss your dose. Call your care team if you miss your dose. If you miss a dose due to an On-body Injector failure or leakage, a new dose should be administered as soon as possible using a single prefilled syringe for  manual use. What may interact with this medication? Interactions have not been studied. This list may not describe all possible interactions. Give your health care provider a list of all the medicines, herbs, non-prescription drugs, or dietary supplements you use. Also tell them if you smoke, drink alcohol, or use illegal drugs. Some items may interact with your medicine. What should I  watch for while using this medication? Your condition will be monitored carefully while you are receiving this medication. You may need blood work done while you are taking this medication. Talk to your care team about your risk of cancer. You may be more at risk for certain types of cancer if you take this medication. If you are going to need a MRI, CT scan, or other procedure, tell your care team that you are using this medication (On-Body Injector only). What side effects may I notice from receiving this medication? Side effects that you should report to your care team as soon as possible: Allergic reactions--skin rash, itching, hives, swelling of the face, lips, tongue, or throat Capillary leak syndrome--stomach or muscle pain, unusual weakness or fatigue, feeling faint or lightheaded, decrease in the amount of urine, swelling of the ankles, hands, or feet, trouble breathing High white blood cell level--fever, fatigue, trouble breathing, night sweats, change in vision, weight loss Inflammation of the aorta--fever, fatigue, back, chest, or stomach pain, severe headache Kidney injury (glomerulonephritis)--decrease in the amount of urine, red or dark Adron Geisel urine, foamy or bubbly urine, swelling of the ankles, hands, or feet Shortness of breath or trouble breathing Spleen injury--pain in upper left stomach or shoulder Unusual bruising or bleeding Side effects that usually do not require medical attention (report to your care team if they continue or are bothersome): Bone pain Pain in the hands or feet This list may not describe all possible side effects. Call your doctor for medical advice about side effects. You may report side effects to FDA at 1-800-FDA-1088. Where should I keep my medication? Keep out of the reach of children. If you are using this medication at home, you will be instructed on how to store it. Throw away any unused medication after the expiration date on the label. NOTE:  This sheet is a summary. It may not cover all possible information. If you have questions about this medicine, talk to your doctor, pharmacist, or health care provider.  2024 Elsevier/Gold Standard (2021-08-11 00:00:00)       To help prevent nausea and vomiting after your treatment, we encourage you to take your nausea medication as directed.  BELOW ARE SYMPTOMS THAT SHOULD BE REPORTED IMMEDIATELY: *FEVER GREATER THAN 100.4 F (38 C) OR HIGHER *CHILLS OR SWEATING *NAUSEA AND VOMITING THAT IS NOT CONTROLLED WITH YOUR NAUSEA MEDICATION *UNUSUAL SHORTNESS OF BREATH *UNUSUAL BRUISING OR BLEEDING *URINARY PROBLEMS (pain or burning when urinating, or frequent urination) *BOWEL PROBLEMS (unusual diarrhea, constipation, pain near the anus) TENDERNESS IN MOUTH AND THROAT WITH OR WITHOUT PRESENCE OF ULCERS (sore throat, sores in mouth, or a toothache) UNUSUAL RASH, SWELLING OR PAIN  UNUSUAL VAGINAL DISCHARGE OR ITCHING   Items with * indicate a potential emergency and should be followed up as soon as possible or go to the Emergency Department if any problems should occur.  Please show the CHEMOTHERAPY ALERT CARD or IMMUNOTHERAPY ALERT CARD at check-in to the Emergency Department and triage nurse.  Should you have questions after your visit or need to cancel or reschedule your appointment, please contact [facility] [place] -  A DEPT OF [facility] [phone]  and follow the prompts.  Office hours are 8:00 a.m. to 4:30 p.m. Monday - Friday. Please note that voicemails left after 4:00 p.m. may not be returned until the following business day.  We are closed weekends and major holidays. You have access to a nurse at all times for urgent questions. Please call the main number to the clinic [phone] and follow the prompts.  For any non-urgent questions, you may also contact your provider using MyChart. We now offer e-Visits for anyone 58 and older to request care online for  non-urgent symptoms. For details visit [email or web address].   Also download the MyChart app! Go to the app store, search "MyChart", open the app, select [employer], and log in with your MyChart username and password.

## 2024-02-14 ENCOUNTER — Encounter: Payer: Self-pay | Admitting: Hematology

## 2024-02-18 ENCOUNTER — Inpatient Hospital Stay

## 2024-02-18 ENCOUNTER — Inpatient Hospital Stay: Admitting: Hematology

## 2024-02-18 VITALS — BP 140/73 | HR 75 | Temp 98.1°F | Resp 18

## 2024-02-18 DIAGNOSIS — Z1731 Human epidermal growth factor receptor 2 positive status: Secondary | ICD-10-CM | POA: Diagnosis not present

## 2024-02-18 DIAGNOSIS — C50412 Malignant neoplasm of upper-outer quadrant of left female breast: Secondary | ICD-10-CM | POA: Diagnosis not present

## 2024-02-18 DIAGNOSIS — Z87891 Personal history of nicotine dependence: Secondary | ICD-10-CM | POA: Diagnosis not present

## 2024-02-18 DIAGNOSIS — R110 Nausea: Secondary | ICD-10-CM | POA: Diagnosis not present

## 2024-02-18 DIAGNOSIS — Z79899 Other long term (current) drug therapy: Secondary | ICD-10-CM | POA: Diagnosis not present

## 2024-02-18 DIAGNOSIS — Z5189 Encounter for other specified aftercare: Secondary | ICD-10-CM | POA: Diagnosis not present

## 2024-02-18 DIAGNOSIS — Z5111 Encounter for antineoplastic chemotherapy: Secondary | ICD-10-CM | POA: Diagnosis not present

## 2024-02-18 DIAGNOSIS — Z5112 Encounter for antineoplastic immunotherapy: Secondary | ICD-10-CM | POA: Diagnosis not present

## 2024-02-18 DIAGNOSIS — Z86718 Personal history of other venous thrombosis and embolism: Secondary | ICD-10-CM | POA: Diagnosis not present

## 2024-02-18 DIAGNOSIS — Z170 Estrogen receptor positive status [ER+]: Secondary | ICD-10-CM

## 2024-02-18 DIAGNOSIS — M810 Age-related osteoporosis without current pathological fracture: Secondary | ICD-10-CM | POA: Diagnosis not present

## 2024-02-18 DIAGNOSIS — Z8673 Personal history of transient ischemic attack (TIA), and cerebral infarction without residual deficits: Secondary | ICD-10-CM | POA: Diagnosis not present

## 2024-02-18 LAB — CBC WITH DIFFERENTIAL/PLATELET
Abs Immature Granulocytes: 0.03 10*3/uL (ref 0.00–0.07)
Basophils Absolute: 0.1 10*3/uL (ref 0.0–0.1)
Basophils Relative: 1 %
Eosinophils Absolute: 0 10*3/uL (ref 0.0–0.5)
Eosinophils Relative: 0 %
HCT: 43.3 % (ref 36.0–46.0)
Hemoglobin: 13.8 g/dL (ref 12.0–15.0)
Immature Granulocytes: 0 %
Lymphocytes Relative: 13 %
Lymphs Abs: 1 10*3/uL (ref 0.7–4.0)
MCH: 31.5 pg (ref 26.0–34.0)
MCHC: 31.9 g/dL (ref 30.0–36.0)
MCV: 98.9 fL (ref 80.0–100.0)
Monocytes Absolute: 0.5 10*3/uL (ref 0.1–1.0)
Monocytes Relative: 6 %
Neutro Abs: 6.3 10*3/uL (ref 1.7–7.7)
Neutrophils Relative %: 80 %
Platelets: 220 10*3/uL (ref 150–400)
RBC: 4.38 MIL/uL (ref 3.87–5.11)
RDW: 16.1 % — ABNORMAL HIGH (ref 11.5–15.5)
WBC: 8 10*3/uL (ref 4.0–10.5)
nRBC: 0 % (ref 0.0–0.2)

## 2024-02-18 LAB — COMPREHENSIVE METABOLIC PANEL WITH GFR
ALT: 9 U/L (ref 0–44)
AST: 20 U/L (ref 15–41)
Albumin: 3.5 g/dL (ref 3.5–5.0)
Alkaline Phosphatase: 80 U/L (ref 38–126)
Anion gap: 9 (ref 5–15)
BUN: 12 mg/dL (ref 8–23)
CO2: 25 mmol/L (ref 22–32)
Calcium: 9 mg/dL (ref 8.9–10.3)
Chloride: 106 mmol/L (ref 98–111)
Creatinine, Ser: 0.49 mg/dL (ref 0.44–1.00)
GFR, Estimated: >60 mL/min (ref >60)
Glucose, Bld: 99 mg/dL (ref 70–99)
Potassium: 4.3 mmol/L (ref 3.5–5.1)
Sodium: 140 mmol/L (ref 135–145)
Total Bilirubin: 0.4 mg/dL (ref 0.0–1.2)
Total Protein: 6.4 g/dL — ABNORMAL LOW (ref 6.5–8.1)

## 2024-02-18 LAB — MAGNESIUM: Magnesium: 2 mg/dL (ref 1.7–2.4)

## 2024-02-18 MED ORDER — SODIUM CHLORIDE 0.9 % IV SOLN
324.8000 mg | Freq: Once | INTRAVENOUS | Status: AC
  Administered 2024-02-18: 320 mg via INTRAVENOUS
  Filled 2024-02-18: qty 32

## 2024-02-18 MED ORDER — SODIUM CHLORIDE 0.9 % IV SOLN: INTRAVENOUS | Status: DC

## 2024-02-18 MED ORDER — TRASTUZUMAB-ANNS CHEMO 150 MG IV SOLR
6.0000 mg/kg | Freq: Once | INTRAVENOUS | Status: AC
  Administered 2024-02-18: 336 mg via INTRAVENOUS
  Filled 2024-02-18: qty 16

## 2024-02-18 MED ORDER — DEXAMETHASONE SODIUM PHOSPHATE 10 MG/ML IJ SOLN
10.0000 mg | Freq: Once | INTRAMUSCULAR | Status: AC
  Administered 2024-02-18: 10 mg via INTRAVENOUS
  Filled 2024-02-18: qty 1

## 2024-02-18 MED ORDER — PALONOSETRON HCL INJECTION 0.25 MG/5ML
0.2500 mg | Freq: Once | INTRAVENOUS | Status: AC
  Administered 2024-02-18: 0.25 mg via INTRAVENOUS
  Filled 2024-02-18: qty 5

## 2024-02-18 MED ORDER — HEPARIN SOD (PORK) LOCK FLUSH 100 UNIT/ML IV SOLN
500.0000 [IU] | Freq: Once | INTRAVENOUS | Status: AC | PRN
  Administered 2024-02-18: 500 [IU]

## 2024-02-18 MED ORDER — DIPHENHYDRAMINE HCL 25 MG PO CAPS
50.0000 mg | ORAL_CAPSULE | Freq: Once | ORAL | Status: AC
  Administered 2024-02-18: 50 mg via ORAL
  Filled 2024-02-18: qty 2

## 2024-02-18 MED ORDER — SODIUM CHLORIDE 0.9% FLUSH
10.0000 mL | Freq: Once | INTRAVENOUS | Status: AC
  Administered 2024-02-18: 10 mL via INTRAVENOUS

## 2024-02-18 MED ORDER — ACETAMINOPHEN 325 MG PO TABS
650.0000 mg | ORAL_TABLET | Freq: Once | ORAL | Status: AC
  Administered 2024-02-18: 650 mg via ORAL
  Filled 2024-02-18: qty 2

## 2024-02-18 MED ORDER — SODIUM CHLORIDE 0.9 % IV SOLN
60.0000 mg/m2 | Freq: Once | INTRAVENOUS | Status: AC
  Administered 2024-02-18: 94 mg via INTRAVENOUS
  Filled 2024-02-18: qty 9.4

## 2024-02-18 MED ORDER — SODIUM CHLORIDE 0.9 % IV SOLN
150.0000 mg | Freq: Once | INTRAVENOUS | Status: AC
  Administered 2024-02-18: 150 mg via INTRAVENOUS
  Filled 2024-02-18: qty 150

## 2024-02-18 NOTE — Patient Instructions (Signed)
 [facility] [place] - A DEPT OF [facility]  Discharge Instructions: Thank you for choosing [facility] to provide your oncology and hematology care.  If you have a lab appointment with the Cancer Center - please note that after April 8th, 2024, all labs will be drawn in the cancer center.  You do not have to check in or register with the main entrance as you have in the past but will complete your check-in in the cancer center.  Wear comfortable clothing and clothing appropriate for easy access to any Portacath or PICC line.   We strive to give you quality time with your provider. You may need to reschedule your appointment if you arrive late (15 or more minutes).  Arriving late affects you and other patients whose appointments are after yours.  Also, if you miss three or more appointments without notifying the office, you may be dismissed from the clinic at the provider's discretion.      For prescription refill requests, have your pharmacy contact our office and allow 72 hours for refills to be completed.    Today you received the following chemotherapy and/or immunotherapy agents Taxotere , kanjiniti, carboplatin    To help prevent nausea and vomiting after your treatment, we encourage you to take your nausea medication as directed.  BELOW ARE SYMPTOMS THAT SHOULD BE REPORTED IMMEDIATELY: *FEVER GREATER THAN 100.4 F (38 C) OR HIGHER *CHILLS OR SWEATING *NAUSEA AND VOMITING THAT IS NOT CONTROLLED WITH YOUR NAUSEA MEDICATION *UNUSUAL SHORTNESS OF BREATH *UNUSUAL BRUISING OR BLEEDING *URINARY PROBLEMS (pain or burning when urinating, or frequent urination) *BOWEL PROBLEMS (unusual diarrhea, constipation, pain near the anus) TENDERNESS IN MOUTH AND THROAT WITH OR WITHOUT PRESENCE OF ULCERS (sore throat, sores in mouth, or a toothache) UNUSUAL RASH, SWELLING OR PAIN  UNUSUAL VAGINAL DISCHARGE OR ITCHING   Items with * indicate a potential emergency and  should be followed up as soon as possible or go to the Emergency Department if any problems should occur.  Please show the CHEMOTHERAPY ALERT CARD or IMMUNOTHERAPY ALERT CARD at check-in to the Emergency Department and triage nurse.  Should you have questions after your visit or need to cancel or reschedule your appointment, please contact [facility] [place] - A DEPT OF [facility] [phone]  and follow the prompts.  Office hours are 8:00 a.m. to 4:30 p.m. Monday - Friday. Please note that voicemails left after 4:00 p.m. may not be returned until the following business day.  We are closed weekends and major holidays. You have access to a nurse at all times for urgent questions. Please call the main number to the clinic [phone] and follow the prompts.  For any non-urgent questions, you may also contact your provider using MyChart. We now offer e-Visits for anyone 12 and older to request care online for non-urgent symptoms. For details visit [email or web address].   Also download the MyChart app! Go to the app store, search "MyChart", open the app, select [place], and log in with your MyChart username and password.

## 2024-02-18 NOTE — Progress Notes (Signed)
 [facility] [address] [place] [address]    Clinic Day:  02/18/2024  Referring physician: Cook, Jayce G, DO  Patient Care Team: Cook, Jayce G, DO as PCP - General (Family Medicine) [name], MD as Consulting Physician (Psychiatry) Terresa Ferry, NP as Nurse Practitioner (Psychiatry) Paulett Boros, MD as Medical Oncologist (Medical Oncology) Gerhard Knuckles, RN as Oncology Nurse Navigator (Medical Oncology)   ASSESSMENT & PLAN:   Assessment: 1.  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 - Bilateral diagnostic mammogram/ultrasound (08/27/2023): 2.1 cm mass in the left breast 3 o'clock position.  No lymphadenopathy in the left axilla. 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  Ki-67 5%.  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 - Left breast lumpectomy and SLNB (09/26/2023) by Dr. Bridges - Pathology: 2.4 cm grade 2 IDC, margins negative, negative LVI/PNI.  0/3 lymph nodes involved with cancer.  pT2 pN0. - Cycle 1 of docetaxel , carboplatin  and Herceptin  (TCH) on 11/04/2023.   2.  Social/family history: - Lives at home by herself and is independent of ADLs and IADLs.  She retired after working at [employer].  She quit smoking 1 year ago.  Smoked half pack per day on and off since age 40.  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 - Paternal aunt died of cancer.  Type not known to the patient.   3.  History of stroke and DVT: - CVA at age 55.  DVT in March 2024 after fracture, treated with Eliquis and was taken off due to side effects.    Plan: 1.  T2 N0 G2 left breast ER/HER2 positive IDC: - She has completed cycle 5 of chemotherapy on 01/27/2024. - She had 2 days of vomiting, which started 2 days after the G-CSF injection.  She had decrease in energy levels for lasting about 2 weeks. - Reviewed  labs today: Normal LFTs and creatinine.  CBC grossly normal. - Proceed with cycle 6 of chemotherapy today.  She will continue Herceptin  every 3 weeks for total of 1 year. - I will make a referral to radiation oncology. - She will come back in 6 weeks for follow-up.  Will plan to start her on aromatase inhibitor therapy.   2.  Osteoporosis (DEXA 07/31/2023 T -4.3): - Received first Reclast  on 10/16/2023.  Will check vitamin D  levels after completion of chemotherapy.   3.  History of stroke and DVT: - She is not on any anticoagulation at this time.  Will consider antiphospholipid syndrome testing after chemo completed.   4.  High risk drug monitoring: - 2D echo from 01/20/2024: LVEF 60 to 65%.  No signs of PND or orthopnea.    Orders Placed This Encounter  Procedures   Magnesium    Standing Status:   Future    Expected Date:   06/02/2024    Expiration Date:   06/02/2025   Magnesium    Standing Status:   Future    Expected Date:   06/23/2024    Expiration Date:   06/23/2025   Magnesium    Standing Status:   Future    Expected Date:   07/14/2024    Expiration Date:   07/14/2025   Magnesium    Standing Status:   Future    Expected Date:   08/04/2024  Expiration Date:   08/04/2025   Magnesium    Standing Status:   Future    Expected Date:   08/25/2024    Expiration Date:   08/25/2025   Magnesium    Standing Status:   Future    Expected Date:   09/15/2024    Expiration Date:   09/15/2025   Magnesium    Standing Status:   Future    Expected Date:   10/06/2024    Expiration Date:   10/06/2025      Hurman Maiden R Teague,acting as a scribe for Paulett Boros, MD.,have documented all relevant documentation on the behalf of Paulett Boros, MD,as directed by  Paulett Boros, MD while in the presence of Paulett Boros, MD.  I, Paulett Boros MD, have reviewed the above documentation for accuracy and completeness, and I agree with the above.    Paulett Boros, MD    5/27/20252:29 PM  CHIEF COMPLAINT:   Diagnosis:  left breast cancer, ER+/HER2+    Cancer Staging  Breast cancer of upper-outer quadrant of left female breast [facility]) Staging form: Breast, AJCC 8th Edition - Clinical stage from 09/03/2023: cT2, cN0, cM0, G2, PR-, HER2+ - Unsigned    Prior Therapy: left lumpectomy, 09/26/23   Current Therapy:  TCH   HISTORY OF PRESENT ILLNESS:   Oncology History  Breast cancer of upper-outer quadrant of left female breast (HCC)  09/03/2023 Initial Diagnosis   Breast cancer of upper-outer quadrant of left female breast (HCC)   11/04/2023 -  Chemotherapy   Patient is on Treatment Plan : BREAST Docetaxel  + Carboplatin  + Trastuzumab  (TCH) q21d / Trastuzumab  q21d        INTERVAL HISTORY:   Anja is a 73 y.o. female presenting to clinic today for follow up of left breast cancer, ER+/HER2+. She was last seen by me on 01/27/24.  Today, she states that she is doing well overall. Her appetite level is at 30%. Her energy level is at 20%. Ciani reports decreased energy levels lasting 2 weeks after treatment and vomiting for 2 days a few days after treatment. She was unable to eat anything during this emesis episode and she was unable to take nausea medication.   PAST MEDICAL HISTORY:   Past Medical History: Past Medical History:  Diagnosis Date   Anemia    Bipolar 1 disorder (HCC)    Bipolar 1 disorder (HCC)    Complication of anesthesia    Headache    History of chicken pox    History of measles    History of mumps    PONV (postoperative nausea and vomiting)    Stroke (HCC) 1975   Urine retention    takes Flomax     Surgical History: Past Surgical History:  Procedure Laterality Date   ABDOMINAL HYSTERECTOMY  09/24/1977   age 47 due to vaginal bleeding   BREAST BIOPSY Left 08/30/2023   US  LT BREAST BX W LOC DEV 1ST LESION IMG BX SPEC US  GUIDE 08/30/2023 [facility] MAMMOGRAPHY   COLONOSCOPY N/A 10/30/2017   Procedure: COLONOSCOPY;  Surgeon: Ruby Corporal, MD;  Location: [facility];  Service: Endoscopy;  Laterality: N/A;  1225   COLONOSCOPY WITH PROPOFOL  N/A 10/13/2022   Procedure: COLONOSCOPY WITH PROPOFOL ;  Surgeon: Urban Garden, MD;  Location: [facility];  Service: Gastroenterology;  Laterality: N/A;   ESOPHAGOGASTRODUODENOSCOPY (EGD) WITH PROPOFOL  N/A 10/13/2022   Procedure: ESOPHAGOGASTRODUODENOSCOPY (EGD) WITH PROPOFOL ;  Surgeon: Urban Garden, MD;  Location: [facility];  Service: Gastroenterology;  Laterality: N/A;   ORIF TIBIA PLATEAU Right 12/12/2022   Procedure: OPEN REDUCTION INTERNAL FIXATION (ORIF) TIBIAL PLATEAU;  Surgeon: Laneta Pintos, MD;  Location: [facility] OR;  Service: Orthopedics;  Laterality: Right;   PARTIAL MASTECTOMY WITH AXILLARY SENTINEL LYMPH NODE BIOPSY Left 09/26/2023   Procedure: PARTIAL MASTECTOMY WITH AXILLARY SENTINEL LYMPH NODE BIOPSY;  Surgeon: Awilda Bogus, MD;  Location: [facility] ORS;  Service: General;  Laterality: Left;   PERIPHERAL VASCULAR THROMBECTOMY Right 11/2022   2 DVT following surgical intervention of fracture   POLYPECTOMY  10/13/2022   Procedure: POLYPECTOMY;  Surgeon: Urban Garden, MD;  Location: [facility];  Service: Gastroenterology;;   PORTACATH PLACEMENT Right 11/01/2023   Procedure: INSERTION PORT-A-CATH;  Surgeon: Awilda Bogus, MD;  Location: [facility] OR[facility];  Service: General;  Laterality: Right;    Social History: Social History   Socioeconomic History   Marital status: Widowed    Spouse name: Not on file   Number of children: 1   Years of education: Not on file   Highest education level: Some college, no degree  Occupational History   Occupation: Disablity    Comment: Due to back problems  Tobacco Use   Smoking status: Former    Current packs/day: 0.00    Average packs/day: 1 pack/day for 15.0 years (15.0 ttl pk-yrs)    Types: Cigarettes    Start date: 09/25/1983    Quit date: 09/24/1998    Years since quitting: 25.4    Smokeless tobacco: Never  Vaping Use   Vaping status: Never Used  Substance and Sexual Activity   Alcohol use: No    Alcohol/week: 0.0 standard drinks of alcohol   Drug use: No   Sexual activity: Not on file  Other Topics Concern   Not on file  Social History Narrative   Not on file   Social Drivers of Health   Financial Resource Strain: Low Risk  (08/16/2023)   Overall Financial Resource Strain (CARDIA)    Difficulty of Paying Living Expenses: Not hard at all  Food Insecurity: No Food Insecurity (08/16/2023)   Hunger Vital Sign    Worried About Running Out of Food in the Last Year: Never true    Ran Out of Food in the Last Year: Never true  Transportation Needs: No Transportation Needs (08/16/2023)   PRAPARE - Administrator, Civil Service (Medical): No    Lack of Transportation (Non-Medical): No  Physical Activity: Inactive (08/16/2023)   Exercise Vital Sign    Days of Exercise per Week: 0 days    Minutes of Exercise per Session: 30 min  Stress: Stress Concern Present (08/16/2023)   [employer] of Occupational Health - Occupational Stress Questionnaire    Feeling of Stress : To some extent  Social Connections: Moderately Integrated (08/16/2023)   Social Connection and Isolation Panel [NHANES]    Frequency of Communication with Friends and Family: More than three times a week    Frequency of Social Gatherings with Friends and Family: More than three times a week    Attends Religious Services: 1 to 4 times per year    Active Member of [employer] or Organizations: No    Attends Engineer, structural: More than 4 times per year    Marital Status: Widowed  Intimate Partner Violence: Not At Risk (05/10/2023)   Humiliation, Afraid, Rape, and Kick questionnaire    Fear of Current or Ex-Partner: No    Emotionally Abused: No    Physically  Abused: No    Sexually Abused: No    Family History: Family History  Problem Relation Age of Onset   Stroke Mother 40    Stroke Father 5   Colon cancer Neg Hx     Current Medications:  Current Outpatient Medications:    acetaminophen  (TYLENOL ) 500 MG tablet, Take 500 mg by mouth every 6 (six) hours as needed for mild pain, moderate pain or headache., Disp: , Rfl:    calcium -vitamin D  (OSCAL WITH D) 500-5 MG-MCG tablet, Take 1 tablet by mouth 2 (two) times daily with a meal., Disp: 180 tablet, Rfl: 1   CARBOPLATIN  IV, Inject into the vein., Disp: , Rfl:    Cholecalciferol  (VITAMIN D ) 50 MCG (2000 UT) tablet, Take 2,000 Units by mouth daily., Disp: , Rfl:    DOCEtaxel  (TAXOTERE  IV), Inject into the vein., Disp: , Rfl:    gabapentin  (NEURONTIN ) 100 MG capsule, Take 100 mg by mouth 3 (three) times daily., Disp: , Rfl:    lidocaine -prilocaine  (EMLA ) cream, Apply to affected area once, Disp: 30 g, Rfl: 3   melatonin 5 MG TABS, Take 5 mg by mouth at bedtime as needed (sleep)., Disp: , Rfl:    Multiple Vitamins-Minerals (MULTIVITAMIN WITH MINERALS) tablet, Take 1 tablet by mouth daily., Disp: , Rfl:    prochlorperazine  (COMPAZINE ) 10 MG tablet, Take 1 tablet (10 mg total) by mouth every 6 (six) hours as needed for nausea or vomiting., Disp: 60 tablet, Rfl: 3   rosuvastatin  (CRESTOR ) 5 MG tablet, Take 1 tablet (5 mg total) by mouth daily., Disp: 90 tablet, Rfl: 3   sertraline  (ZOLOFT ) 50 MG tablet, Take 50 mg by mouth at bedtime., Disp: , Rfl:    tamsulosin  (FLOMAX ) 0.4 MG CAPS capsule, Take 1 capsule (0.4 mg total) by mouth at bedtime., Disp: 90 capsule, Rfl: 3   traMADol  (ULTRAM ) 50 MG tablet, Take 1 tablet (50 mg total) by mouth every 6 (six) hours as needed for severe pain (pain score 7-10)., Disp: 5 tablet, Rfl: 0   Trastuzumab -anns (KANJINTI  IV), Inject into the vein., Disp: , Rfl:  No current facility-administered medications for this visit.  Facility-Administered Medications Ordered in Other Visits:    0.9 %  sodium chloride  infusion, , Intravenous, Continuous, Paulett Boros, MD, Last Rate: 10 mL/hr  at 02/18/24 1104, New Bag at 02/18/24 1104   CARBOplatin  (PARAPLATIN ) 320 mg in sodium chloride  0.9 % 100 mL chemo infusion, 320 mg, Intravenous, Once, Paulett Boros, MD, Last Rate: 264 mL/hr at 02/18/24 1412, 320 mg at 02/18/24 1412   heparin  lock flush 100 unit/mL, 500 Units, Intracatheter, Once PRN, Ripley Lovecchio, MD   Allergies: Allergies  Allergen Reactions   Asa [Aspirin] Other (See Comments)    Had GI bleed 09/2022   Eliquis [Apixaban]     Severe headaches    Oxycodone      Agitation, caused bipolar episode     REVIEW OF SYSTEMS:   Review of Systems  Constitutional:  Negative for chills, fatigue and fever.  HENT:   Negative for lump/mass, mouth sores, nosebleeds, sore throat and trouble swallowing.   Eyes:  Positive for eye problems (blurry vision).  Respiratory:  Negative for cough and shortness of breath.   Cardiovascular:  Negative for chest pain, leg swelling and palpitations.  Gastrointestinal:  Positive for nausea and vomiting. Negative for abdominal pain, constipation and diarrhea.  Genitourinary:  Negative for bladder incontinence, difficulty urinating, dysuria, frequency, hematuria and nocturia.   Musculoskeletal:  Negative for arthralgias, back pain,  flank pain, myalgias and neck pain.  Skin:  Negative for itching and rash.  Neurological:  Negative for dizziness, headaches and numbness.  Hematological:  Does not bruise/bleed easily.  Psychiatric/Behavioral:  Negative for depression, sleep disturbance and suicidal ideas. The patient is not nervous/anxious.   All other systems reviewed and are negative.    VITALS:   There were no vitals taken for this visit.  Wt Readings from Last 3 Encounters:  02/18/24 113 lb 8.6 oz (51.5 kg)  01/27/24 114 lb 6.7 oz (51.9 kg)  01/27/24 129 lb 13.6 oz (58.9 kg)    There is no height or weight on file to calculate BMI.  Performance status (ECOG): 1 - Symptomatic but completely ambulatory  PHYSICAL EXAM:    Physical Exam Vitals and nursing note reviewed. Exam conducted with a chaperone present.  Constitutional:      Appearance: Normal appearance.  Cardiovascular:     Rate and Rhythm: Normal rate and regular rhythm.     Pulses: Normal pulses.     Heart sounds: Normal heart sounds.  Pulmonary:     Effort: Pulmonary effort is normal.     Breath sounds: Normal breath sounds.  Abdominal:     Palpations: Abdomen is soft. There is no hepatomegaly, splenomegaly or mass.     Tenderness: There is no abdominal tenderness.  Musculoskeletal:     Right lower leg: No edema.     Left lower leg: No edema.  Lymphadenopathy:     Cervical: No cervical adenopathy.     Right cervical: No superficial, deep or posterior cervical adenopathy.    Left cervical: No superficial, deep or posterior cervical adenopathy.     Upper Body:     Right upper body: No supraclavicular or axillary adenopathy.     Left upper body: No supraclavicular or axillary adenopathy.  Neurological:     General: No focal deficit present.     Mental Status: She is alert and oriented to person, place, and time.  Psychiatric:        Mood and Affect: Mood normal.        Behavior: Behavior normal.     LABS:   CBC     Component Value Date/Time   WBC 8.0 02/18/2024 0921   RBC 4.38 02/18/2024 0921   HGB 13.8 02/18/2024 0921   HGB 15.0 08/20/2023 1122   HCT 43.3 02/18/2024 0921   HCT 46.4 08/20/2023 1122   PLT 220 02/18/2024 0921   PLT 228 08/20/2023 1122   MCV 98.9 02/18/2024 0921   MCV 95 08/20/2023 1122   MCH 31.5 02/18/2024 0921   MCHC 31.9 02/18/2024 0921   RDW 16.1 (H) 02/18/2024 0921   RDW 12.6 08/20/2023 1122   LYMPHSABS 1.0 02/18/2024 0921   MONOABS 0.5 02/18/2024 0921   EOSABS 0.0 02/18/2024 0921   BASOSABS 0.1 02/18/2024 0921    CMP      Component Value Date/Time   NA 140 02/18/2024 0921   NA 147 (H) 08/20/2023 1122   K 4.3 02/18/2024 0921   CL 106 02/18/2024 0921   CO2 25 02/18/2024 0921   GLUCOSE  99 02/18/2024 0921   BUN 12 02/18/2024 0921   BUN 12 08/20/2023 1122   CREATININE 0.49 02/18/2024 0921   CALCIUM  9.0 02/18/2024 0921   PROT 6.4 (L) 02/18/2024 0921   PROT 6.5 08/20/2023 1122   ALBUMIN 3.5 02/18/2024 0921   ALBUMIN 4.1 08/20/2023 1122   AST 20 02/18/2024 0921   ALT 9 02/18/2024  0921   ALKPHOS 80 02/18/2024 0921   BILITOT 0.4 02/18/2024 0921   BILITOT <0.2 08/20/2023 1122   GFRNONAA >60 02/18/2024 0921   GFRAA >60 05/29/2016 1145     No results found for: "CEA1", "CEA" / No results found for: "CEA1", "CEA" No results found for: "PSA1" No results found for: "IHK742" No results found for: "CAN125"  No results found for: "TOTALPROTELP", "ALBUMINELP", "A1GS", "A2GS", "BETS", "BETA2SER", "GAMS", "MSPIKE", "SPEI" No results found for: "TIBC", "FERRITIN", "IRONPCTSAT" No results found for: "LDH"   STUDIES:   ECHOCARDIOGRAM COMPLETE Result Date: 01/20/2024    ECHOCARDIOGRAM REPORT   Patient Name:   [facility] Date of Exam: 01/20/2024 Medical Rec #:  [id number]        Height:       63.0 in Accession #:    [id number]       Weight:       116.8 lb Date of Birth:  06-07-1951         BSA:          1.539 m Patient Age:    73 years         BP:           96/59 mmHg Patient Gender: F                HR:           88 bpm. Exam Location:  [facility] Procedure: 2D Echo, 3D Echo, Cardiac Doppler and Color Doppler (Both Spectral            and Color Flow Doppler were utilized during procedure). Indications:    Chemo Z09  History:        Patient has prior history of Echocardiogram examinations, most                 recent 10/22/2023. Stroke; Risk Factors:Dyslipidemia and Former                 Smoker. Breast cancer of upper-outer quadrant of left female                 breast (HCC).  Sonographer:    Denese Finn RCS Referring Phys: [phone] Paulett Boros IMPRESSIONS  1. Left ventricular ejection fraction, by estimation, is 60 to 65%. The left ventricle has normal function. The left  ventricle has no regional wall motion abnormalities. There is mild left ventricular hypertrophy. Left ventricular diastolic parameters are indeterminate.  2. Right ventricular systolic function is normal. The right ventricular size is normal. There is normal pulmonary artery systolic pressure.  3. The mitral valve is normal in structure. Mild mitral valve regurgitation. No evidence of mitral stenosis.  4. The tricuspid valve is abnormal.  5. The aortic valve is tricuspid. Aortic valve regurgitation is not visualized. No aortic stenosis is present.  6. The inferior vena cava is dilated in size with >50% respiratory variability, suggesting right atrial pressure of 8 mmHg. FINDINGS  Left Ventricle: Left ventricular ejection fraction, by estimation, is 60 to 65%. The left ventricle has normal function. The left ventricle has no regional wall motion abnormalities. The left ventricular internal cavity size was normal in size. There is  mild left ventricular hypertrophy. Left ventricular diastolic parameters are indeterminate. Right Ventricle: The right ventricular size is normal. Right vetricular wall thickness was not well visualized. Right ventricular systolic function is normal. There is normal pulmonary artery systolic pressure. The tricuspid regurgitant velocity is 2.35 m/s, and with an assumed right  atrial pressure of 8 mmHg, the estimated right ventricular systolic pressure is 30.1 mmHg. Left Atrium: Left atrial size was normal in size. Right Atrium: Right atrial size was normal in size. Pericardium: There is no evidence of pericardial effusion. Mitral Valve: The mitral valve is normal in structure. Mild mitral valve regurgitation. No evidence of mitral valve stenosis. Tricuspid Valve: The tricuspid valve is abnormal. Tricuspid valve regurgitation is mild . No evidence of tricuspid stenosis. Aortic Valve: The aortic valve is tricuspid. Aortic valve regurgitation is not visualized. No aortic stenosis is present.  Aortic valve mean gradient measures 3.0 mmHg. Aortic valve peak gradient measures 7.4 mmHg. Aortic valve area, by VTI measures 3.23 cm. Pulmonic Valve: The pulmonic valve was not well visualized. Pulmonic valve regurgitation is not visualized. No evidence of pulmonic stenosis. Aorta: The aortic root is normal in size and structure and the ascending aorta was not well visualized. Venous: The inferior vena cava is dilated in size with greater than 50% respiratory variability, suggesting right atrial pressure of 8 mmHg. IAS/Shunts: No atrial level shunt detected by color flow Doppler. Additional Comments: 3D was performed not requiring image post processing on an independent workstation and was normal.  LEFT VENTRICLE PLAX 2D LVIDd:         4.27 cm   Diastology LVIDs:         2.77 cm   LV e' medial:    5.87 cm/s LV PW:         1.15 cm   LV E/e' medial:  11.8 LV IVS:        1.25 cm   LV e' lateral:   6.64 cm/s LVOT diam:     2.10 cm   LV E/e' lateral: 10.5 LV SV:         63 LV SV Index:   41 LVOT Area:     3.46 cm                           3D Volume EF:                          3D EF:        62 %                          LV EDV:       102 ml                          LV ESV:       39 ml                          LV SV:        64 ml RIGHT VENTRICLE RV S prime:     13.65 cm/s TAPSE (M-mode): 1.6 cm LEFT ATRIUM             Index        RIGHT ATRIUM           Index LA diam:        3.40 cm 2.21 cm/m   RA Area:     12.80 cm LA Vol (A2C):   53.0 ml 34.44 ml/m  RA Volume:   26.50 ml  17.22 ml/m LA Vol (A4C):   43.3 ml 28.14 ml/m LA Biplane Vol: 48.5 ml  31.52 ml/m  AORTIC VALVE AV Area (Vmax):    2.84 cm AV Area (Vmean):   3.09 cm AV Area (VTI):     3.23 cm AV Vmax:           135.60 cm/s AV Vmean:          79.531 cm/s AV VTI:            0.196 m AV Peak Grad:      7.4 mmHg AV Mean Grad:      3.0 mmHg LVOT Vmax:         111.00 cm/s LVOT Vmean:        71.000 cm/s LVOT VTI:          0.183 m LVOT/AV VTI ratio: 0.93  AORTA Ao  Root diam: 3.40 cm MITRAL VALVE               TRICUSPID VALVE MV Area (PHT): 3.42 cm    TR Peak grad:   22.1 mmHg MV Decel Time: 222 msec    TR Vmax:        235.00 cm/s MV E velocity: 69.50 cm/s MV A velocity: 67.30 cm/s  SHUNTS MV E/A ratio:  1.03        Systemic VTI:  0.18 m                            Systemic Diam: 2.10 cm Armida Lander MD Electronically signed by Armida Lander MD Signature Date/Time: 01/20/2024/11:01:52 AM    Final

## 2024-02-18 NOTE — Patient Instructions (Signed)

## 2024-02-18 NOTE — Progress Notes (Signed)
 Patient expressed to RN that she needs financial help to continue treatments. RN notified MDs treatment team.  Referral to social worker placed by Margretta Shi, Charity fundraiser.    Patient tolerated chemotherapy with no complaints voiced.  Side effects with management reviewed with understanding verbalized.  Port site clean and dry with no bruising or swelling noted at site.  Good blood return noted before and after administration of chemotherapy.  Band aid applied.  Patient left in satisfactory condition with VSS and no s/s of distress noted. All follow ups as scheduled.   [employer]

## 2024-02-19 ENCOUNTER — Encounter: Payer: Medicare Other | Admitting: Family Medicine

## 2024-02-19 ENCOUNTER — Inpatient Hospital Stay: Admitting: Licensed Clinical Social Worker

## 2024-02-19 DIAGNOSIS — Z170 Estrogen receptor positive status [ER+]: Secondary | ICD-10-CM

## 2024-02-19 NOTE — Progress Notes (Signed)
 [facility] CSW Progress Note  Clinical Child psychotherapist contacted patient by phone regarding financial concerns.   Pt informed at last treatment she will need to continue infusion therapy until next January.  Pt reports she went to DSS who informed her she does not qualify for food stamps which makes her ineligible for the [employer].  CSW sent an email to the [facility], with whom the pt is connected, requesting financial assistance.  CSW to continue to provide support as appropriate throughout duration of treatment.      Pamela Bruns, LCSW Clinical Social Worker [facility]

## 2024-02-20 ENCOUNTER — Inpatient Hospital Stay

## 2024-02-20 VITALS — BP 106/67 | HR 87 | Temp 98.1°F | Resp 18

## 2024-02-20 DIAGNOSIS — R110 Nausea: Secondary | ICD-10-CM | POA: Diagnosis not present

## 2024-02-20 DIAGNOSIS — Z5111 Encounter for antineoplastic chemotherapy: Secondary | ICD-10-CM | POA: Diagnosis not present

## 2024-02-20 DIAGNOSIS — Z79899 Other long term (current) drug therapy: Secondary | ICD-10-CM | POA: Diagnosis not present

## 2024-02-20 DIAGNOSIS — Z5189 Encounter for other specified aftercare: Secondary | ICD-10-CM | POA: Diagnosis not present

## 2024-02-20 DIAGNOSIS — C50412 Malignant neoplasm of upper-outer quadrant of left female breast: Secondary | ICD-10-CM | POA: Diagnosis not present

## 2024-02-20 DIAGNOSIS — M810 Age-related osteoporosis without current pathological fracture: Secondary | ICD-10-CM | POA: Diagnosis not present

## 2024-02-20 DIAGNOSIS — Z5112 Encounter for antineoplastic immunotherapy: Secondary | ICD-10-CM | POA: Diagnosis not present

## 2024-02-20 DIAGNOSIS — Z8673 Personal history of transient ischemic attack (TIA), and cerebral infarction without residual deficits: Secondary | ICD-10-CM | POA: Diagnosis not present

## 2024-02-20 DIAGNOSIS — Z1731 Human epidermal growth factor receptor 2 positive status: Secondary | ICD-10-CM | POA: Diagnosis not present

## 2024-02-20 DIAGNOSIS — Z86718 Personal history of other venous thrombosis and embolism: Secondary | ICD-10-CM | POA: Diagnosis not present

## 2024-02-20 DIAGNOSIS — Z87891 Personal history of nicotine dependence: Secondary | ICD-10-CM | POA: Diagnosis not present

## 2024-02-20 MED ORDER — PEGFILGRASTIM-CBQV 6 MG/0.6ML ~~LOC~~ SOSY
6.0000 mg | PREFILLED_SYRINGE | Freq: Once | SUBCUTANEOUS | Status: AC
  Administered 2024-02-20: 6 mg via SUBCUTANEOUS
  Filled 2024-02-20: qty 0.6

## 2024-02-20 NOTE — Patient Instructions (Signed)

## 2024-02-20 NOTE — Progress Notes (Signed)
 Patient tolerated therapy with no complaints voiced.  Side effects with management reviewed with understanding verbalized.  Port site clean and dry with no bruising or swelling noted at site.  Good blood return noted before and after administration of therapy.  Band aid applied.  Patient left in satisfactory condition with VSS and no s/s of distress noted.

## 2024-02-25 DIAGNOSIS — F410 Panic disorder [episodic paroxysmal anxiety] without agoraphobia: Secondary | ICD-10-CM | POA: Diagnosis not present

## 2024-02-25 DIAGNOSIS — F411 Generalized anxiety disorder: Secondary | ICD-10-CM | POA: Diagnosis not present

## 2024-02-25 DIAGNOSIS — F331 Major depressive disorder, recurrent, moderate: Secondary | ICD-10-CM | POA: Diagnosis not present

## 2024-03-04 DIAGNOSIS — C50412 Malignant neoplasm of upper-outer quadrant of left female breast: Secondary | ICD-10-CM | POA: Diagnosis not present

## 2024-03-09 ENCOUNTER — Encounter: Payer: Self-pay | Admitting: "Endocrinology

## 2024-03-09 ENCOUNTER — Ambulatory Visit (INDEPENDENT_AMBULATORY_CARE_PROVIDER_SITE_OTHER): Payer: Medicare Other | Admitting: "Endocrinology

## 2024-03-09 VITALS — BP 80/42 | HR 96 | Ht 63.0 in | Wt 111.0 lb

## 2024-03-09 DIAGNOSIS — E559 Vitamin D deficiency, unspecified: Secondary | ICD-10-CM | POA: Diagnosis not present

## 2024-03-09 DIAGNOSIS — M810 Age-related osteoporosis without current pathological fracture: Secondary | ICD-10-CM | POA: Diagnosis not present

## 2024-03-09 NOTE — Progress Notes (Signed)
 03/09/2024, 2:58 PM   Endocrinology follow-up note  Subjective:    Patient ID: Pamela Henson, female    DOB: [birth year], PCP Myrna Ast, DO   Past Medical History:  Diagnosis Date   Anemia    Bipolar 1 disorder (HCC)    Bipolar 1 disorder (HCC)    Complication of anesthesia    Headache    History of chicken pox    History of measles    History of mumps    PONV (postoperative nausea and vomiting)    Stroke (HCC) 1975   Urine retention    takes Flomax    Past Surgical History:  Procedure Laterality Date   ABDOMINAL HYSTERECTOMY  09/24/1977   age 73 due to vaginal bleeding   BREAST BIOPSY Left 08/30/2023   US  LT BREAST BX W LOC DEV 1ST LESION IMG BX SPEC US  GUIDE 08/30/2023 [facility]   BREAST LUMPECTOMY Left 09/26/2023   COLONOSCOPY N/A 10/30/2017   Procedure: COLONOSCOPY;  Surgeon: Ruby Corporal, MD;  [facility]: [facility];  Service: Endoscopy;  Laterality: N/A;  1225   COLONOSCOPY WITH PROPOFOL  N/A 10/13/2022   Procedure: COLONOSCOPY WITH PROPOFOL ;  Surgeon: Urban Garden, MD;  Location: [facility];  Service: Gastroenterology;  Laterality: N/A;   ESOPHAGOGASTRODUODENOSCOPY (EGD) WITH PROPOFOL  N/A 10/13/2022   Procedure: ESOPHAGOGASTRODUODENOSCOPY (EGD) WITH PROPOFOL ;  Surgeon: Urban Garden, MD;  [facility];  Service: Gastroenterology;  Laterality: N/A;   ORIF TIBIA PLATEAU Right 12/12/2022   Procedure: OPEN REDUCTION INTERNAL FIXATION (ORIF) TIBIAL PLATEAU;  Surgeon: Laneta Pintos, MD;  Location: [facility];  Service: Orthopedics;  Laterality: Right;   PARTIAL MASTECTOMY WITH AXILLARY SENTINEL LYMPH NODE BIOPSY Left 09/26/2023   Procedure: PARTIAL MASTECTOMY WITH AXILLARY SENTINEL LYMPH NODE BIOPSY;  Surgeon: Awilda Bogus, MD;  Location: [facility];  Service: General;  Laterality: Left;   PERIPHERAL VASCULAR THROMBECTOMY Right 11/2022   2 DVT following surgical  intervention of fracture   POLYPECTOMY  10/13/2022   Procedure: POLYPECTOMY;  Surgeon: Urban Garden, MD;  [facility];  Service: Gastroenterology;;   PORTACATH PLACEMENT Right 11/01/2023   Procedure: INSERTION PORT-A-CATH;  Surgeon: Awilda Bogus, MD;  Location: [facility];  Service: General;  Laterality: Right;   Social History   Socioeconomic History   Marital status: Widowed    Spouse name: Not on file   Number of children: 1   Years of education: Not on file   Highest education level: Some college, no degree  Occupational History   Occupation: Disablity    Comment: Due to back problems  Tobacco Use   Smoking status: Former    Current packs/day: 0.00    Average packs/day: 1 pack/day for 15.0 years (15.0 ttl pk-yrs)    Types: Cigarettes    Start date: 09/25/1983    Quit date: 09/24/1998    Years since quitting: 25.4   Smokeless tobacco: Never  Vaping Use   Vaping status: Never Used  Substance and Sexual Activity   Alcohol use: No    Alcohol/week: 0.0 standard drinks of alcohol   Drug use: No   Sexual activity: Not on file  Other Topics Concern   Not on  file  Social History Narrative   Not on file   Social Drivers of Health   Financial Resource Strain: Low Risk  (08/16/2023)   Overall Financial Resource Strain (CARDIA)    Difficulty of Paying Living Expenses: Not hard at all  Food Insecurity: No Food Insecurity (03/04/2024)   [facility]   Hunger Vital Sign    Within the past 12 months, you worried that your food would run out before you got the money to buy more.: Never true    Within the past 12 months, the food you bought just didn't last and you didn't have money to get more.: Never true  Transportation Needs: No Transportation Needs (03/04/2024)   Received from [facility] - Transportation    In the past 12 months, has lack of transportation kept you from medical appointments or from getting medications?: No     In the past 12 months, has lack of transportation kept you from meetings, work, or from getting things needed for daily living?: No  Physical Activity: Inactive (08/16/2023)   Exercise Vital Sign    Days of Exercise per Week: 0 days    Minutes of Exercise per Session: 30 min  Stress: Stress Concern Present (08/16/2023)   [employer] of Occupational Health - Occupational Stress Questionnaire    Feeling of Stress : To some extent  Social Connections: Moderately Integrated (08/16/2023)   Social Connection and Isolation Panel    Frequency of Communication with Friends and Family: More than three times a week    Frequency of Social Gatherings with Friends and Family: More than three times a week    Attends Religious Services: 1 to 4 times per year    Active Member of [employer] or Organizations: No    Attends Engineer, structural: More than 4 times per year    Marital Status: Widowed   Family History  Problem Relation Age of Onset   Stroke Mother 64   Stroke Father 39   Colon cancer Neg Hx    Outpatient Encounter Medications as of 03/09/2024  Medication Sig   acetaminophen  (TYLENOL ) 500 MG tablet Take 500 mg by mouth every 6 (six) hours as needed for mild pain, moderate pain or headache.   calcium -vitamin D  (OSCAL WITH D) 500-5 MG-MCG tablet Take 1 tablet by mouth 2 (two) times daily with a meal.   CARBOPLATIN  IV Inject into the vein.   Cholecalciferol  (VITAMIN D ) 50 MCG (2000 UT) tablet Take 2,000 Units by mouth daily.   DOCEtaxel  (TAXOTERE  IV) Inject into the vein.   gabapentin  (NEURONTIN ) 100 MG capsule Take 100 mg by mouth 3 (three) times daily.   lidocaine -prilocaine  (EMLA ) cream Apply to affected area once   melatonin 5 MG TABS Take 5 mg by mouth at bedtime as needed (sleep).   Multiple Vitamins-Minerals (MULTIVITAMIN WITH MINERALS) tablet Take 1 tablet by mouth daily.   prochlorperazine  (COMPAZINE ) 10 MG tablet Take 1 tablet (10 mg total) by mouth every 6 (six) hours  as needed for nausea or vomiting.   rosuvastatin  (CRESTOR ) 5 MG tablet Take 1 tablet (5 mg total) by mouth daily.   sertraline  (ZOLOFT ) 50 MG tablet Take 50 mg by mouth at bedtime.   tamsulosin  (FLOMAX ) 0.4 MG CAPS capsule Take 1 capsule (0.4 mg total) by mouth at bedtime.   traMADol  (ULTRAM ) 50 MG tablet Take 1 tablet (50 mg total) by mouth every 6 (six) hours as needed for severe pain (pain  score 7-10).   Trastuzumab -anns (KANJINTI  IV) Inject into the vein.   No facility-administered encounter medications on file as of 03/09/2024.   ALLERGIES: Allergies  Allergen Reactions   Asa [Aspirin] Other (See Comments)    Had GI bleed 09/2022   Eliquis [Apixaban]     Severe headaches    Oxycodone      Agitation, caused bipolar episode     VACCINATION STATUS: Immunization History  Administered Date(s) Administered   Fluad Trivalent(High Dose 65+) 08/17/2023   Moderna Covid-19 Fall Seasonal Vaccine 49yrs & older 08/17/2023   PNEUMOCOCCAL CONJUGATE-20 08/20/2023    HPI Sosha Shepherd is 73 y.o. female who presents today with a medical history as above. she is being seen in follow-up after she was seen in consultation for osteoporosis requested by Cook, Jayce G, DO.     Patient did have right lower extremity fracture in March 2024 due to a fall from stool she was standing on to fix something overhead.  It was reported as closed bicondylar fracture. Subsequently, she was offered screening bone density which she had on July 31, 2023 showing negative T-score of -3.1 on AP spine, -4.3 on right femur, and -4.0 on dual femur total mean. She was not previously diagnosed with osteoporosis and this was her first bone density.  She is a former smoker.  She denies any exposure to high-dose steroids.  She denies height loss. She was started on yearly Reclast  infusion during the prior visit.  She has received her first treatment in January 2024.  She has tolerated this treatment very well.  She is  scheduled for her next treatment in January 2025. Her previsit labs indicate normocalcemia, normal phosphorus and magnesium.  She does have mild hypercalciuria.  She has no new complaints today. Recently, she was diagnosed with breast cancer, currently being for options of treatment.  She denies any thyroid /parathyroid dysfunctions. She is on regular vitamin D  supplement. Her other medical problems include anemia, mood disorders, arthritis, and degenerative lumbosacral disc. -She does not follow any particular diet or exercise regimen.  She has no major weight change, reports to have been normal with slight weight with current BMI of 19.66. Her daily and dietary intake of calcium  seems to be appropriate.   Review of Systems  Constitutional: +mildly fluctuating body weight , no fatigue, no subjective hyperthermia, no subjective hypothermia Eyes: no blurry vision, no xerophthalmia ENT: no sore throat, no nodules palpated in throat, no dysphagia/odynophagia, no hoarseness   Objective:       03/09/2024    1:16 PM 02/20/2024    9:20 AM 02/20/2024    9:10 AM  Vitals with BMI  Height 5' 3    Weight 111 lbs    BMI 19.67    Systolic 80 106 83  Diastolic 42 67 54  Pulse 96      BP (!) 80/42   Pulse 96   Ht 5' 3 (1.6 m)   Wt 111 lb (50.3 kg)   BMI 19.66 kg/m   Wt Readings from Last 3 Encounters:  03/09/24 111 lb (50.3 kg)  02/18/24 113 lb 8.6 oz (51.5 kg)  01/27/24 114 lb 6.7 oz (51.9 kg)    Physical Exam  Constitutional:  Body mass index is 19.66 kg/m.,  not in acute distress, normal state of mind Eyes: PERRLA, EOMI, no exophthalmos ENT: moist mucous membranes, no gross thyromegaly, no gross cervical lymphadenopathy Cardiovascular: normal precordial activity, Regular Rate and Rhythm, no Murmur/Rubs/Gallops   CMP ( most  recent) CMP     Component Value Date/Time   NA 140 02/18/2024 0921   NA 147 (H) 08/20/2023 1122   K 4.3 02/18/2024 0921   CL 106 02/18/2024 0921    CO2 25 02/18/2024 0921   GLUCOSE 99 02/18/2024 0921   BUN 12 02/18/2024 0921   BUN 12 08/20/2023 1122   CREATININE 0.49 02/18/2024 0921   CALCIUM  9.0 02/18/2024 0921   PROT 6.4 (L) 02/18/2024 0921   PROT 6.5 08/20/2023 1122   ALBUMIN 3.5 02/18/2024 0921   ALBUMIN 4.1 08/20/2023 1122   AST 20 02/18/2024 0921   ALT 9 02/18/2024 0921   ALKPHOS 80 02/18/2024 0921   BILITOT 0.4 02/18/2024 0921   BILITOT <0.2 08/20/2023 1122   EGFR 88 08/20/2023 1122   GFRNONAA >60 02/18/2024 0921    Lipid Panel     Component Value Date/Time   CHOL 189 08/20/2023 1122   TRIG 124 08/20/2023 1122   HDL 56 08/20/2023 1122   CHOLHDL 3.4 08/20/2023 1122   LDLCALC 111 (H) 08/20/2023 1122   LABVLDL 22 08/20/2023 1122     AP Spine L1-L3 (L2) 07/31/2023 70.5 Osteoporosis -3.1 0.789 g/cm2 - -DualFemur Total Right 07/31/2023 70.5 Osteoporosis -4.3 0.461 g/cm2   DualFemur Total Mean 07/31/2023 70.5 Osteoporosis -4.0 0.499 g/cm2 - - ASSESSMENT: The BMD measured at Femur Total Right is 0.461 g/cm2 with a T-score of -4.3. This patient is considered osteoporotic according to World Health Organization [facility]) criteria. The scan quality is good. L2 and L4 were excluded due to advanced degenerative changes.  Assessment & Plan:   Severe osteoporosis -spine/hip   2.  Hypocalcemia    3.  Vitamin D  deficiency  - I have reviewed her available  and new  records and clinically evaluated the patient. - Based on these reviews, she has severe osteoporosis of the spine, femur, and hip. She presents with correction of her previously documented mild hypocalcemia. Workup for secondary causes of osteoporosis are so far been unrevealing. -She will be continued on IV Reclast  yearly.  She has received her first treatment in January 2024, will be scheduled for next treatment in January 2025.  She will need repeat CMP Scheduled treatment.  She is encouraged to continue calcium  and vitamin D  combination supplement.  She will need  repeat bone density in November November 2026.   -she has high risk for fracture which was discussed with her.   - she is advised to maintain close follow up with Cook, Jayce G, DO for primary care needs.  I spent  22  minutes in the care of the patient today including review of labs from Thyroid  Function, CMP, and other relevant labs ; imaging/biopsy records (current and previous including abstractions from other facilities); face-to-face time discussing  her lab results and symptoms, medications doses, her options of short and long term treatment based on the latest standards of care / guidelines;   and documenting the encounter.  Angelica Bard Kornegay  participated in the discussions, expressed understanding, and voiced agreement with the above plans.  All questions were answered to her satisfaction. she is encouraged to contact clinic should she have any questions or concerns prior to her return visit.    Follow up plan: Return in about 6 months (around 09/08/2024), or Reclast  in January 2025, for F/U with Pre-visit Labs, Refer her for Reclast  Infusion.   Kalvin Orf, MD [facility] [address] [place], [place] [address] Phone: [phone]  Fax:  [phone]     03/09/2024, 2:58 PM  This note was partially dictated with voice recognition software. Similar sounding words can be transcribed inadequately or may not  be corrected upon review.

## 2024-03-10 ENCOUNTER — Inpatient Hospital Stay

## 2024-03-10 ENCOUNTER — Inpatient Hospital Stay: Attending: Hematology

## 2024-03-10 ENCOUNTER — Inpatient Hospital Stay: Admitting: Hematology

## 2024-03-10 VITALS — BP 90/58 | HR 98 | Temp 97.7°F | Resp 20 | Wt 110.5 lb

## 2024-03-10 VITALS — BP 124/73 | HR 72 | Resp 19

## 2024-03-10 DIAGNOSIS — Z170 Estrogen receptor positive status [ER+]: Secondary | ICD-10-CM

## 2024-03-10 DIAGNOSIS — Z86718 Personal history of other venous thrombosis and embolism: Secondary | ICD-10-CM | POA: Insufficient documentation

## 2024-03-10 DIAGNOSIS — Z95828 Presence of other vascular implants and grafts: Secondary | ICD-10-CM

## 2024-03-10 DIAGNOSIS — Z1731 Human epidermal growth factor receptor 2 positive status: Secondary | ICD-10-CM | POA: Diagnosis not present

## 2024-03-10 DIAGNOSIS — Z8673 Personal history of transient ischemic attack (TIA), and cerebral infarction without residual deficits: Secondary | ICD-10-CM | POA: Insufficient documentation

## 2024-03-10 DIAGNOSIS — Z5112 Encounter for antineoplastic immunotherapy: Secondary | ICD-10-CM | POA: Insufficient documentation

## 2024-03-10 DIAGNOSIS — Z87891 Personal history of nicotine dependence: Secondary | ICD-10-CM | POA: Diagnosis not present

## 2024-03-10 DIAGNOSIS — M810 Age-related osteoporosis without current pathological fracture: Secondary | ICD-10-CM | POA: Insufficient documentation

## 2024-03-10 DIAGNOSIS — Z79899 Other long term (current) drug therapy: Secondary | ICD-10-CM | POA: Diagnosis not present

## 2024-03-10 DIAGNOSIS — C50412 Malignant neoplasm of upper-outer quadrant of left female breast: Secondary | ICD-10-CM | POA: Insufficient documentation

## 2024-03-10 LAB — COMPREHENSIVE METABOLIC PANEL WITH GFR
ALT: 8 U/L (ref 0–44)
AST: 21 U/L (ref 15–41)
Albumin: 3.6 g/dL (ref 3.5–5.0)
Alkaline Phosphatase: 91 U/L (ref 38–126)
Anion gap: 10 (ref 5–15)
BUN: 12 mg/dL (ref 8–23)
CO2: 26 mmol/L (ref 22–32)
Calcium: 9.2 mg/dL (ref 8.9–10.3)
Chloride: 104 mmol/L (ref 98–111)
Creatinine, Ser: 0.57 mg/dL (ref 0.44–1.00)
GFR, Estimated: >60 mL/min (ref >60)
Glucose, Bld: 120 mg/dL — ABNORMAL HIGH (ref 70–99)
Potassium: 4 mmol/L (ref 3.5–5.1)
Sodium: 140 mmol/L (ref 135–145)
Total Bilirubin: 0.5 mg/dL (ref 0.0–1.2)
Total Protein: 6.5 g/dL (ref 6.5–8.1)

## 2024-03-10 LAB — CBC WITH DIFFERENTIAL/PLATELET
Abs Immature Granulocytes: 0.01 10*3/uL (ref 0.00–0.07)
Basophils Absolute: 0.1 10*3/uL (ref 0.0–0.1)
Basophils Relative: 1 %
Eosinophils Absolute: 0 10*3/uL (ref 0.0–0.5)
Eosinophils Relative: 0 %
HCT: 43.5 % (ref 36.0–46.0)
Hemoglobin: 13.9 g/dL (ref 12.0–15.0)
Immature Granulocytes: 0 %
Lymphocytes Relative: 25 %
Lymphs Abs: 1.7 10*3/uL (ref 0.7–4.0)
MCH: 31.8 pg (ref 26.0–34.0)
MCHC: 32 g/dL (ref 30.0–36.0)
MCV: 99.5 fL (ref 80.0–100.0)
Monocytes Absolute: 0.5 10*3/uL (ref 0.1–1.0)
Monocytes Relative: 8 %
Neutro Abs: 4.4 10*3/uL (ref 1.7–7.7)
Neutrophils Relative %: 66 %
Platelets: 264 10*3/uL (ref 150–400)
RBC: 4.37 MIL/uL (ref 3.87–5.11)
RDW: 15.8 % — ABNORMAL HIGH (ref 11.5–15.5)
WBC: 6.7 10*3/uL (ref 4.0–10.5)
nRBC: 0 % (ref 0.0–0.2)

## 2024-03-10 LAB — MAGNESIUM: Magnesium: 2.2 mg/dL (ref 1.7–2.4)

## 2024-03-10 MED ORDER — ACETAMINOPHEN 325 MG PO TABS
650.0000 mg | ORAL_TABLET | Freq: Once | ORAL | Status: AC
  Administered 2024-03-10: 650 mg via ORAL
  Filled 2024-03-10: qty 2

## 2024-03-10 MED ORDER — SODIUM CHLORIDE 0.9% FLUSH
10.0000 mL | INTRAVENOUS | Status: DC | PRN
  Administered 2024-03-10: 10 mL via INTRAVENOUS

## 2024-03-10 MED ORDER — TRASTUZUMAB-ANNS CHEMO 150 MG IV SOLR
300.0000 mg | Freq: Once | INTRAVENOUS | Status: AC
  Administered 2024-03-10: 300 mg via INTRAVENOUS
  Filled 2024-03-10: qty 14.29

## 2024-03-10 MED ORDER — SODIUM CHLORIDE 0.9 % IV SOLN: INTRAVENOUS | Status: DC

## 2024-03-10 MED ORDER — SODIUM CHLORIDE 0.9% FLUSH: 10.0000 mL | INTRAVENOUS | Status: DC | PRN

## 2024-03-10 MED ORDER — DIPHENHYDRAMINE HCL 25 MG PO CAPS
25.0000 mg | ORAL_CAPSULE | Freq: Once | ORAL | Status: AC
  Administered 2024-03-10: 25 mg via ORAL
  Filled 2024-03-10: qty 1

## 2024-03-10 MED ORDER — HEPARIN SOD (PORK) LOCK FLUSH 100 UNIT/ML IV SOLN
500.0000 [IU] | Freq: Once | INTRAVENOUS | Status: AC | PRN
  Administered 2024-03-10: 500 [IU]

## 2024-03-10 NOTE — Progress Notes (Signed)
 Patient tolerated chemotherapy with no complaints voiced.  Side effects with management reviewed with understanding verbalized.  Port site clean and dry with no bruising or swelling noted at site.  Good blood return noted before and after administration of chemotherapy.  Band aid applied.  Patient left in satisfactory condition with VSS and no s/s of distress noted. All follow ups as scheduled.   Venkat Ankney [employer]

## 2024-03-10 NOTE — Patient Instructions (Signed)
 [facility] [place] - A DEPT OF [facility]  Discharge Instructions: Thank you for choosing [facility] to provide your oncology and hematology care.  If you have a lab appointment with the Cancer Center - please note that after April 8th, 2024, all labs will be drawn in the cancer center.  You do not have to check in or register with the main entrance as you have in the past but will complete your check-in in the cancer center.  Wear comfortable clothing and clothing appropriate for easy access to any Portacath or PICC line.   We strive to give you quality time with your provider. You may need to reschedule your appointment if you arrive late (15 or more minutes).  Arriving late affects you and other patients whose appointments are after yours.  Also, if you miss three or more appointments without notifying the office, you may be dismissed from the clinic at the provider's discretion.      For prescription refill requests, have your pharmacy contact our office and allow 72 hours for refills to be completed.    Today you received the following chemotherapy and/or immunotherapy agents kanjinti      To help prevent nausea and vomiting after your treatment, we encourage you to take your nausea medication as directed.  BELOW ARE SYMPTOMS THAT SHOULD BE REPORTED IMMEDIATELY: *FEVER GREATER THAN 100.4 F (38 C) OR HIGHER *CHILLS OR SWEATING *NAUSEA AND VOMITING THAT IS NOT CONTROLLED WITH YOUR NAUSEA MEDICATION *UNUSUAL SHORTNESS OF BREATH *UNUSUAL BRUISING OR BLEEDING *URINARY PROBLEMS (pain or burning when urinating, or frequent urination) *BOWEL PROBLEMS (unusual diarrhea, constipation, pain near the anus) TENDERNESS IN MOUTH AND THROAT WITH OR WITHOUT PRESENCE OF ULCERS (sore throat, sores in mouth, or a toothache) UNUSUAL RASH, SWELLING OR PAIN  UNUSUAL VAGINAL DISCHARGE OR ITCHING   Items with * indicate a potential emergency and should be followed up  as soon as possible or go to the Emergency Department if any problems should occur.  Please show the CHEMOTHERAPY ALERT CARD or IMMUNOTHERAPY ALERT CARD at check-in to the Emergency Department and triage nurse.  Should you have questions after your visit or need to cancel or reschedule your appointment, please contact [facility]  - A DEPT OF [facility] [phone]  and follow the prompts.  Office hours are 8:00 a.m. to 4:30 p.m. Monday - Friday. Please note that voicemails left after 4:00 p.m. may not be returned until the following business day.  We are closed weekends and major holidays. You have access to a nurse at all times for urgent questions. Please call the main number to the clinic [phone] and follow the prompts.  For any non-urgent questions, you may also contact your provider using MyChart. We now offer e-Visits for anyone 28 and older to request care online for non-urgent symptoms. For details visit [email or web address].   Also download the MyChart app! Go to the app store, search "MyChart", open the app, select [facility], and log in with your MyChart username and password.

## 2024-03-10 NOTE — Progress Notes (Signed)
 Decrease diphenhydramine  dose to 25 mg orally x 1 as premedication for Kanjinti .  Adjust Kanjinti  dose to new weight 50.1 kg  Kanjinti  6 mg/kg = 300 mg  V.O. Dr Davina Ester, PharmD

## 2024-03-10 NOTE — Progress Notes (Signed)
 Patients port flushed without difficulty.  Good blood return noted with no bruising or swelling noted at site.  Patient remains accessed for treatment.

## 2024-03-12 ENCOUNTER — Inpatient Hospital Stay

## 2024-03-25 DIAGNOSIS — C50412 Malignant neoplasm of upper-outer quadrant of left female breast: Secondary | ICD-10-CM | POA: Diagnosis not present

## 2024-03-31 ENCOUNTER — Inpatient Hospital Stay: Admitting: Hematology

## 2024-03-31 ENCOUNTER — Inpatient Hospital Stay: Attending: Hematology

## 2024-03-31 ENCOUNTER — Ambulatory Visit (HOSPITAL_COMMUNITY)
Admission: RE | Admit: 2024-03-31 | Discharge: 2024-03-31 | Disposition: A | Discharge status: Home / Self Care | Source: Ambulatory Visit | Attending: Hematology | Admitting: Hematology

## 2024-03-31 ENCOUNTER — Inpatient Hospital Stay

## 2024-03-31 VITALS — BP 117/71 | HR 90 | Temp 97.8°F | Resp 20 | Wt 111.2 lb

## 2024-03-31 DIAGNOSIS — C50412 Malignant neoplasm of upper-outer quadrant of left female breast: Secondary | ICD-10-CM | POA: Insufficient documentation

## 2024-03-31 DIAGNOSIS — Z87891 Personal history of nicotine dependence: Secondary | ICD-10-CM | POA: Diagnosis not present

## 2024-03-31 DIAGNOSIS — R0600 Dyspnea, unspecified: Secondary | ICD-10-CM | POA: Insufficient documentation

## 2024-03-31 DIAGNOSIS — R0609 Other forms of dyspnea: Secondary | ICD-10-CM | POA: Diagnosis not present

## 2024-03-31 DIAGNOSIS — M810 Age-related osteoporosis without current pathological fracture: Secondary | ICD-10-CM | POA: Diagnosis not present

## 2024-03-31 DIAGNOSIS — Z5112 Encounter for antineoplastic immunotherapy: Secondary | ICD-10-CM | POA: Insufficient documentation

## 2024-03-31 DIAGNOSIS — Z1731 Human epidermal growth factor receptor 2 positive status: Secondary | ICD-10-CM | POA: Insufficient documentation

## 2024-03-31 DIAGNOSIS — Z79899 Other long term (current) drug therapy: Secondary | ICD-10-CM | POA: Diagnosis not present

## 2024-03-31 DIAGNOSIS — Z452 Encounter for adjustment and management of vascular access device: Secondary | ICD-10-CM | POA: Diagnosis not present

## 2024-03-31 DIAGNOSIS — Z853 Personal history of malignant neoplasm of breast: Secondary | ICD-10-CM | POA: Diagnosis not present

## 2024-03-31 DIAGNOSIS — Z79811 Long term (current) use of aromatase inhibitors: Secondary | ICD-10-CM | POA: Diagnosis not present

## 2024-03-31 DIAGNOSIS — Z8673 Personal history of transient ischemic attack (TIA), and cerebral infarction without residual deficits: Secondary | ICD-10-CM | POA: Insufficient documentation

## 2024-03-31 DIAGNOSIS — Z86718 Personal history of other venous thrombosis and embolism: Secondary | ICD-10-CM | POA: Diagnosis not present

## 2024-03-31 DIAGNOSIS — Z4682 Encounter for fitting and adjustment of non-vascular catheter: Secondary | ICD-10-CM | POA: Diagnosis not present

## 2024-03-31 DIAGNOSIS — Z170 Estrogen receptor positive status [ER+]: Secondary | ICD-10-CM

## 2024-03-31 DIAGNOSIS — R0602 Shortness of breath: Secondary | ICD-10-CM | POA: Diagnosis not present

## 2024-03-31 LAB — CBC WITH DIFFERENTIAL/PLATELET
Abs Immature Granulocytes: 0.01 K/uL (ref 0.00–0.07)
Basophils Absolute: 0.1 K/uL (ref 0.0–0.1)
Basophils Relative: 1 %
Eosinophils Absolute: 0.1 K/uL (ref 0.0–0.5)
Eosinophils Relative: 2 %
HCT: 41.6 % (ref 36.0–46.0)
Hemoglobin: 13.6 g/dL (ref 12.0–15.0)
Immature Granulocytes: 0 %
Lymphocytes Relative: 28 %
Lymphs Abs: 1.6 K/uL (ref 0.7–4.0)
MCH: 32.6 pg (ref 26.0–34.0)
MCHC: 32.7 g/dL (ref 30.0–36.0)
MCV: 99.8 fL (ref 80.0–100.0)
Monocytes Absolute: 0.2 K/uL (ref 0.1–1.0)
Monocytes Relative: 4 %
Neutro Abs: 3.5 K/uL (ref 1.7–7.7)
Neutrophils Relative %: 65 %
Platelets: 164 K/uL (ref 150–400)
RBC: 4.17 MIL/uL (ref 3.87–5.11)
RDW: 14.5 % (ref 11.5–15.5)
WBC: 5.5 K/uL (ref 4.0–10.5)
nRBC: 0 % (ref 0.0–0.2)

## 2024-03-31 LAB — COMPREHENSIVE METABOLIC PANEL WITH GFR
ALT: 9 U/L (ref 0–44)
AST: 21 U/L (ref 15–41)
Albumin: 3.5 g/dL (ref 3.5–5.0)
Alkaline Phosphatase: 73 U/L (ref 38–126)
Anion gap: 6 (ref 5–15)
BUN: 12 mg/dL (ref 8–23)
CO2: 25 mmol/L (ref 22–32)
Calcium: 8.6 mg/dL — ABNORMAL LOW (ref 8.9–10.3)
Chloride: 109 mmol/L (ref 98–111)
Creatinine, Ser: 0.5 mg/dL (ref 0.44–1.00)
GFR, Estimated: >60 mL/min (ref >60)
Glucose, Bld: 159 mg/dL — ABNORMAL HIGH (ref 70–99)
Potassium: 3.8 mmol/L (ref 3.5–5.1)
Sodium: 140 mmol/L (ref 135–145)
Total Bilirubin: 0.5 mg/dL (ref 0.0–1.2)
Total Protein: 6.3 g/dL — ABNORMAL LOW (ref 6.5–8.1)

## 2024-03-31 LAB — MAGNESIUM: Magnesium: 2.1 mg/dL (ref 1.7–2.4)

## 2024-03-31 MED ORDER — HEPARIN SOD (PORK) LOCK FLUSH 100 UNIT/ML IV SOLN
500.0000 [IU] | Freq: Once | INTRAVENOUS | Status: AC
  Administered 2024-03-31: 500 [IU] via INTRAVENOUS

## 2024-03-31 MED ORDER — SODIUM CHLORIDE 0.9% FLUSH
10.0000 mL | INTRAVENOUS | Status: DC | PRN
  Administered 2024-03-31: 10 mL via INTRAVENOUS

## 2024-03-31 MED ORDER — ANASTROZOLE 1 MG PO TABS: 1.0000 mg | ORAL_TABLET | Freq: Every day | ORAL | 5 refills | Status: DC

## 2024-03-31 NOTE — Patient Instructions (Signed)
 [facility] at [facility] Discharge Instructions   You were seen and examined today by Dr. Rogers.  He reviewed the results of your lab work which are normal/stable.   We will hold your treatment today due to your shortness of breath. We will get a chest x-ray and an echocardiogram before restarting Herceptin .    Return as scheduled.    Thank you for choosing [facility] at [facility] to provide your oncology and hematology care.  To afford each patient quality time with our provider, please arrive at least 15 minutes before your scheduled appointment time.   If you have a lab appointment with the Cancer Center please come in thru the Main Entrance and check in at the main information desk.  You need to re-schedule your appointment should you arrive 10 or more minutes late.  We strive to give you quality time with our providers, and arriving late affects you and other patients whose appointments are after yours.  Also, if you no show three or more times for appointments you may be dismissed from the clinic at the providers discretion.     Again, thank you for choosing [facility].  Our hope is that these requests will decrease the amount of time that you wait before being seen by our physicians.       _____________________________________________________________  Should you have questions after your visit to [facility], please contact our office at [phone] and follow the prompts.  Our office hours are 8:00 a.m. and 4:30 p.m. Monday - Friday.  Please note that voicemails left after 4:00 p.m. may not be returned until the following business day.  We are closed weekends and major holidays.  You do have access to a nurse 24-7, just call the main number to the clinic [phone] and do not press any options, hold on the line and a nurse will answer the phone.    For prescription refill requests, have your pharmacy  contact our office and allow 72 hours.    Due to Covid, you will need to wear a mask upon entering the hospital. If you do not have a mask, a mask will be given to you at the Main Entrance upon arrival. For doctor visits, patients may have 1 support person age 71 or older with them. For treatment visits, patients can not have anyone with them due to social distancing guidelines and our immunocompromised population.

## 2024-03-31 NOTE — Progress Notes (Signed)
 [facility] [address] [place] [address]    Clinic Day:  03/31/2024  Referring physician: Cook, Jayce G, DO  Patient Care Team: Cook, Jayce G, DO as PCP - General (Family Medicine) Blinda Ferry, MD as Consulting Physician (Psychiatry) Joshua Pao, NP as Nurse Practitioner (Psychiatry) Rogers Hai, MD as Medical Oncologist (Medical Oncology) Celestia Joesph SQUIBB, RN as Oncology Nurse Navigator (Medical Oncology)   ASSESSMENT & PLAN:   Assessment: 1.  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 - Bilateral diagnostic mammogram/ultrasound (08/27/2023): 2.1 cm mass in the left breast 3 o'clock position.  No lymphadenopathy in the left axilla. 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  Ki-67 5%.  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 - Left breast lumpectomy and SLNB (09/26/2023) by Dr. Bridges - Pathology: 2.4 cm grade 2 IDC, margins negative, negative LVI/PNI.  0/3 lymph nodes involved with cancer.  pT2 pN0. - 6 cycles of docetaxel , carboplatin  and Herceptin  (TCH) from 11/04/2023 through 02/18/2024, with Herceptin  ongoing - Anastrozole  started on 03/31/2024 - XRT to the left breast and chest wall to start during the week of 04/06/2024   2.  Social/family history: - Lives at home by herself and is independent of ADLs and IADLs.  She retired after working at [employer].  She quit smoking 1 year ago.  Smoked half pack per day on and off since age 5.  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 - Paternal aunt died of cancer.  Type not known to the patient.   3.  History of stroke and DVT: - CVA at age 69.  DVT in March 2024 after fracture, treated with Eliquis and was taken off due to side effects.    Plan: 1.  T2 N0 G2 left breast ER/HER2 positive IDC: - She has completed 6 cycles of chemotherapy. - She  met with Dr. Dannielle and will start XRT next week for total of 4 weeks. - She reported some pain in the port site which lasted about 2 days and improved.  She also reported worsening dyspnea on exertion.  O2 sats are 90% on room air. - Lungs are clear to auscultation. - Will hold her treatment today and obtain a 2D echocardiogram.  Will also order chest x-ray today.  Denies any worsening of baseline cough. - We talked about initiating her on anastrozole  1 mg tablet daily for at least 5 years.  We discussed side effects including vasomotor symptoms, musculoskeletal symptoms, decreased bone mineral density among others.  She will start taking it tomorrow.  I will reassess her in 2 weeks.   2.  Osteoporosis (DEXA 07/31/2023 T -4.3): - Received the first dose of Reclast  on 10/16/2023.  Will check vitamin D  levels intermittently.   3.  History of stroke and DVT: - She is not on any anticoagulation at this time.  Will consider antiphospholipid syndrome testing in the future.   4.  High risk drug monitoring: - 2D echo from 01/20/2024: LVEF 60 to 65%.  No signs of PND or orthopnea.  However she has dyspnea on exertion which is worse recently.  Will repeat another echocardiogram as soon as possible.    Orders Placed This Encounter  Procedures   DG Chest 2 View    Standing Status:   Future  Number of Occurrences:   1    Expected Date:   03/31/2024    Expiration Date:   03/31/2025    Reason for Exam (SYMPTOM  OR DIAGNOSIS REQUIRED):   shortness of breath    Preferred imaging location?:   [facility]   ECHOCARDIOGRAM COMPLETE    Standing Status:   Future    Expected Date:   04/07/2024    Expiration Date:   03/31/2025    Where should this test be performed:   [facility]    Perflutren DEFINITY (image enhancing agent) should be administered unless hypersensitivity or allergy  exist:   Administer Perflutren    Reason for exam-Echo:   Chemo  Z09      I,Helena R Teague,acting as a scribe for Alean Stands, MD.,have documented all relevant documentation on the behalf of Alean Stands, MD,as directed by  Alean Stands, MD while in the presence of Alean Stands, MD.  I, Alean Stands MD, have reviewed the above documentation for accuracy and completeness, and I agree with the above.     Alean Stands, MD   7/8/20252:38 PM  CHIEF COMPLAINT:   Diagnosis:  left breast cancer, ER+/HER2+    Cancer Staging  Breast cancer of upper-outer quadrant of left female breast [facility]) Staging form: Breast, AJCC 8th Edition - Clinical stage from 09/03/2023: cT2, cN0, cM0, G2, PR-, HER2+ - Unsigned    Prior Therapy: left lumpectomy, 09/26/23, 6 cycles of TCH  Current Therapy: Maintenance Herceptin , anastrozole  and XRT   HISTORY OF PRESENT ILLNESS:   Oncology History  Breast cancer of upper-outer quadrant of left female breast (HCC)  09/03/2023 Initial Diagnosis   Breast cancer of upper-outer quadrant of left female breast (HCC)   11/04/2023 -  Chemotherapy   Patient is on Treatment Plan : BREAST Docetaxel  + Carboplatin  + Trastuzumab  (TCH) q21d / Trastuzumab  q21d        INTERVAL HISTORY:   Brailey is a 73 y.o. female presenting to clinic today for follow up of left breast cancer, ER+/HER2+. She was last seen by me on 02/18/24.  Today, she states that she is doing well overall. Her appetite level is at 100%. Her energy level is at 60-70%. Emmajean starts radiation next week with Dr. Dannielle, which will occur 5 days a week for 4 weeks.   She tolerated Herceptin  3 weeks well, though she does note SOB. Shawanda states she cannot do daily activities like dressing and walking to the mailbox without feeling SOB. She reports a normal cough.   She notes 2 days of pain at her port site, worsened with certain arm movements, that resolved on its own.   PAST MEDICAL HISTORY:   Past Medical History: Past Medical History:  Diagnosis Date   Anemia    Bipolar 1 disorder (HCC)     Bipolar 1 disorder (HCC)    Complication of anesthesia    Headache    History of chicken pox    History of measles    History of mumps    PONV (postoperative nausea and vomiting)    Stroke (HCC) 1975   Urine retention    takes Flomax     Surgical History: Past Surgical History:  Procedure Laterality Date   ABDOMINAL HYSTERECTOMY  09/24/1977   age 66 due to vaginal bleeding   BREAST BIOPSY Left 08/30/2023   US  LT BREAST BX W LOC DEV 1ST LESION IMG BX SPEC US  GUIDE 08/30/2023 [facility] MAMMOGRAPHY   BREAST LUMPECTOMY Left 09/26/2023  COLONOSCOPY N/A 10/30/2017   Procedure: COLONOSCOPY;  Surgeon: Golda Claudis PENNER, MD;  Location: [facility];  Service: Endoscopy;  Laterality: N/A;  1225   COLONOSCOPY WITH PROPOFOL  N/A 10/13/2022   Procedure: COLONOSCOPY WITH PROPOFOL ;  Surgeon: Eartha Angelia Sieving, MD;  Location: [facility];  Service: Gastroenterology;  Laterality: N/A;   ESOPHAGOGASTRODUODENOSCOPY (EGD) WITH PROPOFOL  N/A 10/13/2022   Procedure: ESOPHAGOGASTRODUODENOSCOPY (EGD) WITH PROPOFOL ;  Surgeon: Eartha Angelia Sieving, MD;  Location: [facility];  Service: Gastroenterology;  Laterality: N/A;   ORIF TIBIA PLATEAU Right 12/12/2022   Procedure: OPEN REDUCTION INTERNAL FIXATION (ORIF) TIBIAL PLATEAU;  Surgeon: Kendal Franky SQUIBB, MD;  Location: [facility] OR;  Service: Orthopedics;  Laterality: Right;   PARTIAL MASTECTOMY WITH AXILLARY SENTINEL LYMPH NODE BIOPSY Left 09/26/2023   Procedure: PARTIAL MASTECTOMY WITH AXILLARY SENTINEL LYMPH NODE BIOPSY;  Surgeon: Kallie Manuelita BROCKS, MD;  Location: [facility] OR[facility];  Service: General;  Laterality: Left;   PERIPHERAL VASCULAR THROMBECTOMY Right 11/2022   2 DVT following surgical intervention of fracture   POLYPECTOMY  10/13/2022   Procedure: POLYPECTOMY;  Surgeon: Eartha Angelia Sieving, MD;  Location: [facility];  Service: Gastroenterology;;   PORTACATH PLACEMENT Right 11/01/2023   Procedure: INSERTION PORT-A-CATH;  Surgeon: Kallie Manuelita BROCKS, MD;  Location: [facility];  Service: General;  Laterality: Right;    Social History: Social History   Socioeconomic History   Marital status: Widowed    Spouse name: Not on file   Number of children: 1   Years of education: Not on file   Highest education level: Some college, no degree  Occupational History   Occupation: Disablity    Comment: Due to back problems  Tobacco Use   Smoking status: Former    Current packs/day: 0.00    Average packs/day: 1 pack/day for 15.0 years (15.0 ttl pk-yrs)    Types: Cigarettes    Start date: 09/25/1983    Quit date: 09/24/1998    Years since quitting: 25.5   Smokeless tobacco: Never  Vaping Use   Vaping status: Never Used  Substance and Sexual Activity   Alcohol use: No    Alcohol/week: 0.0 standard drinks of alcohol   Drug use: No   Sexual activity: Not on file  Other Topics Concern   Not on file  Social History Narrative   Not on file   Social Drivers of Health   Financial Resource Strain: Low Risk  (08/16/2023)   Overall Financial Resource Strain (CARDIA)    Difficulty of Paying Living Expenses: Not hard at all  Food Insecurity: No Food Insecurity (03/04/2024)   Received from [facility]   Hunger Vital Sign    Within the past 12 months, you worried that your food would run out before you got the money to buy more.: Never true    Within the past 12 months, the food you bought just didn't last and you didn't have money to get more.: Never true  Transportation Needs: No Transportation Needs (03/04/2024)   Received from [facility] - Transportation    In the past 12 months, has lack of transportation kept you from medical appointments or from getting medications?: No    In the past 12 months, has lack of transportation kept you from meetings, work, or from getting things needed for daily living?: No  Physical Activity: Inactive (08/16/2023)   Exercise Vital Sign    Days of Exercise per Week: 0 days    Minutes  of  Exercise per Session: 30 min  Stress: Stress Concern Present (08/16/2023)   [employer] of Occupational Health - Occupational Stress Questionnaire    Feeling of Stress : To some extent  Social Connections: Moderately Integrated (08/16/2023)   Social Connection and Isolation Panel    Frequency of Communication with Friends and Family: More than three times a week    Frequency of Social Gatherings with Friends and Family: More than three times a week    Attends Religious Services: 1 to 4 times per year    Active Member of [employer] or Organizations: No    Attends Engineer, structural: More than 4 times per year    Marital Status: Widowed  Intimate Partner Violence: Not At Risk (05/10/2023)   Humiliation, Afraid, Rape, and Kick questionnaire    Fear of Current or Ex-Partner: No    Emotionally Abused: No    Physically Abused: No    Sexually Abused: No    Family History: Family History  Problem Relation Age of Onset   Stroke Mother 6   Stroke Father 29   Colon cancer Neg Hx     Current Medications:  Current Outpatient Medications:    anastrozole  (ARIMIDEX ) 1 MG tablet, Take 1 tablet (1 mg total) by mouth daily., Disp: 30 tablet, Rfl: 5   acetaminophen  (TYLENOL ) 500 MG tablet, Take 500 mg by mouth every 6 (six) hours as needed for mild pain, moderate pain or headache., Disp: , Rfl:    calcium -vitamin D  (OSCAL WITH D) 500-5 MG-MCG tablet, Take 1 tablet by mouth 2 (two) times daily with a meal., Disp: 180 tablet, Rfl: 1   CARBOPLATIN  IV, Inject into the vein., Disp: , Rfl:    Cholecalciferol  (VITAMIN D ) 50 MCG (2000 UT) tablet, Take 2,000 Units by mouth daily., Disp: , Rfl:    DOCEtaxel  (TAXOTERE  IV), Inject into the vein., Disp: , Rfl:    gabapentin  (NEURONTIN ) 100 MG capsule, Take 100 mg by mouth 3 (three) times daily., Disp: , Rfl:    lidocaine -prilocaine  (EMLA ) cream, Apply to affected area once, Disp: 30 g, Rfl: 3   melatonin 5 MG TABS, Take 5 mg by mouth at bedtime  as needed (sleep)., Disp: , Rfl:    Melatonin-Pyridoxine 5-1 MG TABS, Take 5 mg by mouth., Disp: , Rfl:    Multiple Vitamins-Minerals (MULTIVITAMIN WITH MINERALS) tablet, Take 1 tablet by mouth daily., Disp: , Rfl:    prochlorperazine  (COMPAZINE ) 10 MG tablet, Take 1 tablet (10 mg total) by mouth every 6 (six) hours as needed for nausea or vomiting., Disp: 60 tablet, Rfl: 3   rosuvastatin  (CRESTOR ) 5 MG tablet, Take 1 tablet (5 mg total) by mouth daily., Disp: 90 tablet, Rfl: 3   sertraline  (ZOLOFT ) 50 MG tablet, Take 50 mg by mouth at bedtime., Disp: , Rfl:    tamsulosin  (FLOMAX ) 0.4 MG CAPS capsule, Take 1 capsule (0.4 mg total) by mouth at bedtime., Disp: 90 capsule, Rfl: 3   traMADol  (ULTRAM ) 50 MG tablet, Take 1 tablet (50 mg total) by mouth every 6 (six) hours as needed for severe pain (pain score 7-10)., Disp: 5 tablet, Rfl: 0   Trastuzumab -anns (KANJINTI  IV), Inject into the vein., Disp: , Rfl:    Allergies: Allergies  Allergen Reactions   Asa [Aspirin] Other (See Comments)    Had GI bleed 09/2022   Eliquis [Apixaban]     Severe headaches    Oxycodone      Agitation, caused bipolar episode     REVIEW OF  SYSTEMS:   Review of Systems  Constitutional:  Negative for chills, fatigue and fever.  HENT:   Negative for lump/mass, mouth sores, nosebleeds, sore throat and trouble swallowing.   Eyes:  Negative for eye problems.  Respiratory:  Positive for shortness of breath. Negative for cough.   Cardiovascular:  Negative for chest pain, leg swelling and palpitations.  Gastrointestinal:  Negative for abdominal pain, constipation, diarrhea, nausea and vomiting.  Genitourinary:  Negative for bladder incontinence, difficulty urinating, dysuria, frequency, hematuria and nocturia.   Musculoskeletal:  Negative for arthralgias, back pain, flank pain, myalgias and neck pain.       +pain at port site, 6/10 severity  Skin:  Negative for itching and rash.  Neurological:  Negative for dizziness,  headaches and numbness.  Hematological:  Does not bruise/bleed easily.  Psychiatric/Behavioral:  Negative for depression, sleep disturbance and suicidal ideas. The patient is not nervous/anxious.   All other systems reviewed and are negative.    VITALS:   There were no vitals taken for this visit.  Wt Readings from Last 3 Encounters:  03/31/24 111 lb 3.2 oz (50.4 kg)  03/10/24 110 lb 7.2 oz (50.1 kg)  03/09/24 111 lb (50.3 kg)    There is no height or weight on file to calculate BMI.  Performance status (ECOG): 1 - Symptomatic but completely ambulatory  PHYSICAL EXAM:   Physical Exam Vitals and nursing note reviewed. Exam conducted with a chaperone present.  Constitutional:      Appearance: Normal appearance.  Cardiovascular:     Rate and Rhythm: Normal rate and regular rhythm.     Pulses: Normal pulses.     Heart sounds: Normal heart sounds.  Pulmonary:     Effort: Pulmonary effort is normal.     Breath sounds: Normal breath sounds.  Abdominal:     Palpations: Abdomen is soft. There is no hepatomegaly, splenomegaly or mass.     Tenderness: There is no abdominal tenderness.  Musculoskeletal:     Right lower leg: No edema.     Left lower leg: No edema.  Lymphadenopathy:     Cervical: No cervical adenopathy.     Right cervical: No superficial, deep or posterior cervical adenopathy.    Left cervical: No superficial, deep or posterior cervical adenopathy.     Upper Body:     Right upper body: No supraclavicular or axillary adenopathy.     Left upper body: No supraclavicular or axillary adenopathy.  Neurological:     General: No focal deficit present.     Mental Status: She is alert and oriented to person, place, and time.  Psychiatric:        Mood and Affect: Mood normal.        Behavior: Behavior normal.     LABS:   CBC     Component Value Date/Time   WBC 5.5 03/31/2024 1306   RBC 4.17 03/31/2024 1306   HGB 13.6 03/31/2024 1306   HGB 15.0 08/20/2023 1122    HCT 41.6 03/31/2024 1306   HCT 46.4 08/20/2023 1122   PLT 164 03/31/2024 1306   PLT 228 08/20/2023 1122   MCV 99.8 03/31/2024 1306   MCV 95 08/20/2023 1122   MCH 32.6 03/31/2024 1306   MCHC 32.7 03/31/2024 1306   RDW 14.5 03/31/2024 1306   RDW 12.6 08/20/2023 1122   LYMPHSABS 1.6 03/31/2024 1306   MONOABS 0.2 03/31/2024 1306   EOSABS 0.1 03/31/2024 1306   BASOSABS 0.1 03/31/2024 1306    CMP  Component Value Date/Time   NA 140 03/31/2024 1306   NA 147 (H) 08/20/2023 1122   K 3.8 03/31/2024 1306   CL 109 03/31/2024 1306   CO2 25 03/31/2024 1306   GLUCOSE 159 (H) 03/31/2024 1306   BUN 12 03/31/2024 1306   BUN 12 08/20/2023 1122   CREATININE 0.50 03/31/2024 1306   CALCIUM  8.6 (L) 03/31/2024 1306   PROT 6.3 (L) 03/31/2024 1306   PROT 6.5 08/20/2023 1122   ALBUMIN 3.5 03/31/2024 1306   ALBUMIN 4.1 08/20/2023 1122   AST 21 03/31/2024 1306   ALT 9 03/31/2024 1306   ALKPHOS 73 03/31/2024 1306   BILITOT 0.5 03/31/2024 1306   BILITOT <0.2 08/20/2023 1122   GFRNONAA >60 03/31/2024 1306   GFRAA >60 05/29/2016 1145     No results found for: CEA1, CEA / No results found for: CEA1, CEA No results found for: PSA1 No results found for: CAN199 No results found for: CAN125  No results found for: TOTALPROTELP, ALBUMINELP, A1GS, A2GS, BETS, BETA2SER, GAMS, MSPIKE, SPEI No results found for: TIBC, FERRITIN, IRONPCTSAT No results found for: LDH   STUDIES:   No results found.

## 2024-03-31 NOTE — Progress Notes (Signed)
 Per MD to hold treatment today due to SOB. New orders placed by MD and new appointments given to patient. Port deaccessed. All appointments as scheduled.   Fernado Brigante

## 2024-04-06 DIAGNOSIS — C50412 Malignant neoplasm of upper-outer quadrant of left female breast: Secondary | ICD-10-CM | POA: Diagnosis not present

## 2024-04-07 DIAGNOSIS — C50412 Malignant neoplasm of upper-outer quadrant of left female breast: Secondary | ICD-10-CM | POA: Diagnosis not present

## 2024-04-08 DIAGNOSIS — C50412 Malignant neoplasm of upper-outer quadrant of left female breast: Secondary | ICD-10-CM | POA: Diagnosis not present

## 2024-04-09 DIAGNOSIS — C50412 Malignant neoplasm of upper-outer quadrant of left female breast: Secondary | ICD-10-CM | POA: Diagnosis not present

## 2024-04-10 ENCOUNTER — Ambulatory Visit (HOSPITAL_COMMUNITY)
Admission: RE | Admit: 2024-04-10 | Discharge: 2024-04-10 | Disposition: A | Discharge status: Home / Self Care | Source: Ambulatory Visit | Attending: Family Medicine | Admitting: Family Medicine

## 2024-04-10 DIAGNOSIS — R0600 Dyspnea, unspecified: Secondary | ICD-10-CM | POA: Insufficient documentation

## 2024-04-10 DIAGNOSIS — R0609 Other forms of dyspnea: Secondary | ICD-10-CM

## 2024-04-10 LAB — ECHOCARDIOGRAM COMPLETE
AR max vel: 2.25 cm2
AV Area VTI: 2.49 cm2
AV Area mean vel: 2.27 cm2
AV Mean grad: 2.6 mmHg
AV Peak grad: 6.8 mmHg
Ao pk vel: 1.3 m/s
Area-P 1/2: 4.31 cm2
S' Lateral: 2.7 cm

## 2024-04-10 NOTE — Progress Notes (Signed)
*  PRELIMINARY RESULTS* Echocardiogram 2D Echocardiogram has been performed.  Pamela Henson 04/10/2024, 12:44 PM

## 2024-04-13 DIAGNOSIS — C50412 Malignant neoplasm of upper-outer quadrant of left female breast: Secondary | ICD-10-CM | POA: Diagnosis not present

## 2024-04-14 DIAGNOSIS — C50412 Malignant neoplasm of upper-outer quadrant of left female breast: Secondary | ICD-10-CM | POA: Diagnosis not present

## 2024-04-14 NOTE — Progress Notes (Signed)
 [facility] [address], [place] [address]    Clinic Day:  04/15/2024  Referring physician: Cook, Jayce G, DO  Patient Care Team: Cook, Jayce G, DO as PCP - General (Family Medicine) Blinda Ferry, MD as Consulting Physician (Psychiatry) Joshua Pao, NP as Nurse Practitioner (Psychiatry) Rogers Hai, MD as Medical Oncologist (Medical Oncology) Celestia Joesph SQUIBB, RN as Oncology Nurse Navigator (Medical Oncology)   ASSESSMENT & PLAN:   Assessment: 1.  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 - Bilateral diagnostic mammogram/ultrasound (08/27/2023): 2.1 cm mass in the left breast 3 o'clock position.  No lymphadenopathy in the left axilla. 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  Ki-67 5%.  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 - Left breast lumpectomy and SLNB (09/26/2023) by Dr. Bridges - Pathology: 2.4 cm grade 2 IDC, margins negative, negative LVI/PNI.  0/3 lymph nodes involved with cancer.  pT2 pN0. - 6 cycles of docetaxel , carboplatin  and Herceptin  (TCH) from 11/04/2023 through 02/18/2024, with Herceptin  ongoing - Anastrozole  started on 03/31/2024 - XRT to the left breast and chest wall to start during the week of 04/06/2024   2.  Social/family history: - Lives at home by herself and is independent of ADLs and IADLs.  She retired after working at [employer].  She quit smoking 1 year ago.  Smoked half pack per day on and off since age 60.  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 - Paternal aunt died of cancer.  Type not known to the patient.   3.  History of stroke and DVT: - CVA at age 70.  DVT in March 2024 after fracture, treated with Eliquis and was taken off due to side effects.    Plan: 1.  T2 N0 G2 left breast ER/HER2 positive IDC: - She has completed 6 treatments of radiation  therapy. - I have started her on anastrozole  on 03/31/2024.  She is tolerating it very well. - I held her Herceptin  at last visit due to worsening dyspnea on exertion.  She reports that her breathing is better at this time. - I reviewed labs today: Normal CBC. - Chest x-ray from 03/31/2024 with no acute findings.  I have also reviewed her 2D echocardiogram which was normal. - Recommend that she restart Herceptin  today and continue it every 3 weeks until end of January 2026.  Continue anastrozole . - She will be reevaluated in 12 weeks with repeat echocardiogram and labs.   2.  Osteoporosis (DEXA 07/31/2023 T -4.3): - Continue Reclast  once a year.  Last dose was on 10/16/2023.  Will check vitamin D  level prior to next visit.   3.  History of stroke and DVT: - She is not on any anticoagulation at this time.  Will do workup for antiphospholipid syndrome prior to next visit.   4.  High risk drug monitoring: - 2D echo from 01/20/2024 with LVEF 60 to 65%. - Reported recent worsening of dyspnea on exertion.  I have repeated 2D echo on 04/10/2024 with LVEF 65 to 70%.  Reports shortness of breath has improved.  Will continue close monitoring with LVEF evaluation every 3 months.    Orders Placed This Encounter  Procedures   Beta-2-glycoprotein i abs, IgG/M/A    Standing Status:   Future    Expected Date:  07/14/2024    Expiration Date:   10/12/2024   Cardiolipin antibodies, IgG, IgM, IgA    Standing Status:   Future    Expected Date:   07/14/2024    Expiration Date:   10/12/2024   Lupus anticoagulant panel    Standing Status:   Future    Expected Date:   07/14/2024    Expiration Date:   10/12/2024   CBC with Differential    Standing Status:   Future    Expected Date:   07/14/2024    Expiration Date:   10/12/2024   Comprehensive metabolic panel    Standing Status:   Future    Expected Date:   07/14/2024    Expiration Date:   10/12/2024   VITAMIN D  25 Hydroxy (Vit-D Deficiency, Fractures)     Standing Status:   Future    Expected Date:   07/14/2024    Expiration Date:   10/12/2024   ECHOCARDIOGRAM COMPLETE    Standing Status:   Future    Expiration Date:   04/15/2025    Where should this test be performed:   [facility]    Perflutren DEFINITY (image enhancing agent) should be administered unless hypersensitivity or allergy  exist:   Administer Perflutren    Reason for exam-Echo:   Chemo  Z09      I,Helena R Teague,acting as a scribe for Alean Stands, MD.,have documented all relevant documentation on the behalf of Alean Stands, MD,as directed by  Alean Stands, MD while in the presence of Alean Stands, MD.  I, Alean Stands MD, have reviewed the above documentation for accuracy and completeness, and I agree with the above.      Alean Stands, MD   7/23/20254:18 PM  CHIEF COMPLAINT:   Diagnosis:  left breast cancer, ER+/HER2+    Cancer Staging  Breast cancer of upper-outer quadrant of left female breast [facility]) Staging form: Breast, AJCC 8th Edition - Clinical stage from 09/03/2023: cT2, cN0, cM0, G2, PR-, HER2+ - Unsigned    Prior Therapy: left lumpectomy, 09/26/23, 6 cycles of TCH  Current Therapy: Maintenance Herceptin , anastrozole  and XRT   HISTORY OF PRESENT ILLNESS:   Oncology History  Breast cancer of upper-outer quadrant of left female breast (HCC)  09/03/2023 Initial Diagnosis   Breast cancer of upper-outer quadrant of left female breast (HCC)   11/04/2023 -  Chemotherapy   Patient is on Treatment Plan : BREAST Docetaxel  + Carboplatin  + Trastuzumab  (TCH) q21d / Trastuzumab  q21d        INTERVAL HISTORY:   Pamela Henson is a 73 y.o. female presenting to clinic today for follow up of left breast cancer, ER+/HER2+. She was last seen by me on 03/31/2024.  Today, she states that she is doing well overall. Her appetite level is at 100%. Her energy level is at 65%.   PAST MEDICAL HISTORY:   Past Medical History: Past Medical  History:  Diagnosis Date   Anemia    Bipolar 1 disorder (HCC)    Bipolar 1 disorder (HCC)    Complication of anesthesia    Headache    History of chicken pox    History of measles    History of mumps    PONV (postoperative nausea and vomiting)    Stroke (HCC) 1975   Urine retention    takes Flomax     Surgical History: Past Surgical History:  Procedure Laterality Date   ABDOMINAL HYSTERECTOMY  09/24/1977   age 46 due to vaginal bleeding   BREAST BIOPSY  Left 08/30/2023   US  LT BREAST BX W LOC DEV 1ST LESION IMG BX SPEC US  GUIDE 08/30/2023 [facility] MAMMOGRAPHY   BREAST LUMPECTOMY Left 09/26/2023   COLONOSCOPY N/A 10/30/2017   Procedure: COLONOSCOPY;  Surgeon: Golda Claudis PENNER, MD;  Location: [facility];  Service: Endoscopy;  Laterality: N/A;  1225   COLONOSCOPY WITH PROPOFOL  N/A 10/13/2022   Procedure: COLONOSCOPY WITH PROPOFOL ;  Surgeon: Eartha Angelia Sieving, MD;  Location: [facility];  Service: Gastroenterology;  Laterality: N/A;   ESOPHAGOGASTRODUODENOSCOPY (EGD) WITH PROPOFOL  N/A 10/13/2022   Procedure: ESOPHAGOGASTRODUODENOSCOPY (EGD) WITH PROPOFOL ;  Surgeon: Eartha Angelia Sieving, MD;  Location: [facility];  Service: Gastroenterology;  Laterality: N/A;   ORIF TIBIA PLATEAU Right 12/12/2022   Procedure: OPEN REDUCTION INTERNAL FIXATION (ORIF) TIBIAL PLATEAU;  Surgeon: Kendal Franky SQUIBB, MD;  Location: [facility] OR;  Service: Orthopedics;  Laterality: Right;   PARTIAL MASTECTOMY WITH AXILLARY SENTINEL LYMPH NODE BIOPSY Left 09/26/2023   Procedure: PARTIAL MASTECTOMY WITH AXILLARY SENTINEL LYMPH NODE BIOPSY;  Surgeon: Kallie Manuelita BROCKS, MD;  Location: [facility];  Service: General;  Laterality: Left;   PERIPHERAL VASCULAR THROMBECTOMY Right 11/2022   2 DVT following surgical intervention of fracture   POLYPECTOMY  10/13/2022   Procedure: POLYPECTOMY;  Surgeon: Eartha Angelia Sieving, MD;  Location: [facility];  Service: Gastroenterology;;   PORTACATH PLACEMENT Right  11/01/2023   Procedure: INSERTION PORT-A-CATH;  Surgeon: Kallie Manuelita BROCKS, MD;  Location: [facility];  Service: General;  Laterality: Right;    Social History: Social History   Socioeconomic History   Marital status: Widowed    Spouse name: Not on file   Number of children: 1   Years of education: Not on file   Highest education level: Some college, no degree  Occupational History   Occupation: Disablity    Comment: Due to back problems  Tobacco Use   Smoking status: Former    Current packs/day: 0.00    Average packs/day: 1 pack/day for 15.0 years (15.0 ttl pk-yrs)    Types: Cigarettes    Start date: 09/25/1983    Quit date: 09/24/1998    Years since quitting: 25.5   Smokeless tobacco: Never  Vaping Use   Vaping status: Never Used  Substance and Sexual Activity   Alcohol use: No    Alcohol/week: 0.0 standard drinks of alcohol   Drug use: No   Sexual activity: Not on file  Other Topics Concern   Not on file  Social History Narrative   Not on file   Social Drivers of Health   Financial Resource Strain: Low Risk  (08/16/2023)   Overall Financial Resource Strain (CARDIA)    Difficulty of Paying Living Expenses: Not hard at all  Food Insecurity: No Food Insecurity (03/04/2024)   Received from [facility]   Hunger Vital Sign    Within the past 12 months, you worried that your food would run out before you got the money to buy more.: Never true    Within the past 12 months, the food you bought just didn't last and you didn't have money to get more.: Never true  Transportation Needs: No Transportation Needs (03/04/2024)   Received from [facility] - Transportation    In the past 12 months, has lack of transportation kept you from medical appointments or from getting medications?: No    In the past 12 months, has lack of transportation kept you from meetings, work, or from getting things needed for  daily living?: No  Physical Activity: Inactive (08/16/2023)    Exercise Vital Sign    Days of Exercise per Week: 0 days    Minutes of Exercise per Session: 30 min  Stress: Stress Concern Present (08/16/2023)   [employer] of Occupational Health - Occupational Stress Questionnaire    Feeling of Stress : To some extent  Social Connections: Moderately Integrated (08/16/2023)   Social Connection and Isolation Panel    Frequency of Communication with Friends and Family: More than three times a week    Frequency of Social Gatherings with Friends and Family: More than three times a week    Attends Religious Services: 1 to 4 times per year    Active Member of [employer] or Organizations: No    Attends Engineer, structural: More than 4 times per year    Marital Status: Widowed  Intimate Partner Violence: Not At Risk (05/10/2023)   Humiliation, Afraid, Rape, and Kick questionnaire    Fear of Current or Ex-Partner: No    Emotionally Abused: No    Physically Abused: No    Sexually Abused: No    Family History: Family History  Problem Relation Age of Onset   Stroke Mother 6   Stroke Father 23   Colon cancer Neg Hx     Current Medications:  Current Outpatient Medications:    acetaminophen  (TYLENOL ) 500 MG tablet, Take 500 mg by mouth every 6 (six) hours as needed for mild pain, moderate pain or headache., Disp: , Rfl:    anastrozole  (ARIMIDEX ) 1 MG tablet, Take 1 tablet (1 mg total) by mouth daily., Disp: 30 tablet, Rfl: 5   calcium -vitamin D  (OSCAL WITH D) 500-5 MG-MCG tablet, Take 1 tablet by mouth 2 (two) times daily with a meal., Disp: 180 tablet, Rfl: 1   CARBOPLATIN  IV, Inject into the vein., Disp: , Rfl:    Cholecalciferol  (VITAMIN D ) 50 MCG (2000 UT) tablet, Take 2,000 Units by mouth daily., Disp: , Rfl:    DOCEtaxel  (TAXOTERE  IV), Inject into the vein., Disp: , Rfl:    gabapentin  (NEURONTIN ) 100 MG capsule, Take 100 mg by mouth 3 (three) times daily., Disp: , Rfl:    lidocaine -prilocaine  (EMLA ) cream, Apply to affected area once,  Disp: 30 g, Rfl: 3   melatonin 5 MG TABS, Take 5 mg by mouth at bedtime as needed (sleep)., Disp: , Rfl:    Melatonin-Pyridoxine 5-1 MG TABS, Take 5 mg by mouth., Disp: , Rfl:    Multiple Vitamins-Minerals (MULTIVITAMIN WITH MINERALS) tablet, Take 1 tablet by mouth daily., Disp: , Rfl:    prochlorperazine  (COMPAZINE ) 10 MG tablet, Take 1 tablet (10 mg total) by mouth every 6 (six) hours as needed for nausea or vomiting., Disp: 60 tablet, Rfl: 3   rosuvastatin  (CRESTOR ) 5 MG tablet, Take 1 tablet (5 mg total) by mouth daily., Disp: 90 tablet, Rfl: 3   sertraline  (ZOLOFT ) 50 MG tablet, Take 50 mg by mouth at bedtime., Disp: , Rfl:    tamsulosin  (FLOMAX ) 0.4 MG CAPS capsule, Take 1 capsule (0.4 mg total) by mouth at bedtime., Disp: 90 capsule, Rfl: 3   traMADol  (ULTRAM ) 50 MG tablet, Take 1 tablet (50 mg total) by mouth every 6 (six) hours as needed for severe pain (pain score 7-10)., Disp: 5 tablet, Rfl: 0   Trastuzumab -anns (KANJINTI  IV), Inject into the vein., Disp: , Rfl:  No current facility-administered medications for this visit.  Facility-Administered Medications Ordered in Other Visits:    0.9 %  sodium chloride  infusion, , Intravenous, Continuous, Rogers Hai, MD, Stopped at 04/15/24 1602   acetaminophen  (TYLENOL ) tablet 650 mg, 650 mg, Oral, Once, Rogers Hai, MD   diphenhydrAMINE  (BENADRYL ) capsule 25 mg, 25 mg, Oral, Once, Rogers Hai, MD   sodium chloride  flush (NS) 0.9 % injection 10 mL, 10 mL, Intracatheter, PRN, Lyrick Lagrand, MD, 10 mL at 04/15/24 1603   Allergies: Allergies  Allergen Reactions   Asa [Aspirin] Other (See Comments)    Had GI bleed 09/2022   Eliquis [Apixaban]     Severe headaches    Oxycodone      Agitation, caused bipolar episode     REVIEW OF SYSTEMS:   Review of Systems  Constitutional:  Negative for chills, fatigue and fever.  HENT:   Negative for lump/mass, mouth sores, nosebleeds, sore throat and trouble swallowing.    Eyes:  Negative for eye problems.  Respiratory:  Positive for shortness of breath. Negative for cough.   Cardiovascular:  Negative for chest pain, leg swelling and palpitations.  Gastrointestinal:  Negative for abdominal pain, constipation, diarrhea, nausea and vomiting.  Genitourinary:  Negative for bladder incontinence, difficulty urinating, dysuria, frequency, hematuria and nocturia.   Musculoskeletal:  Negative for arthralgias, back pain, flank pain, myalgias and neck pain.  Skin:  Negative for itching and rash.  Neurological:  Negative for dizziness, headaches and numbness.  Hematological:  Does not bruise/bleed easily.  Psychiatric/Behavioral:  Negative for depression, sleep disturbance and suicidal ideas. The patient is nervous/anxious.   All other systems reviewed and are negative.    VITALS:   There were no vitals taken for this visit.  Wt Readings from Last 3 Encounters:  04/15/24 109 lb 9.6 oz (49.7 kg)  03/31/24 111 lb 3.2 oz (50.4 kg)  03/10/24 110 lb 7.2 oz (50.1 kg)    There is no height or weight on file to calculate BMI.  Performance status (ECOG): 1 - Symptomatic but completely ambulatory  PHYSICAL EXAM:   Physical Exam Vitals and nursing note reviewed. Exam conducted with a chaperone present.  Constitutional:      Appearance: Normal appearance.  Cardiovascular:     Rate and Rhythm: Normal rate and regular rhythm.     Pulses: Normal pulses.     Heart sounds: Normal heart sounds.  Pulmonary:     Effort: Pulmonary effort is normal.     Breath sounds: Normal breath sounds.  Abdominal:     Palpations: Abdomen is soft. There is no hepatomegaly, splenomegaly or mass.     Tenderness: There is no abdominal tenderness.  Musculoskeletal:     Right lower leg: No edema.     Left lower leg: No edema.  Lymphadenopathy:     Cervical: No cervical adenopathy.     Right cervical: No superficial, deep or posterior cervical adenopathy.    Left cervical: No superficial,  deep or posterior cervical adenopathy.     Upper Body:     Right upper body: No supraclavicular or axillary adenopathy.     Left upper body: No supraclavicular or axillary adenopathy.  Neurological:     General: No focal deficit present.     Mental Status: She is alert and oriented to person, place, and time.  Psychiatric:        Mood and Affect: Mood normal.        Behavior: Behavior normal.     LABS:   CBC     Component Value Date/Time   WBC 6.2 04/15/2024 1300   RBC 4.44  04/15/2024 1300   HGB 14.3 04/15/2024 1300   HGB 15.0 08/20/2023 1122   HCT 44.4 04/15/2024 1300   HCT 46.4 08/20/2023 1122   PLT 177 04/15/2024 1300   PLT 228 08/20/2023 1122   MCV 100.0 04/15/2024 1300   MCV 95 08/20/2023 1122   MCH 32.2 04/15/2024 1300   MCHC 32.2 04/15/2024 1300   RDW 14.2 04/15/2024 1300   RDW 12.6 08/20/2023 1122   LYMPHSABS 1.8 04/15/2024 1300   MONOABS 0.4 04/15/2024 1300   EOSABS 0.1 04/15/2024 1300   BASOSABS 0.0 04/15/2024 1300    CMP      Component Value Date/Time   NA 140 03/31/2024 1306   NA 147 (H) 08/20/2023 1122   K 3.8 03/31/2024 1306   CL 109 03/31/2024 1306   CO2 25 03/31/2024 1306   GLUCOSE 159 (H) 03/31/2024 1306   BUN 12 03/31/2024 1306   BUN 12 08/20/2023 1122   CREATININE 0.50 03/31/2024 1306   CALCIUM  8.6 (L) 03/31/2024 1306   PROT 6.3 (L) 03/31/2024 1306   PROT 6.5 08/20/2023 1122   ALBUMIN 3.5 03/31/2024 1306   ALBUMIN 4.1 08/20/2023 1122   AST 21 03/31/2024 1306   ALT 9 03/31/2024 1306   ALKPHOS 73 03/31/2024 1306   BILITOT 0.5 03/31/2024 1306   BILITOT <0.2 08/20/2023 1122   GFRNONAA >60 03/31/2024 1306   GFRAA >60 05/29/2016 1145     No results found for: CEA1, CEA / No results found for: CEA1, CEA No results found for: PSA1 No results found for: CAN199 No results found for: CAN125  No results found for: TOTALPROTELP, ALBUMINELP, A1GS, A2GS, BETS, BETA2SER, GAMS, MSPIKE, SPEI No results found for:  TIBC, FERRITIN, IRONPCTSAT No results found for: LDH   STUDIES:   ECHOCARDIOGRAM COMPLETE Result Date: 04/10/2024    ECHOCARDIOGRAM REPORT   Patient Name:   AAMNA MALLOZZI [facility] Date of Exam: 04/10/2024 Medical Rec #:  [id number]        Height:       63.0 in Accession #:    [id number]       Weight:       111.2 lb Date of Birth:  1950-12-13         BSA:          1.507 m Patient Age:    73 years         BP:           128/74 mmHg Patient Gender: F                HR:           59 bpm. Exam Location:  [facility] Procedure: 2D Echo, Cardiac Doppler, Color Doppler and Strain Analysis (Both            Spectral and Color Flow Doppler were utilized during procedure). Indications:    Chemo Z09  History:        Patient has prior history of Echocardiogram examinations, most                 recent 01/20/2024. Stroke; Risk Factors:Former Smoker and                 Dyslipidemia. Breast cancer of upper-outer quadrant of left                 female breast (HCC).  Sonographer:    Aida Pizza RCS Referring Phys: [id number] [facility]  Sonographer Comments: Global longitudinal strain was attempted. IMPRESSIONS  1. Left ventricular ejection fraction, by estimation, is 65 to 70%. The left ventricle has normal function. The left ventricle has no regional wall motion abnormalities. There is mild left ventricular hypertrophy. Left ventricular diastolic parameters were normal.  2. Right ventricular systolic function is normal. The right ventricular size is normal. Tricuspid regurgitation signal is inadequate for assessing PA pressure.  3. Left atrial size was mildly dilated.  4. The mitral valve is abnormal. Mild mitral valve regurgitation. No evidence of mitral stenosis.  5. The aortic valve is tricuspid. Aortic valve regurgitation is not visualized. No aortic stenosis is present.  6. The inferior vena cava is dilated in size with >50% respiratory variability, suggesting right atrial pressure of 8 mmHg. FINDINGS  Left  Ventricle: Left ventricular ejection fraction, by estimation, is 65 to 70%. The left ventricle has normal function. The left ventricle has no regional wall motion abnormalities. Strain was performed and the global longitudinal strain is indeterminate. The left ventricular internal cavity size was normal in size. There is mild left ventricular hypertrophy. Left ventricular diastolic parameters were normal. Right Ventricle: The right ventricular size is normal. Right vetricular wall thickness was not well visualized. Right ventricular systolic function is normal. Tricuspid regurgitation signal is inadequate for assessing PA pressure. Left Atrium: Left atrial size was mildly dilated. Right Atrium: Right atrial size was normal in size. Pericardium: There is no evidence of pericardial effusion. Mitral Valve: The mitral valve is abnormal. There is mild thickening of the mitral valve leaflet(s). There is mild calcification of the mitral valve leaflet(s). Mild mitral annular calcification. Mild mitral valve regurgitation. No evidence of mitral valve stenosis. Tricuspid Valve: The tricuspid valve is normal in structure. Tricuspid valve regurgitation is not demonstrated. No evidence of tricuspid stenosis. Aortic Valve: The aortic valve is tricuspid. Aortic valve regurgitation is not visualized. No aortic stenosis is present. Aortic valve mean gradient measures 2.6 mmHg. Aortic valve peak gradient measures 6.8 mmHg. Aortic valve area, by VTI measures 2.49 cm. Pulmonic Valve: The pulmonic valve was not well visualized. Pulmonic valve regurgitation is trivial. No evidence of pulmonic stenosis. Aorta: The aortic root and ascending aorta are structurally normal, with no evidence of dilitation. Venous: The inferior vena cava is dilated in size with greater than 50% respiratory variability, suggesting right atrial pressure of 8 mmHg. IAS/Shunts: No atrial level shunt detected by color flow Doppler.  LEFT VENTRICLE PLAX 2D LVIDd:          4.80 cm   Diastology LVIDs:         2.70 cm   LV e' medial:    5.87 cm/s LV PW:         1.20 cm   LV E/e' medial:  13.4 LV IVS:        1.20 cm   LV e' lateral:   8.27 cm/s LVOT diam:     2.00 cm   LV E/e' lateral: 9.5 LV SV:         52 LV SV Index:   35 LVOT Area:     3.14 cm  RIGHT VENTRICLE RV S prime:     14.40 cm/s TAPSE (M-mode): 2.1 cm LEFT ATRIUM             Index        RIGHT ATRIUM           Index LA diam:        3.60 cm 2.39 cm/m   RA Area:     13.30 cm  LA Vol (A2C):   58.6 ml 38.89 ml/m  RA Volume:   30.30 ml  20.11 ml/m LA Vol (A4C):   54.7 ml 36.30 ml/m LA Biplane Vol: 57.0 ml 37.83 ml/m  AORTIC VALVE AV Area (Vmax):    2.25 cm AV Area (Vmean):   2.27 cm AV Area (VTI):     2.49 cm AV Vmax:           129.94 cm/s AV Vmean:          73.341 cm/s AV VTI:            0.209 m AV Peak Grad:      6.8 mmHg AV Mean Grad:      2.6 mmHg LVOT Vmax:         93.00 cm/s LVOT Vmean:        53.000 cm/s LVOT VTI:          0.166 m LVOT/AV VTI ratio: 0.79  AORTA Ao Root diam: 3.40 cm Ao Asc diam:  3.10 cm MITRAL VALVE MV Area (PHT): 4.31 cm    SHUNTS MV Decel Time: 176 msec    Systemic VTI:  0.17 m MV E velocity: 78.50 cm/s  Systemic Diam: 2.00 cm MV A velocity: 68.60 cm/s MV E/A ratio:  1.14 Dorn Ross MD Electronically signed by Dorn Ross MD Signature Date/Time: 04/10/2024/1:05:55 PM    Final    DG Chest 2 View Result Date: 03/31/2024 EXAM: 2 VIEW(S) XRAY OF THE CHEST COMPARISON: 11/01/2023 CXR CLINICAL HISTORY: Shortness of breath. Pt c/o sob, hx breast cancer. FINDINGS: LUNGS AND PLEURA: Mild emphysematous changes. No focal pulmonary opacity. No pulmonary edema. No pleural effusion. No pneumothorax. HEART AND MEDIASTINUM: No acute abnormality of the cardiac and mediastinal silhouettes. BONES AND SOFT TISSUES: Left axillary surgical clips. Degenerative spine changes. No acute osseous abnormality. LINES AND TUBES: Right chest port with tip in superior vena cava. IMPRESSION: 1. No acute findings.  Electronically signed by: Pinkie Pebbles MD 03/31/2024 03:00 PM EDT RP Workstation: [id number]

## 2024-04-15 ENCOUNTER — Inpatient Hospital Stay: Admitting: Hematology

## 2024-04-15 ENCOUNTER — Inpatient Hospital Stay

## 2024-04-15 VITALS — BP 129/77 | HR 71 | Temp 97.7°F | Resp 18

## 2024-04-15 VITALS — BP 117/78 | HR 89 | Temp 97.3°F | Resp 18 | Ht 63.0 in | Wt 109.6 lb

## 2024-04-15 DIAGNOSIS — C50412 Malignant neoplasm of upper-outer quadrant of left female breast: Secondary | ICD-10-CM | POA: Diagnosis not present

## 2024-04-15 DIAGNOSIS — Z79899 Other long term (current) drug therapy: Secondary | ICD-10-CM

## 2024-04-15 DIAGNOSIS — M810 Age-related osteoporosis without current pathological fracture: Secondary | ICD-10-CM | POA: Diagnosis not present

## 2024-04-15 DIAGNOSIS — Z8673 Personal history of transient ischemic attack (TIA), and cerebral infarction without residual deficits: Secondary | ICD-10-CM | POA: Diagnosis not present

## 2024-04-15 DIAGNOSIS — Z170 Estrogen receptor positive status [ER+]: Secondary | ICD-10-CM | POA: Diagnosis not present

## 2024-04-15 DIAGNOSIS — Z95828 Presence of other vascular implants and grafts: Secondary | ICD-10-CM

## 2024-04-15 DIAGNOSIS — Z87891 Personal history of nicotine dependence: Secondary | ICD-10-CM | POA: Diagnosis not present

## 2024-04-15 DIAGNOSIS — Z452 Encounter for adjustment and management of vascular access device: Secondary | ICD-10-CM | POA: Diagnosis not present

## 2024-04-15 DIAGNOSIS — Z5112 Encounter for antineoplastic immunotherapy: Secondary | ICD-10-CM | POA: Diagnosis not present

## 2024-04-15 DIAGNOSIS — Z86718 Personal history of other venous thrombosis and embolism: Secondary | ICD-10-CM | POA: Diagnosis not present

## 2024-04-15 DIAGNOSIS — Z1731 Human epidermal growth factor receptor 2 positive status: Secondary | ICD-10-CM | POA: Diagnosis not present

## 2024-04-15 DIAGNOSIS — I639 Cerebral infarction, unspecified: Secondary | ICD-10-CM

## 2024-04-15 DIAGNOSIS — Z79811 Long term (current) use of aromatase inhibitors: Secondary | ICD-10-CM | POA: Diagnosis not present

## 2024-04-15 DIAGNOSIS — R0609 Other forms of dyspnea: Secondary | ICD-10-CM | POA: Diagnosis not present

## 2024-04-15 LAB — CBC WITH DIFFERENTIAL/PLATELET
Abs Immature Granulocytes: 0.01 K/uL (ref 0.00–0.07)
Basophils Absolute: 0 K/uL (ref 0.0–0.1)
Basophils Relative: 1 %
Eosinophils Absolute: 0.1 K/uL (ref 0.0–0.5)
Eosinophils Relative: 2 %
HCT: 44.4 % (ref 36.0–46.0)
Hemoglobin: 14.3 g/dL (ref 12.0–15.0)
Immature Granulocytes: 0 %
Lymphocytes Relative: 29 %
Lymphs Abs: 1.8 K/uL (ref 0.7–4.0)
MCH: 32.2 pg (ref 26.0–34.0)
MCHC: 32.2 g/dL (ref 30.0–36.0)
MCV: 100 fL (ref 80.0–100.0)
Monocytes Absolute: 0.4 K/uL (ref 0.1–1.0)
Monocytes Relative: 7 %
Neutro Abs: 3.8 K/uL (ref 1.7–7.7)
Neutrophils Relative %: 61 %
Platelets: 177 K/uL (ref 150–400)
RBC: 4.44 MIL/uL (ref 3.87–5.11)
RDW: 14.2 % (ref 11.5–15.5)
WBC: 6.2 K/uL (ref 4.0–10.5)
nRBC: 0 % (ref 0.0–0.2)

## 2024-04-15 LAB — COMPREHENSIVE METABOLIC PANEL WITH GFR
ALT: 9 U/L (ref 0–44)
AST: 21 U/L (ref 15–41)
Albumin: 3.8 g/dL (ref 3.5–5.0)
Alkaline Phosphatase: 72 U/L (ref 38–126)
Anion gap: 12 (ref 5–15)
BUN: 11 mg/dL (ref 8–23)
CO2: 23 mmol/L (ref 22–32)
Calcium: 8.8 mg/dL — ABNORMAL LOW (ref 8.9–10.3)
Chloride: 103 mmol/L (ref 98–111)
Creatinine, Ser: 0.57 mg/dL (ref 0.44–1.00)
GFR, Estimated: >60 mL/min (ref >60)
Glucose, Bld: 95 mg/dL (ref 70–99)
Potassium: 4.4 mmol/L (ref 3.5–5.1)
Sodium: 138 mmol/L (ref 135–145)
Total Bilirubin: 0.7 mg/dL (ref 0.0–1.2)
Total Protein: 6.6 g/dL (ref 6.5–8.1)

## 2024-04-15 LAB — MAGNESIUM: Magnesium: 2.2 mg/dL (ref 1.7–2.4)

## 2024-04-15 MED ORDER — TRASTUZUMAB-ANNS CHEMO 150 MG IV SOLR
6.0000 mg/kg | Freq: Once | INTRAVENOUS | Status: AC
  Administered 2024-04-15: 300 mg via INTRAVENOUS
  Filled 2024-04-15: qty 14.29

## 2024-04-15 MED ORDER — DIPHENHYDRAMINE HCL 25 MG PO CAPS: 25.0000 mg | ORAL_CAPSULE | Freq: Once | ORAL | Status: DC

## 2024-04-15 MED ORDER — ACETAMINOPHEN 325 MG PO TABS
650.0000 mg | ORAL_TABLET | Freq: Once | ORAL | Status: DC
Start: 2024-04-15 — End: 2024-04-15

## 2024-04-15 MED ORDER — SODIUM CHLORIDE 0.9% FLUSH
10.0000 mL | INTRAVENOUS | Status: DC | PRN
  Administered 2024-04-15: 10 mL

## 2024-04-15 MED ORDER — SODIUM CHLORIDE 0.9% FLUSH
10.0000 mL | INTRAVENOUS | Status: DC | PRN
  Administered 2024-04-15: 10 mL via INTRAVENOUS

## 2024-04-15 MED ORDER — SODIUM CHLORIDE 0.9 % IV SOLN: INTRAVENOUS | Status: DC

## 2024-04-15 MED ORDER — HEPARIN SOD (PORK) LOCK FLUSH 100 UNIT/ML IV SOLN
500.0000 [IU] | Freq: Once | INTRAVENOUS | Status: AC | PRN
  Administered 2024-04-15: 500 [IU]

## 2024-04-15 NOTE — Patient Instructions (Signed)
 [facility] [place] - A DEPT OF [facility]  Discharge Instructions: Thank you for choosing [facility] to provide your oncology and hematology care.  If you have a lab appointment with the Cancer Center - please note that after April 8th, 2024, all labs will be drawn in the cancer center.  You do not have to check in or register with the main entrance as you have in the past but will complete your check-in in the cancer center.  Wear comfortable clothing and clothing appropriate for easy access to any Portacath or PICC line.   We strive to give you quality time with your provider. You may need to reschedule your appointment if you arrive late (15 or more minutes).  Arriving late affects you and other patients whose appointments are after yours.  Also, if you miss three or more appointments without notifying the office, you may be dismissed from the clinic at the provider's discretion.      For prescription refill requests, have your pharmacy contact our office and allow 72 hours for refills to be completed.    Today you received the following chemotherapy and/or immunotherapy agents Kanjinti .  Trastuzumab  Injection What is this medication? TRASTUZUMAB  (tras TOO zoo mab) treats breast cancer and stomach cancer. It works by blocking a protein that causes cancer cells to grow and multiply. This helps to slow or stop the spread of cancer cells. This medicine may be used for other purposes; ask your health care provider or pharmacist if you have questions. COMMON BRAND NAME(S): Herceptin , HERCESSI, Herzuma , KANJINTI , Ogivri , Ontruzant , Trazimera  What should I tell my care team before I take this medication? They need to know if you have any of these conditions: Heart failure Lung disease An unusual or allergic reaction to trastuzumab , other medications, foods, dyes, or preservatives Pregnant or trying to get pregnant Breast-feeding How should I use this  medication? This medication is injected into a vein. It is given by your care team in a hospital or clinic setting. Talk to your care team about the use of this medication in children. It is not approved for use in children. Overdosage: If you think you have taken too much of this medicine contact a poison control center or emergency room at once. NOTE: This medicine is only for you. Do not share this medicine with others. What if I miss a dose? Keep appointments for follow-up doses. It is important not to miss your dose. Call your care team if you are unable to keep an appointment. What may interact with this medication? Certain types of chemotherapy, such as daunorubicin, doxorubicin, epirubicin, idarubicin This list may not describe all possible interactions. Give your health care provider a list of all the medicines, herbs, non-prescription drugs, or dietary supplements you use. Also tell them if you smoke, drink alcohol, or use illegal drugs. Some items may interact with your medicine. What should I watch for while using this medication? Your condition will be monitored carefully while you are receiving this medication. This medication may make you feel generally unwell. This is not uncommon, as chemotherapy affects healthy cells as well as cancer cells. Report any side effects. Continue your course of treatment even though you feel ill unless your care team tells you to stop. This medication may increase your risk of getting an infection. Call your care team for advice if you get a fever, chills, sore throat, or other symptoms of a cold or flu. Do  not treat yourself. Try to avoid being around people who are sick. Avoid taking medications that contain aspirin, acetaminophen , ibuprofen , naproxen , or ketoprofen unless instructed by your care team. These medications can hide a fever. Talk to your care team if you may be pregnant. Serious birth defects can occur if you take this medication during  pregnancy and for 7 months after the last dose. You will need a negative pregnancy test before starting this medication. Contraception is recommended while taking this medication and for 7 months after the last dose. Your care team can help you find the option that works for you. Do not breastfeed while taking this medication and for 7 months after stopping treatment. What side effects may I notice from receiving this medication? Side effects that you should report to your care team as soon as possible: Allergic reactions or angioedema--skin rash, itching or hives, swelling of the face, eyes, lips, tongue, arms, or legs, trouble swallowing or breathing Dry cough, shortness of breath or trouble breathing Heart failure--shortness of breath, swelling of the ankles, feet, or hands, sudden weight gain, unusual weakness or fatigue Infection--fever, chills, cough, or sore throat Infusion reactions--chest pain, shortness of breath or trouble breathing, feeling faint or lightheaded Side effects that usually do not require medical attention (report to your care team if they continue or are bothersome): Diarrhea Dizziness Headache Nausea Trouble sleeping Vomiting This list may not describe all possible side effects. Call your doctor for medical advice about side effects. You may report side effects to FDA at 1-800-FDA-1088. Where should I keep my medication? This medication is given in a hospital or clinic. It will not be stored at home. NOTE: This sheet is a summary. It may not cover all possible information. If you have questions about this medicine, talk to your doctor, pharmacist, or health care provider.  2024 Elsevier/Gold Standard (2022-01-23 00:00:00)       To help prevent nausea and vomiting after your treatment, we encourage you to take your nausea medication as directed.  BELOW ARE SYMPTOMS THAT SHOULD BE REPORTED IMMEDIATELY: *FEVER GREATER THAN 100.4 F (38 C) OR HIGHER *CHILLS OR  SWEATING *NAUSEA AND VOMITING THAT IS NOT CONTROLLED WITH YOUR NAUSEA MEDICATION *UNUSUAL SHORTNESS OF BREATH *UNUSUAL BRUISING OR BLEEDING *URINARY PROBLEMS (pain or burning when urinating, or frequent urination) *BOWEL PROBLEMS (unusual diarrhea, constipation, pain near the anus) TENDERNESS IN MOUTH AND THROAT WITH OR WITHOUT PRESENCE OF ULCERS (sore throat, sores in mouth, or a toothache) UNUSUAL RASH, SWELLING OR PAIN  UNUSUAL VAGINAL DISCHARGE OR ITCHING   Items with * indicate a potential emergency and should be followed up as soon as possible or go to the Emergency Department if any problems should occur.  Please show the CHEMOTHERAPY ALERT CARD or IMMUNOTHERAPY ALERT CARD at check-in to the Emergency Department and triage nurse.  Should you have questions after your visit or need to cancel or reschedule your appointment, please contact [facility] [place] - A DEPT OF [facility] [phone]  and follow the prompts.  Office hours are 8:00 a.m. to 4:30 p.m. Monday - Friday. Please note that voicemails left after 4:00 p.m. may not be returned until the following business day.  We are closed weekends and major holidays. You have access to a nurse at all times for urgent questions. Please call the main number to the clinic [phone] and follow the prompts.  For any non-urgent questions, you may also contact your provider using MyChart. We now  offer e-Visits for anyone 17 and older to request care online for non-urgent symptoms. For details visit [email or web address].   Also download the MyChart app! Go to the app store, search MyChart, open the app, select [facility], and log in with your MyChart username and password.

## 2024-04-15 NOTE — Progress Notes (Signed)
 Patient presents today for follow up appointment with Dr. Katragadda and Kanjinti  treatment. Vital signs within parameters for treatment. Treatment conditions do not need to wait on labs prior to infusion.   Kanjinti  given today per MD orders. Tolerated infusion without adverse affects. Vital signs stable. No complaints at this time. Discharged from clinic ambulatory in stable condition. Alert and oriented x 3. F/U with [facility] as scheduled.

## 2024-04-15 NOTE — Progress Notes (Signed)
 Patients port flushed without difficulty.  Good blood return noted with no bruising or swelling noted at site.  Patient remains accessed for treatment.

## 2024-04-15 NOTE — Progress Notes (Signed)
 Confirmed maintaining 6 mg/kg Kanjinti  dose per MD. Not reloading after holding last dose.

## 2024-04-15 NOTE — Patient Instructions (Addendum)
 [facility] at [facility] Discharge Instructions   You were seen and examined today by Dr. Rogers.  He reviewed the results of your lab work which are normal/stable.   He reviewed the results of your echocardiogram which shows your ejection fraction is 65-70%. This is very good.   We will proceed with your treatment today.   Return as scheduled.    Thank you for choosing [facility] at [facility] to provide your oncology and hematology care.  To afford each patient quality time with our provider, please arrive at least 15 minutes before your scheduled appointment time.   If you have a lab appointment with the Cancer Center please come in thru the Main Entrance and check in at the main information desk.  You need to re-schedule your appointment should you arrive 10 or more minutes late.  We strive to give you quality time with our providers, and arriving late affects you and other patients whose appointments are after yours.  Also, if you no show three or more times for appointments you may be dismissed from the clinic at the providers discretion.     Again, thank you for choosing [facility].  Our hope is that these requests will decrease the amount of time that you wait before being seen by our physicians.       _____________________________________________________________  Should you have questions after your visit to [facility], please contact our office at [phone] and follow the prompts.  Our office hours are 8:00 a.m. and 4:30 p.m. Monday - Friday.  Please note that voicemails left after 4:00 p.m. may not be returned until the following business day.  We are closed weekends and major holidays.  You do have access to a nurse 24-7, just call the main number to the clinic [phone] and do not press any options, hold on the line and a nurse will answer the phone.    For prescription refill requests, have  your pharmacy contact our office and allow 72 hours.    Due to Covid, you will need to wear a mask upon entering the hospital. If you do not have a mask, a mask will be given to you at the Main Entrance upon arrival. For doctor visits, patients may have 1 support person age 80 or older with them. For treatment visits, patients can not have anyone with them due to social distancing guidelines and our immunocompromised population.

## 2024-04-16 DIAGNOSIS — C50412 Malignant neoplasm of upper-outer quadrant of left female breast: Secondary | ICD-10-CM | POA: Diagnosis not present

## 2024-04-17 DIAGNOSIS — C50412 Malignant neoplasm of upper-outer quadrant of left female breast: Secondary | ICD-10-CM | POA: Diagnosis not present

## 2024-04-20 DIAGNOSIS — C50412 Malignant neoplasm of upper-outer quadrant of left female breast: Secondary | ICD-10-CM | POA: Diagnosis not present

## 2024-04-21 ENCOUNTER — Inpatient Hospital Stay: Admitting: Hematology

## 2024-04-21 ENCOUNTER — Inpatient Hospital Stay

## 2024-04-21 DIAGNOSIS — C50412 Malignant neoplasm of upper-outer quadrant of left female breast: Secondary | ICD-10-CM | POA: Diagnosis not present

## 2024-04-22 DIAGNOSIS — C50412 Malignant neoplasm of upper-outer quadrant of left female breast: Secondary | ICD-10-CM | POA: Diagnosis not present

## 2024-04-23 DIAGNOSIS — C50412 Malignant neoplasm of upper-outer quadrant of left female breast: Secondary | ICD-10-CM | POA: Diagnosis not present

## 2024-04-24 DIAGNOSIS — C50412 Malignant neoplasm of upper-outer quadrant of left female breast: Secondary | ICD-10-CM | POA: Diagnosis not present

## 2024-04-27 DIAGNOSIS — C50412 Malignant neoplasm of upper-outer quadrant of left female breast: Secondary | ICD-10-CM | POA: Diagnosis not present

## 2024-04-28 DIAGNOSIS — C50412 Malignant neoplasm of upper-outer quadrant of left female breast: Secondary | ICD-10-CM | POA: Diagnosis not present

## 2024-04-29 DIAGNOSIS — C50412 Malignant neoplasm of upper-outer quadrant of left female breast: Secondary | ICD-10-CM | POA: Diagnosis not present

## 2024-04-30 DIAGNOSIS — C50412 Malignant neoplasm of upper-outer quadrant of left female breast: Secondary | ICD-10-CM | POA: Diagnosis not present

## 2024-05-01 DIAGNOSIS — C50412 Malignant neoplasm of upper-outer quadrant of left female breast: Secondary | ICD-10-CM | POA: Diagnosis not present

## 2024-05-04 DIAGNOSIS — C50412 Malignant neoplasm of upper-outer quadrant of left female breast: Secondary | ICD-10-CM | POA: Diagnosis not present

## 2024-05-05 ENCOUNTER — Inpatient Hospital Stay

## 2024-05-05 ENCOUNTER — Inpatient Hospital Stay: Admitting: Oncology

## 2024-05-05 DIAGNOSIS — C50412 Malignant neoplasm of upper-outer quadrant of left female breast: Secondary | ICD-10-CM | POA: Diagnosis not present

## 2024-05-06 ENCOUNTER — Ambulatory Visit: Admitting: Oncology

## 2024-05-06 ENCOUNTER — Inpatient Hospital Stay

## 2024-05-06 ENCOUNTER — Inpatient Hospital Stay: Attending: Oncology

## 2024-05-06 VITALS — BP 130/76 | HR 66 | Temp 97.2°F | Resp 20

## 2024-05-06 DIAGNOSIS — Z1731 Human epidermal growth factor receptor 2 positive status: Secondary | ICD-10-CM | POA: Insufficient documentation

## 2024-05-06 DIAGNOSIS — Z170 Estrogen receptor positive status [ER+]: Secondary | ICD-10-CM

## 2024-05-06 DIAGNOSIS — Z8673 Personal history of transient ischemic attack (TIA), and cerebral infarction without residual deficits: Secondary | ICD-10-CM | POA: Diagnosis not present

## 2024-05-06 DIAGNOSIS — Z79899 Other long term (current) drug therapy: Secondary | ICD-10-CM | POA: Diagnosis not present

## 2024-05-06 DIAGNOSIS — Z79811 Long term (current) use of aromatase inhibitors: Secondary | ICD-10-CM | POA: Diagnosis not present

## 2024-05-06 DIAGNOSIS — M810 Age-related osteoporosis without current pathological fracture: Secondary | ICD-10-CM | POA: Insufficient documentation

## 2024-05-06 DIAGNOSIS — Z5112 Encounter for antineoplastic immunotherapy: Secondary | ICD-10-CM | POA: Diagnosis not present

## 2024-05-06 DIAGNOSIS — C50412 Malignant neoplasm of upper-outer quadrant of left female breast: Secondary | ICD-10-CM

## 2024-05-06 DIAGNOSIS — R0602 Shortness of breath: Secondary | ICD-10-CM | POA: Insufficient documentation

## 2024-05-06 DIAGNOSIS — Z923 Personal history of irradiation: Secondary | ICD-10-CM | POA: Insufficient documentation

## 2024-05-06 DIAGNOSIS — Z86718 Personal history of other venous thrombosis and embolism: Secondary | ICD-10-CM | POA: Diagnosis not present

## 2024-05-06 DIAGNOSIS — Z87891 Personal history of nicotine dependence: Secondary | ICD-10-CM | POA: Insufficient documentation

## 2024-05-06 DIAGNOSIS — R0609 Other forms of dyspnea: Secondary | ICD-10-CM | POA: Insufficient documentation

## 2024-05-06 LAB — COMPREHENSIVE METABOLIC PANEL WITH GFR
ALT: 9 U/L (ref 0–44)
AST: 22 U/L (ref 15–41)
Albumin: 3.8 g/dL (ref 3.5–5.0)
Alkaline Phosphatase: 67 U/L (ref 38–126)
Anion gap: 11 (ref 5–15)
BUN: 14 mg/dL (ref 8–23)
CO2: 23 mmol/L (ref 22–32)
Calcium: 8.6 mg/dL — ABNORMAL LOW (ref 8.9–10.3)
Chloride: 107 mmol/L (ref 98–111)
Creatinine, Ser: 0.44 mg/dL (ref 0.44–1.00)
GFR, Estimated: >60 mL/min (ref >60)
Glucose, Bld: 111 mg/dL — ABNORMAL HIGH (ref 70–99)
Potassium: 4 mmol/L (ref 3.5–5.1)
Sodium: 141 mmol/L (ref 135–145)
Total Bilirubin: 0.5 mg/dL (ref 0.0–1.2)
Total Protein: 6.6 g/dL (ref 6.5–8.1)

## 2024-05-06 LAB — CBC WITH DIFFERENTIAL/PLATELET
Abs Immature Granulocytes: 0.01 K/uL (ref 0.00–0.07)
Basophils Absolute: 0 K/uL (ref 0.0–0.1)
Basophils Relative: 1 %
Eosinophils Absolute: 0.1 K/uL (ref 0.0–0.5)
Eosinophils Relative: 1 %
HCT: 44.6 % (ref 36.0–46.0)
Hemoglobin: 14.4 g/dL (ref 12.0–15.0)
Immature Granulocytes: 0 %
Lymphocytes Relative: 19 %
Lymphs Abs: 1.2 K/uL (ref 0.7–4.0)
MCH: 32.4 pg (ref 26.0–34.0)
MCHC: 32.3 g/dL (ref 30.0–36.0)
MCV: 100.2 fL — ABNORMAL HIGH (ref 80.0–100.0)
Monocytes Absolute: 0.4 K/uL (ref 0.1–1.0)
Monocytes Relative: 7 %
Neutro Abs: 4.3 K/uL (ref 1.7–7.7)
Neutrophils Relative %: 72 %
Platelets: 193 K/uL (ref 150–400)
RBC: 4.45 MIL/uL (ref 3.87–5.11)
RDW: 13.7 % (ref 11.5–15.5)
WBC: 6 K/uL (ref 4.0–10.5)
nRBC: 0 % (ref 0.0–0.2)

## 2024-05-06 LAB — MAGNESIUM: Magnesium: 2.2 mg/dL (ref 1.7–2.4)

## 2024-05-06 MED ORDER — SODIUM CHLORIDE 0.9 % IV SOLN
INTRAVENOUS | Status: DC
Start: 2024-05-06 — End: 2024-05-06

## 2024-05-06 MED ORDER — DIPHENHYDRAMINE HCL 25 MG PO CAPS
25.0000 mg | ORAL_CAPSULE | Freq: Once | ORAL | Status: DC
  Filled 2024-05-06: qty 1

## 2024-05-06 MED ORDER — ACETAMINOPHEN 325 MG PO TABS
650.0000 mg | ORAL_TABLET | Freq: Once | ORAL | Status: DC
  Filled 2024-05-06: qty 2

## 2024-05-06 MED ORDER — TRASTUZUMAB-ANNS CHEMO 150 MG IV SOLR
6.0000 mg/kg | Freq: Once | INTRAVENOUS | Status: AC
  Administered 2024-05-06 (×2): 300 mg via INTRAVENOUS
  Filled 2024-05-06: qty 14.29

## 2024-05-06 NOTE — Assessment & Plan Note (Addendum)
-   2D echo from 01/20/2024 with LVEF 60 to 65%. - Reported recent worsening of dyspnea on exertion.  Repeat 2D echo on 04/10/2024 with LVEF 65 to 70%.  Reports shortness of breath has improved.   -Reports concern of her breathing for 2 weeks following her Herceptin  infusions. -We discussed in detail bring her back next week for follow-up given this is when symptoms tend to be present.  She completed radiation today.

## 2024-05-06 NOTE — Assessment & Plan Note (Addendum)
 Felt left breast lump in October. - Bilateral diagnostic mammogram/ultrasound (08/27/2023): 2.1 cm mass in the left breast 3 o'clock position.  No lymphadenopathy in the left axilla. 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  Ki-67 5%.  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 - Left breast lumpectomy and SLNB (09/26/2023) by Dr. Bridges - Pathology: 2.4 cm grade 2 IDC, margins negative, negative LVI/PNI.  0/3 lymph nodes involved with cancer.  pT2 pN0. - 6 cycles of docetaxel , carboplatin  and Herceptin  (T[facility]) from 11/04/2023 through 02/18/2024, with Herceptin  ongoing - Anastrozole  started on 03/31/2024 - XRT to the left breast and chest wall to start during the week of 04/06/2024 - She has completed 20 treatments of radiation therapy. - Herceptin  was held last visit due to worsening dyspnea on exertion.  She reports that her breathing is better at this time.  Reports 2 weeks following Herceptin  her breathing is poor but the last week her breathing is back to normal. - I reviewed labs today: Normal CBC. - Chest x-ray from 03/31/2024 with no acute findings.  2D echocardiogram was normal. - Today, her breathing is much better.  I recommend proceeding with Herceptin  today but bringing her back next week to check her oxygen  saturations with ambulation and the need for additional imaging.

## 2024-05-06 NOTE — Progress Notes (Signed)
 Patient okay for Kajinti today per Randall Hope NP. Patient tolerated therapy with no complaints voiced. Side effects with management reviewed with understanding verbalized. Port site clean and dry with no bruising or swelling noted at site. Good blood return noted before and after administration of therapy. Band aid applied. Patient left in satisfactory condition with VSS and no s/s of distress noted.

## 2024-05-06 NOTE — Progress Notes (Signed)
 [facility] [address], [place] [address]    Clinic Day:  05/06/2024  Referring physician: Cook, Jayce G, DO  Patient Care Team: Cook, Jayce G, DO as PCP - General (Family Medicine) Blinda Ferry, MD as Consulting Physician (Psychiatry) Joshua Pao, NP as Nurse Practitioner (Psychiatry) Celestia Joesph SQUIBB, RN as Oncology Nurse Navigator (Medical Oncology)   ASSESSMENT & PLAN:   Assessment & Plan Malignant neoplasm of upper-outer quadrant of left breast in female, estrogen receptor positive (HCC)  Felt left breast lump in October. - Bilateral diagnostic mammogram/ultrasound (08/27/2023): 2.1 cm mass in the left breast 3 o'clock position.  No lymphadenopathy in the left axilla. 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  Ki-67 5%.  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 - Left breast lumpectomy and SLNB (09/26/2023) by Dr. Bridges - Pathology: 2.4 cm grade 2 IDC, margins negative, negative LVI/PNI.  0/3 lymph nodes involved with cancer.  pT2 pN0. - 6 cycles of docetaxel , carboplatin  and Herceptin  (TCH) from 11/04/2023 through 02/18/2024, with Herceptin  ongoing - Anastrozole  started on 03/31/2024 - XRT to the left breast and chest wall to start during the week of 04/06/2024 - She has completed 20 treatments of radiation therapy. - Herceptin  was held last visit due to worsening dyspnea on exertion.  She reports that her breathing is better at this time.  Reports 2 weeks following Herceptin  her breathing is poor but the last week her breathing is back to normal. - I reviewed labs today: Normal CBC. - Chest x-ray from 03/31/2024 with no acute findings.  2D echocardiogram was normal. - Today, her breathing is much better.  I recommend proceeding with Herceptin  today but bringing her back next week to check her oxygen  saturations with ambulation and the need for additional imaging.  Osteoporosis, unspecified osteoporosis  type, unspecified pathological fracture presence - Continue Reclast  once a year.  Last dose was on 10/16/2023.  Will check vitamin D  level prior to next visit. History of stroke - She is not on any anticoagulation at this time.  Plan is for antiphospholipid syndrome workup prior to next visit.   SOB (shortness of breath) - 2D echo from 01/20/2024 with LVEF 60 to 65%. - Reported recent worsening of dyspnea on exertion.  Repeat 2D echo on 04/10/2024 with LVEF 65 to 70%.  Reports shortness of breath has improved.   -Reports concern of her breathing for 2 weeks following her Herceptin  infusions. -We discussed in detail bring her back next week for follow-up given this is when symptoms tend to be present.  She completed radiation today.     PLAN SUMMARY: >> Discussed proceeding with Herceptin  today. >> Recommend follow-up next week to assess her breathing and the need for additional imaging. >> Keep all scheduled appointments.       No orders of the defined types were placed in this encounter. Pamela Henson Geofm, NP   8/13/20254:24 PM  CHIEF COMPLAINT:   Diagnosis:  left breast cancer, ER+/HER2+    Cancer Staging  Breast cancer of upper-outer quadrant of left female breast [facility]) Staging form: Breast, AJCC 8th Edition - Clinical stage from 09/03/2023: cT2, cN0, cM0, G2, PR-, HER2+ - Unsigned    Prior Therapy: left lumpectomy, 09/26/23, 6 cycles of TCH  Current Therapy: Maintenance Herceptin , anastrozole  and XRT   HISTORY OF PRESENT ILLNESS:   Oncology History  Breast cancer of upper-outer  quadrant of left female breast (HCC)  09/03/2023 Initial Diagnosis   Breast cancer of upper-outer quadrant of left female breast (HCC)   11/04/2023 -  Chemotherapy   Patient is on Treatment Plan : BREAST Docetaxel  + Carboplatin  + Trastuzumab  (TCH) q21d / Trastuzumab  q21d        INTERVAL HISTORY:   Pamela Henson is a 73 y.o. female presenting to clinic today for follow up of left breast  cancer, ER+/HER2+.   Today, she states that she is doing well overall. Her appetite level is at 80%. Her energy level is at 70%.   Reports she completed XRT today.  States over the past 2 treatments she has developed shortness of breath following Herceptin  at least 4 the first 2 weeks.  The last week she is completely back to normal.  Reports if she sits perfectly still, her breathing is fine but with any sort of movement, she is severely short of breath.  Reports she is sometimes gasping for air.  She does not think that this is normal.   She did have an echocardiogram and a chest x-ray which both appeared to be WNL.  Her radiation oncologist in [place] recommended a CT angio by Dr. Rogers did not feel that this was needed.  Otherwise, she is tolerating treatments well.  PAST MEDICAL HISTORY:   Past Medical History: Past Medical History:  Diagnosis Date   Anemia    Bipolar 1 disorder (HCC)    Bipolar 1 disorder (HCC)    Complication of anesthesia    Headache    History of chicken pox    History of measles    History of mumps    PONV (postoperative nausea and vomiting)    Stroke (HCC) 1975   Urine retention    takes Flomax     Surgical History: Past Surgical History:  Procedure Laterality Date   ABDOMINAL HYSTERECTOMY  09/24/1977   age 48 due to vaginal bleeding   BREAST BIOPSY Left 08/30/2023   US  LT BREAST BX W LOC DEV 1ST LESION IMG BX SPEC US  GUIDE 08/30/2023 [facility]MO[facility]   BREAST LUMPECTOMY Left 09/26/2023   COLONOSCOPY N/A 10/30/2017   Procedure: COLONOSCOPY;  Surgeon: Golda Claudis PENNER, MD;  Location: [facility];  Service: Endoscopy;  Laterality: N/A;  1225   COLONOSCOPY WITH PROPOFOL  N/A 10/13/2022   Procedure: COLONOSCOPY WITH PROPOFOL ;  Surgeon: Eartha Angelia Sieving, MD;  Location: [facility];  Service: Gastroenterology;  Laterality: N/A;   ESOPHAGOGASTRODUODENOSCOPY (EGD) WITH PROPOFOL  N/A 10/13/2022   Procedure: ESOPHAGOGASTRODUODENOSCOPY (EGD)  WITH PROPOFOL ;  Surgeon: Eartha Angelia Sieving, MD;  Location: [facility];  Service: Gastroenterology;  Laterality: N/A;   ORIF TIBIA PLATEAU Right 12/12/2022   Procedure: OPEN REDUCTION INTERNAL FIXATION (ORIF) TIBIAL PLATEAU;  Surgeon: Kendal Franky SQUIBB, MD;  Location: [facility] OR;  Service: Orthopedics;  Laterality: Right;   PARTIAL MASTECTOMY WITH AXILLARY SENTINEL LYMPH NODE BIOPSY Left 09/26/2023   Procedure: PARTIAL MASTECTOMY WITH AXILLARY SENTINEL LYMPH NODE BIOPSY;  Surgeon: Kallie Manuelita BROCKS, MD;  Location: [facility];  Service: General;  Laterality: Left;   PERIPHERAL VASCULAR THROMBECTOMY Right 11/2022   2 DVT following surgical intervention of fracture   POLYPECTOMY  10/13/2022   Procedure: POLYPECTOMY;  Surgeon: Eartha Angelia Sieving, MD;  Location: [facility];  Service: Gastroenterology;;   PORTACATH PLACEMENT Right 11/01/2023   Procedure: INSERTION PORT-A-CATH;  Surgeon: Kallie Manuelita BROCKS, MD;  Location: [facility];  Service: General;  Laterality: Right;    Social History: Social History  Socioeconomic History   Marital status: Widowed    Spouse name: Not on file   Number of children: 1   Years of education: Not on file   Highest education level: Some college, no degree  Occupational History   Occupation: Disablity    Comment: Due to back problems  Tobacco Use   Smoking status: Former    Current packs/day: 0.00    Average packs/day: 1 pack/day for 15.0 years (15.0 ttl pk-yrs)    Types: Cigarettes    Start date: 09/25/1983    Quit date: 09/24/1998    Years since quitting: 25.6   Smokeless tobacco: Never  Vaping Use   Vaping status: Never Used  Substance and Sexual Activity   Alcohol use: No    Alcohol/week: 0.0 standard drinks of alcohol   Drug use: No   Sexual activity: Not on file  Other Topics Concern   Not on file  Social History Narrative   Not on file   Social Drivers of Health   Financial Resource Strain: Low Risk  (08/16/2023)   Overall Financial  Resource Strain (CARDIA)    Difficulty of Paying Living Expenses: Not hard at all  Food Insecurity: No Food Insecurity (03/04/2024)   Received from [facility]   Hunger Vital Sign    Within the past 12 months, you worried that your food would run out before you got the money to buy more.: Never true    Within the past 12 months, the food you bought just didn't last and you didn't have money to get more.: Never true  Transportation Needs: No Transportation Needs (03/04/2024)   Received from [facility] - Transportation    In the past 12 months, has lack of transportation kept you from medical appointments or from getting medications?: No    In the past 12 months, has lack of transportation kept you from meetings, work, or from getting things needed for daily living?: No  Physical Activity: Inactive (08/16/2023)   Exercise Vital Sign    Days of Exercise per Week: 0 days    Minutes of Exercise per Session: 30 min  Stress: Stress Concern Present (08/16/2023)   [employer] of Occupational Health - Occupational Stress Questionnaire    Feeling of Stress : To some extent  Social Connections: Moderately Integrated (08/16/2023)   Social Connection and Isolation Panel    Frequency of Communication with Friends and Family: More than three times a week    Frequency of Social Gatherings with Friends and Family: More than three times a week    Attends Religious Services: 1 to 4 times per year    Active Member of [employer] or Organizations: No    Attends Engineer, structural: More than 4 times per year    Marital Status: Widowed  Intimate Partner Violence: Not At Risk (05/10/2023)   Humiliation, Afraid, Rape, and Kick questionnaire    Fear of Current or Ex-Partner: No    Emotionally Abused: No    Physically Abused: No    Sexually Abused: No    Family History: Family History  Problem Relation Age of Onset   Stroke Mother 53   Stroke Father 57   Colon cancer Neg Hx      Current Medications:  Current Outpatient Medications:    acetaminophen  (TYLENOL ) 500 MG tablet, Take 500 mg by mouth every 6 (six) hours as needed for mild pain, moderate pain or headache., Disp: , Rfl:    anastrozole  (  ARIMIDEX ) 1 MG tablet, Take 1 tablet (1 mg total) by mouth daily., Disp: 30 tablet, Rfl: 5   calcium -vitamin D  (OSCAL WITH D) 500-5 MG-MCG tablet, Take 1 tablet by mouth 2 (two) times daily with a meal., Disp: 180 tablet, Rfl: 1   CARBOPLATIN  IV, Inject into the vein., Disp: , Rfl:    Cholecalciferol  (VITAMIN D ) 50 MCG (2000 UT) tablet, Take 2,000 Units by mouth daily., Disp: , Rfl:    DOCEtaxel  (TAXOTERE  IV), Inject into the vein., Disp: , Rfl:    gabapentin  (NEURONTIN ) 100 MG capsule, Take 100 mg by mouth 3 (three) times daily., Disp: , Rfl:    lidocaine -prilocaine  (EMLA ) cream, Apply to affected area once, Disp: 30 g, Rfl: 3   melatonin 5 MG TABS, Take 5 mg by mouth at bedtime as needed (sleep)., Disp: , Rfl:    Melatonin-Pyridoxine 5-1 MG TABS, Take 5 mg by mouth., Disp: , Rfl:    Multiple Vitamins-Minerals (MULTIVITAMIN WITH MINERALS) tablet, Take 1 tablet by mouth daily., Disp: , Rfl:    prochlorperazine  (COMPAZINE ) 10 MG tablet, Take 1 tablet (10 mg total) by mouth every 6 (six) hours as needed for nausea or vomiting., Disp: 60 tablet, Rfl: 3   rosuvastatin  (CRESTOR ) 5 MG tablet, Take 1 tablet (5 mg total) by mouth daily., Disp: 90 tablet, Rfl: 3   sertraline  (ZOLOFT ) 50 MG tablet, Take 50 mg by mouth at bedtime., Disp: , Rfl:    tamsulosin  (FLOMAX ) 0.4 MG CAPS capsule, Take 1 capsule (0.4 mg total) by mouth at bedtime., Disp: 90 capsule, Rfl: 3   traMADol  (ULTRAM ) 50 MG tablet, Take 1 tablet (50 mg total) by mouth every 6 (six) hours as needed for severe pain (pain score 7-10)., Disp: 5 tablet, Rfl: 0   Trastuzumab -anns (KANJINTI  IV), Inject into the vein., Disp: , Rfl:  No current facility-administered medications for this visit.  Facility-Administered Medications  Ordered in Other Visits:    0.9 %  sodium chloride  infusion, , Intravenous, Continuous, Kandala, Hyndavi, MD, Stopped at 05/06/24 1450   acetaminophen  (TYLENOL ) tablet 650 mg, 650 mg, Oral, Once, Kandala, Hyndavi, MD   diphenhydrAMINE  (BENADRYL ) capsule 25 mg, 25 mg, Oral, Once, Kandala, Hyndavi, MD   Allergies: Allergies  Allergen Reactions   Asa [Aspirin] Other (See Comments)    Had GI bleed 09/2022   Eliquis [Apixaban]     Severe headaches    Oxycodone      Agitation, caused bipolar episode     REVIEW OF SYSTEMS:   Review of Systems  Constitutional:  Positive for fatigue.  Respiratory:  Positive for shortness of breath.   Psychiatric/Behavioral:  Positive for depression. The patient is nervous/anxious.      VITALS:   There were no vitals taken for this visit.  Wt Readings from Last 3 Encounters:  05/06/24 107 lb 9.4 oz (48.8 kg)  04/15/24 109 lb 9.6 oz (49.7 kg)  03/31/24 111 lb 3.2 oz (50.4 kg)    There is no height or weight on file to calculate BMI.  Performance status (ECOG): 1 - Symptomatic but completely ambulatory  PHYSICAL EXAM:   Physical Exam Constitutional:      Appearance: Normal appearance.  Cardiovascular:     Rate and Rhythm: Normal rate and regular rhythm.  Pulmonary:     Effort: Pulmonary effort is normal.     Breath sounds: Examination of the right-upper field reveals decreased breath sounds. Examination of the left-upper field reveals decreased breath sounds. Decreased breath sounds present. No wheezing or  rhonchi.  Abdominal:     General: Bowel sounds are normal.     Palpations: Abdomen is soft.  Musculoskeletal:        General: No swelling. Normal range of motion.  Neurological:     Mental Status: She is alert and oriented to person, place, and time. Mental status is at baseline.     LABS:   CBC     Component Value Date/Time   WBC 6.0 05/06/2024 1239   RBC 4.45 05/06/2024 1239   HGB 14.4 05/06/2024 1239   HGB 15.0 08/20/2023 1122    HCT 44.6 05/06/2024 1239   HCT 46.4 08/20/2023 1122   PLT 193 05/06/2024 1239   PLT 228 08/20/2023 1122   MCV 100.2 (H) 05/06/2024 1239   MCV 95 08/20/2023 1122   MCH 32.4 05/06/2024 1239   MCHC 32.3 05/06/2024 1239   RDW 13.7 05/06/2024 1239   RDW 12.6 08/20/2023 1122   LYMPHSABS 1.2 05/06/2024 1239   MONOABS 0.4 05/06/2024 1239   EOSABS 0.1 05/06/2024 1239   BASOSABS 0.0 05/06/2024 1239    CMP      Component Value Date/Time   NA 138 04/15/2024 1300   NA 147 (H) 08/20/2023 1122   K 4.4 04/15/2024 1300   CL 103 04/15/2024 1300   CO2 23 04/15/2024 1300   GLUCOSE 95 04/15/2024 1300   BUN 11 04/15/2024 1300   BUN 12 08/20/2023 1122   CREATININE 0.57 04/15/2024 1300   CALCIUM  8.8 (L) 04/15/2024 1300   PROT 6.6 04/15/2024 1300   PROT 6.5 08/20/2023 1122   ALBUMIN 3.8 04/15/2024 1300   ALBUMIN 4.1 08/20/2023 1122   AST 21 04/15/2024 1300   ALT 9 04/15/2024 1300   ALKPHOS 72 04/15/2024 1300   BILITOT 0.7 04/15/2024 1300   BILITOT <0.2 08/20/2023 1122   GFRNONAA >60 04/15/2024 1300   GFRAA >60 05/29/2016 1145     No results found for: CEA1, CEA / No results found for: CEA1, CEA No results found for: PSA1 No results found for: CAN199 No results found for: CAN125  No results found for: TOTALPROTELP, ALBUMINELP, A1GS, A2GS, BETS, BETA2SER, GAMS, MSPIKE, SPEI No results found for: TIBC, FERRITIN, IRONPCTSAT No results found for: LDH   STUDIES:   ECHOCARDIOGRAM COMPLETE Result Date: 04/10/2024    ECHOCARDIOGRAM REPORT   Patient Name:   LASHANTI CHAMBLESS [facility] Date of Exam: 04/10/2024 Medical Rec #:  [id number]        Height:       63.0 in Accession #:    [id number]       Weight:       111.2 lb Date of Birth:  December 20, 1950         BSA:          1.507 m Patient Age:    73 years         BP:           128/74 mmHg Patient Gender: F                HR:           59 bpm. Exam Location:  [facility] Procedure: 2D Echo, Cardiac Doppler, Color Doppler and  Strain Analysis (Both            Spectral and Color Flow Doppler were utilized during procedure). Indications:    Chemo Z09  History:        Patient has prior history of Echocardiogram examinations, most  recent 01/20/2024. Stroke; Risk Factors:Former Smoker and                 Dyslipidemia. Breast cancer of upper-outer quadrant of left                 female breast (HCC).  Sonographer:    Aida Pizza RCS Referring Phys: [id number] [facility]  Sonographer Comments: Global longitudinal strain was attempted. IMPRESSIONS  1. Left ventricular ejection fraction, by estimation, is 65 to 70%. The left ventricle has normal function. The left ventricle has no regional wall motion abnormalities. There is mild left ventricular hypertrophy. Left ventricular diastolic parameters were normal.  2. Right ventricular systolic function is normal. The right ventricular size is normal. Tricuspid regurgitation signal is inadequate for assessing PA pressure.  3. Left atrial size was mildly dilated.  4. The mitral valve is abnormal. Mild mitral valve regurgitation. No evidence of mitral stenosis.  5. The aortic valve is tricuspid. Aortic valve regurgitation is not visualized. No aortic stenosis is present.  6. The inferior vena cava is dilated in size with >50% respiratory variability, suggesting right atrial pressure of 8 mmHg. FINDINGS  Left Ventricle: Left ventricular ejection fraction, by estimation, is 65 to 70%. The left ventricle has normal function. The left ventricle has no regional wall motion abnormalities. Strain was performed and the global longitudinal strain is indeterminate. The left ventricular internal cavity size was normal in size. There is mild left ventricular hypertrophy. Left ventricular diastolic parameters were normal. Right Ventricle: The right ventricular size is normal. Right vetricular wall thickness was not well visualized. Right ventricular systolic function is normal. Tricuspid  regurgitation signal is inadequate for assessing PA pressure. Left Atrium: Left atrial size was mildly dilated. Right Atrium: Right atrial size was normal in size. Pericardium: There is no evidence of pericardial effusion. Mitral Valve: The mitral valve is abnormal. There is mild thickening of the mitral valve leaflet(s). There is mild calcification of the mitral valve leaflet(s). Mild mitral annular calcification. Mild mitral valve regurgitation. No evidence of mitral valve stenosis. Tricuspid Valve: The tricuspid valve is normal in structure. Tricuspid valve regurgitation is not demonstrated. No evidence of tricuspid stenosis. Aortic Valve: The aortic valve is tricuspid. Aortic valve regurgitation is not visualized. No aortic stenosis is present. Aortic valve mean gradient measures 2.6 mmHg. Aortic valve peak gradient measures 6.8 mmHg. Aortic valve area, by VTI measures 2.49 cm. Pulmonic Valve: The pulmonic valve was not well visualized. Pulmonic valve regurgitation is trivial. No evidence of pulmonic stenosis. Aorta: The aortic root and ascending aorta are structurally normal, with no evidence of dilitation. Venous: The inferior vena cava is dilated in size with greater than 50% respiratory variability, suggesting right atrial pressure of 8 mmHg. IAS/Shunts: No atrial level shunt detected by color flow Doppler.  LEFT VENTRICLE PLAX 2D LVIDd:         4.80 cm   Diastology LVIDs:         2.70 cm   LV e' medial:    5.87 cm/s LV PW:         1.20 cm   LV E/e' medial:  13.4 LV IVS:        1.20 cm   LV e' lateral:   8.27 cm/s LVOT diam:     2.00 cm   LV E/e' lateral: 9.5 LV SV:         52 LV SV Index:   35 LVOT Area:     3.14 cm  RIGHT  VENTRICLE RV S prime:     14.40 cm/s TAPSE (M-mode): 2.1 cm LEFT ATRIUM             Index        RIGHT ATRIUM           Index LA diam:        3.60 cm 2.39 cm/m   RA Area:     13.30 cm LA Vol (A2C):   58.6 ml 38.89 ml/m  RA Volume:   30.30 ml  20.11 ml/m LA Vol (A4C):   54.7 ml 36.30  ml/m LA Biplane Vol: 57.0 ml 37.83 ml/m  AORTIC VALVE AV Area (Vmax):    2.25 cm AV Area (Vmean):   2.27 cm AV Area (VTI):     2.49 cm AV Vmax:           129.94 cm/s AV Vmean:          73.341 cm/s AV VTI:            0.209 m AV Peak Grad:      6.8 mmHg AV Mean Grad:      2.6 mmHg LVOT Vmax:         93.00 cm/s LVOT Vmean:        53.000 cm/s LVOT VTI:          0.166 m LVOT/AV VTI ratio: 0.79  AORTA Ao Root diam: 3.40 cm Ao Asc diam:  3.10 cm MITRAL VALVE MV Area (PHT): 4.31 cm    SHUNTS MV Decel Time: 176 msec    Systemic VTI:  0.17 m MV E velocity: 78.50 cm/s  Systemic Diam: 2.00 cm MV A velocity: 68.60 cm/s MV E/A ratio:  1.14 Dorn Ross MD Electronically signed by Dorn Ross MD Signature Date/Time: 04/10/2024/1:05:55 PM    Final

## 2024-05-06 NOTE — Assessment & Plan Note (Addendum)
-   She is not on any anticoagulation at this time.  Plan is for antiphospholipid syndrome workup prior to next visit.

## 2024-05-06 NOTE — Assessment & Plan Note (Addendum)
-   Continue Reclast  once a year.  Last dose was on 10/16/2023.  Will check vitamin D  level prior to next visit.

## 2024-05-06 NOTE — Patient Instructions (Signed)
 [facility] [place] - A DEPT OF [facility]  Discharge Instructions: Thank you for choosing [facility] to provide your oncology and hematology care.  If you have a lab appointment with the Cancer Center - please note that after April 8th, 2024, all labs will be drawn in the cancer center.  You do not have to check in or register with the main entrance as you have in the past but will complete your check-in in the cancer center.  Wear comfortable clothing and clothing appropriate for easy access to any Portacath or PICC line.   We strive to give you quality time with your provider. You may need to reschedule your appointment if you arrive late (15 or more minutes).  Arriving late affects you and other patients whose appointments are after yours.  Also, if you miss three or more appointments without notifying the office, you may be dismissed from the clinic at the provider's discretion.      For prescription refill requests, have your pharmacy contact our office and allow 72 hours for refills to be completed.    Today you received the following chemotherapy and/or immunotherapy agents Kanjinti, return as scheduled.   To help prevent nausea and vomiting after your treatment, we encourage you to take your nausea medication as directed.  BELOW ARE SYMPTOMS THAT SHOULD BE REPORTED IMMEDIATELY: *FEVER GREATER THAN 100.4 F (38 C) OR HIGHER *CHILLS OR SWEATING *NAUSEA AND VOMITING THAT IS NOT CONTROLLED WITH YOUR NAUSEA MEDICATION *UNUSUAL SHORTNESS OF BREATH *UNUSUAL BRUISING OR BLEEDING *URINARY PROBLEMS (pain or burning when urinating, or frequent urination) *BOWEL PROBLEMS (unusual diarrhea, constipation, pain near the anus) TENDERNESS IN MOUTH AND THROAT WITH OR WITHOUT PRESENCE OF ULCERS (sore throat, sores in mouth, or a toothache) UNUSUAL RASH, SWELLING OR PAIN  UNUSUAL VAGINAL DISCHARGE OR ITCHING   Items with * indicate a potential emergency and  should be followed up as soon as possible or go to the Emergency Department if any problems should occur.  Please show the CHEMOTHERAPY ALERT CARD or IMMUNOTHERAPY ALERT CARD at check-in to the Emergency Department and triage nurse.  Should you have questions after your visit or need to cancel or reschedule your appointment, please contact [facility] [place] - A DEPT OF [facility] [phone]  and follow the prompts.  Office hours are 8:00 a.m. to 4:30 p.m. Monday - Friday. Please note that voicemails left after 4:00 p.m. may not be returned until the following business day.  We are closed weekends and major holidays. You have access to a nurse at all times for urgent questions. Please call the main number to the clinic [phone] and follow the prompts.  For any non-urgent questions, you may also contact your provider using MyChart. We now offer e-Visits for anyone 91 and older to request care online for non-urgent symptoms. For details visit [email or web address].   Also download the MyChart app! Go to the app store, search "MyChart", open the app, select [place], and log in with your MyChart username and password.

## 2024-05-12 ENCOUNTER — Ambulatory Visit

## 2024-05-13 ENCOUNTER — Inpatient Hospital Stay (HOSPITAL_BASED_OUTPATIENT_CLINIC_OR_DEPARTMENT_OTHER): Admitting: Physician Assistant

## 2024-05-13 ENCOUNTER — Ambulatory Visit: Payer: Self-pay | Admitting: Physician Assistant

## 2024-05-13 ENCOUNTER — Ambulatory Visit (HOSPITAL_COMMUNITY)
Admission: RE | Admit: 2024-05-13 | Discharge: 2024-05-13 | Disposition: A | Discharge status: Home / Self Care | Source: Ambulatory Visit | Attending: Physician Assistant | Admitting: Physician Assistant

## 2024-05-13 DIAGNOSIS — R0602 Shortness of breath: Secondary | ICD-10-CM | POA: Diagnosis not present

## 2024-05-13 DIAGNOSIS — I2699 Other pulmonary embolism without acute cor pulmonale: Secondary | ICD-10-CM

## 2024-05-13 DIAGNOSIS — Z79811 Long term (current) use of aromatase inhibitors: Secondary | ICD-10-CM | POA: Diagnosis not present

## 2024-05-13 DIAGNOSIS — R0600 Dyspnea, unspecified: Secondary | ICD-10-CM

## 2024-05-13 DIAGNOSIS — C50412 Malignant neoplasm of upper-outer quadrant of left female breast: Secondary | ICD-10-CM | POA: Diagnosis not present

## 2024-05-13 DIAGNOSIS — Z86718 Personal history of other venous thrombosis and embolism: Secondary | ICD-10-CM | POA: Diagnosis not present

## 2024-05-13 DIAGNOSIS — R0902 Hypoxemia: Secondary | ICD-10-CM | POA: Diagnosis not present

## 2024-05-13 DIAGNOSIS — Z87891 Personal history of nicotine dependence: Secondary | ICD-10-CM | POA: Diagnosis not present

## 2024-05-13 DIAGNOSIS — Z170 Estrogen receptor positive status [ER+]: Secondary | ICD-10-CM

## 2024-05-13 DIAGNOSIS — J439 Emphysema, unspecified: Secondary | ICD-10-CM | POA: Diagnosis not present

## 2024-05-13 DIAGNOSIS — Z5112 Encounter for antineoplastic immunotherapy: Secondary | ICD-10-CM | POA: Diagnosis not present

## 2024-05-13 DIAGNOSIS — Z923 Personal history of irradiation: Secondary | ICD-10-CM | POA: Diagnosis not present

## 2024-05-13 DIAGNOSIS — M810 Age-related osteoporosis without current pathological fracture: Secondary | ICD-10-CM | POA: Diagnosis not present

## 2024-05-13 DIAGNOSIS — Z8673 Personal history of transient ischemic attack (TIA), and cerebral infarction without residual deficits: Secondary | ICD-10-CM | POA: Diagnosis not present

## 2024-05-13 DIAGNOSIS — R0609 Other forms of dyspnea: Secondary | ICD-10-CM | POA: Diagnosis not present

## 2024-05-13 DIAGNOSIS — Z1731 Human epidermal growth factor receptor 2 positive status: Secondary | ICD-10-CM | POA: Diagnosis not present

## 2024-05-13 DIAGNOSIS — Z79899 Other long term (current) drug therapy: Secondary | ICD-10-CM | POA: Diagnosis not present

## 2024-05-13 MED ORDER — RIVAROXABAN (XARELTO) VTE STARTER PACK (15 & 20 MG): ORAL_TABLET | ORAL | 0 refills | Status: DC

## 2024-05-13 MED ORDER — IOHEXOL 350 MG/ML SOLN
75.0000 mL | Freq: Once | INTRAVENOUS | Status: AC | PRN
  Administered 2024-05-13: 75 mL via INTRAVENOUS

## 2024-05-13 NOTE — Patient Instructions (Signed)
 Pamela Henson at [facility] **VISIT SUMMARY & IMPORTANT INSTRUCTIONS **   You were seen today by Pleasant Barefoot PA-C for your symptom management visit.   Your shortness of breath may be a side effect of your cancer treatment (trastuzumab  infusions). Will check a CT scan of your chest to make sure you do not have any lung damage or blood clots in your lungs. We will set you up for home oxygen  to be used as needed.  You will receive a call from the oxygen  suppliers.    FOLLOW-UP APPOINTMENT: Treatment as scheduled on 05/27/2024. **Please contact us  sooner if you have any new or worsening symptoms, questions, or concerns!  ** Thank you for trusting me with your healthcare!  I strive to provide all of my patients with quality care at each visit.  If you receive a survey for this visit, I would be so grateful to you for taking the time to provide feedback.  Thank you in advance!  ~ Letia Guidry                                        Dr. Mickiel Davonna Pleasant Barefoot, PA-C          Delon Hope, NP   - - - - - - - - - - - - - - - - - -    Thank you for choosing [facility] at [facility] to provide your oncology and hematology care.  To afford each patient quality time with our provider, please arrive at least 15 minutes before your scheduled appointment time.   If you have a lab appointment with the [facility] please come in thru the Main Entrance and check in at the main information desk.  You need to re-schedule your appointment should you arrive 10 or more minutes late.  We strive to give you quality time with our providers, and arriving late affects you and other patients whose appointments are after yours.  Also, if you no show three or more times for appointments you may be dismissed from the clinic at the providers discretion.     Again, thank you for choosing [facility].  Our hope is that these requests will decrease the  amount of time that you wait before being seen by our physicians.       _____________________________________________________________  Should you have questions after your visit to [facility], please contact our office at [phone] and follow the prompts.  Our office hours are 8:00 a.m. and 4:30 p.m. Monday - Friday.  Please note that voicemails left after 4:00 p.m. may not be returned until the following business day.  We are closed weekends and major holidays.  You do have access to a nurse 24-7, just call the main number to the clinic [phone] and do not press any options, hold on the line and a nurse will answer the phone.    For prescription refill requests, have your pharmacy contact our office and allow 72 hours.

## 2024-05-13 NOTE — Telephone Encounter (Signed)
 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  There was no definite evidence of pulmonary arterial thromboembolism.  Main pulmonary artery was mildly dilated, measuring 3 cm in diameter.  There was moderate centrilobular emphysema and mucous plugging within the right lower lobe bronchi.  Discussed with supervising MD (Dr. Davonna). We will initiate anticoagulation therapy.  Called to discuss results and treatment plan with patient via telephone.  Rx for Xarelto  starter pack sent to pharmacy.  (Patient previously had severe headaches while on Eliquis, does not want to be placed back on apixaban.) Discussed bleeding precautions with patient.  She has a history of GI bleed, and was educated on red flag symptoms that would prompt immediate medical attention. Discussed potential of pulmonary venous thrombus causing stroke or other arterial thromboembolism.  Patient educated on red flag symptoms that prompt immediate medical attention. Question that patient may have underlying hypercoagulable state, as she also has a history of DVT following leg surgery in January 2024.  Patient will follow-up as scheduled with medical oncologist. She will call sooner if she has any new symptoms, questions, or concerns.  Pleasant CHRISTELLA Barefoot, PA-C 05/13/24 4:54 PM

## 2024-05-13 NOTE — Progress Notes (Signed)
 [facility] [address] [place] [address] Phone: [phone] Fax: [phone]  SYMPTOM MANAGEMENT CLINIC PROGRESS NOTE   Pamela Henson [facility] [id number] Aug 12, 1951 73 y.o.  Pamela Henson is managed by Dr. Rogers (transitioning care to Dr. Davonna) for her left breast cancer.  Actively treated with chemotherapy/immunotherapy/hormonal therapy: YES  Current therapy: Trastuzumab   Last treated: 05/06/2024  INTERVAL HISTORY:  Chief Complaint: Shortness of breath  Pamela Henson presents for evaluation of dyspnea 1 week after administration of trastuzumab .  Patient reports shortness of breath at rest and dyspnea on exertion after each trastuzumab  infusion, which lasts for about 2 weeks after each treatment.  At baseline, she does not have any respiratory issues and denies any prior diagnosis of COPD.  (Of note, she quit smoking 15 years ago, and most recent CXR showed mild emphysematous changes.) she denies any cough, chest pain, peripheral edema, or palpitations.  She finished XRT last week.  ASSESSMENT & PLAN:  ## SOB (shortness of breath) - 2D echo from 01/20/2024 with LVEF 60 to 65%. - Reported recent worsening of dyspnea on exertion.  Repeat 2D echo on 04/10/2024 with LVEF 65 to 70%. - Chest x-ray from 03/31/2024 with no acute findings. - Reports worsening breathing for 2 weeks following her trastuzumab  infusions. - Last trastuzumab  given 05/06/2024. - Oxygen  today (05/13/2024): 91% on room air at rest Dropped to 85% on room air after ambulating 100 feet Improved to 93% on 2 L of supplemental oxygen  - Physical exam (05/13/2024): Lungs clear to auscultation bilaterally, but quiet lungs/decreased breath sounds overall.  No apparent respiratory distress at rest. - PLAN: Patient with Grade 2 dyspnea following trastuzumab  infusions. - While dyspnea is a common adverse reaction of patients receiving trastuzumab , Herceptin  and congenita can also result in  serious and fatal pulmonary toxicities.  Additionally, patient has history of provoked DVT in March 2024 following surgical repair of fracture.  Therefore, we will check stat CTA chest to rule out any VTE or other lung damage. - Recommend home oxygen  to be used with ambulation.  (Patient is extremely hesitant to agree to home oxygen , as she feels that it is just one more thing that she is not able to handle right now.)  # Left breast cancer - Current schedule includes ongoing trastuzumab  every 21 days (next on 9/3 and 9/24) - Appointment to establish care with Dr. Davonna on 07/08/2024 - PLAN: Per Dr. Davonna, we will try to move up her MD visit to be done on either 05/28/2023 or 06/18/2023   PLAN SUMMARY: >> Stat CTA chest to be done today >> Home oxygen  (Tomi Billy RN aware) >> Continue treatment as scheduled - will try to move up her MD appointment with Dr. Davonna    REVIEW OF SYSTEMS:   Review of Systems  Constitutional:  Negative for activity change, appetite change, chills, diaphoresis, fatigue, fever and unexpected weight change.  HENT:  Negative for mouth sores, nosebleeds, sore throat and trouble swallowing.   Respiratory:  Positive for shortness of breath. Negative for cough.   Cardiovascular:  Negative for chest pain, palpitations and leg swelling.  Gastrointestinal:  Negative for abdominal pain, blood in stool, constipation, diarrhea, nausea and vomiting.  Genitourinary:  Negative for dysuria and hematuria.  Neurological:  Negative for dizziness, light-headedness, numbness and headaches.  Psychiatric/Behavioral:  Positive for dysphoric mood. Negative for self-injury, sleep disturbance and suicidal ideas. The patient is nervous/anxious.     Past Medical History, Surgical history, Social  history, and Family history were reviewed as documented elsewhere in chart, and were updated as appropriate.   OBJECTIVE:  Physical Exam:  There were no vitals taken for this visit. ECOG:  1  Physical Exam Constitutional:      Appearance: Normal appearance. She is normal weight.  Cardiovascular:     Heart sounds: Normal heart sounds.  Pulmonary:     Breath sounds: Decreased air movement present. Decreased breath sounds present.  Neurological:     General: No focal deficit present.     Mental Status: Mental status is at baseline.  Psychiatric:        Behavior: Behavior normal. Behavior is cooperative.     Lab Review:     Component Value Date/Time   NA 141 05/06/2024 1239   NA 147 (H) 08/20/2023 1122   K 4.0 05/06/2024 1239   CL 107 05/06/2024 1239   CO2 23 05/06/2024 1239   GLUCOSE 111 (H) 05/06/2024 1239   BUN 14 05/06/2024 1239   BUN 12 08/20/2023 1122   CREATININE 0.44 05/06/2024 1239   CALCIUM  8.6 (L) 05/06/2024 1239   PROT 6.6 05/06/2024 1239   PROT 6.5 08/20/2023 1122   ALBUMIN 3.8 05/06/2024 1239   ALBUMIN 4.1 08/20/2023 1122   AST 22 05/06/2024 1239   ALT 9 05/06/2024 1239   ALKPHOS 67 05/06/2024 1239   BILITOT 0.5 05/06/2024 1239   BILITOT <0.2 08/20/2023 1122   GFRNONAA >60 05/06/2024 1239   GFRAA >60 05/29/2016 1145       Component Value Date/Time   WBC 6.0 05/06/2024 1239   RBC 4.45 05/06/2024 1239   HGB 14.4 05/06/2024 1239   HGB 15.0 08/20/2023 1122   HCT 44.6 05/06/2024 1239   HCT 46.4 08/20/2023 1122   PLT 193 05/06/2024 1239   PLT 228 08/20/2023 1122   MCV 100.2 (H) 05/06/2024 1239   MCV 95 08/20/2023 1122   MCH 32.4 05/06/2024 1239   MCHC 32.3 05/06/2024 1239   RDW 13.7 05/06/2024 1239   RDW 12.6 08/20/2023 1122   LYMPHSABS 1.2 05/06/2024 1239   MONOABS 0.4 05/06/2024 1239   EOSABS 0.1 05/06/2024 1239   BASOSABS 0.0 05/06/2024 1239   -------------------------------  Imaging from last 24 hours (if applicable): Radiology interpretation: No results found.    WRAP UP:  All questions were answered. The patient knows to call the clinic with any problems, questions or concerns.  Medical decision making:  Moderate  Time spent on visit: I spent 30 minutes counseling the patient face to face. The total time spent in the appointment was 55 minutes and more than 50% was on counseling.  Pleasant CHRISTELLA Barefoot, PA-C  05/13/24 10:12 AM

## 2024-05-13 NOTE — Progress Notes (Signed)
 SATURATION QUALIFICATIONS: (This note is used to comply with regulatory documentation for home oxygen )  Patient Saturations on Room Air at Rest = 91%  Patient Saturations on Room Air while Ambulating = 85%  Patient Saturations on 2 Liters of oxygen  while Ambulating = 98%  Please briefly explain why patient needs home oxygen :  Patient experiencing profound SOB with minimal exertion.  Per Pleasant Barefoot, PAC needs to wear while ambulating and when at rest if sat < 90%.  Oxygen  ordered via [facility] for delivery as soon as possible

## 2024-05-14 ENCOUNTER — Other Ambulatory Visit: Payer: Self-pay | Admitting: *Deleted

## 2024-05-14 DIAGNOSIS — I2699 Other pulmonary embolism without acute cor pulmonale: Secondary | ICD-10-CM

## 2024-05-14 MED ORDER — RIVAROXABAN (XARELTO) VTE STARTER PACK (15 & 20 MG): ORAL_TABLET | ORAL | 0 refills | Status: DC

## 2024-05-15 ENCOUNTER — Ambulatory Visit: Payer: Medicare Other

## 2024-05-15 ENCOUNTER — Other Ambulatory Visit: Payer: Self-pay

## 2024-05-15 VITALS — Ht 63.0 in | Wt 107.0 lb

## 2024-05-15 DIAGNOSIS — Z0000 Encounter for general adult medical examination without abnormal findings: Secondary | ICD-10-CM | POA: Diagnosis not present

## 2024-05-15 DIAGNOSIS — I2699 Other pulmonary embolism without acute cor pulmonale: Secondary | ICD-10-CM

## 2024-05-15 MED ORDER — RIVAROXABAN (XARELTO) VTE STARTER PACK (15 & 20 MG): ORAL_TABLET | ORAL | 0 refills | Status: DC

## 2024-05-15 NOTE — Progress Notes (Signed)
 Subjective:   Pamela Henson is a 73 y.o. who presents for a Medicare Wellness preventive visit.  As a reminder, Annual Wellness Visits don't include a physical exam, and some assessments may be limited, especially if this visit is performed virtually. We may recommend an in-person follow-up visit with your provider if needed.  Visit Complete: Virtual I connected with  Pamela Henson on 05/15/24 by a audio enabled telemedicine application and verified that I am speaking with the correct person using two identifiers.  Patient Location: Home  Provider Location: Home Office  I discussed the limitations of evaluation and management by telemedicine. The patient expressed understanding and agreed to proceed.  Vital Signs: Because this visit was a virtual/telehealth visit, some criteria may be missing or patient reported. Any vitals not documented were not able to be obtained and vitals that have been documented are patient reported.  VideoDeclined- This patient declined Librarian, academic. Therefore the visit was completed with audio only.  Persons Participating in Visit: Patient.  AWV Questionnaire: Yes: Patient Medicare AWV questionnaire was completed by the patient on 05/11/24; I have confirmed that all information answered by patient is correct and no changes since this date.  Cardiac Risk Factors include: advanced age (>6men, >2 women);hypertension;dyslipidemia     Objective:    Today's Vitals   05/15/24 1053  Weight: 107 lb (48.5 kg)  Height: 5' 3 (1.6 m)   Body mass index is 18.95 kg/m.     05/15/2024   11:03 AM 05/13/2024    8:02 AM 05/06/2024    1:00 PM 04/15/2024    2:07 PM 03/31/2024    1:33 PM 02/18/2024   10:19 AM 01/27/2024   12:28 PM  Advanced Directives  Does Patient Have a Medical Advance Directive? No Yes Yes Yes No No No  Type of Special educational needs teacher of [place];Living will Healthcare Power of [place];Living will  Healthcare Power of [place];Living will Healthcare Power of [place];Living will Healthcare Power of [place];Living will   Does patient want to make changes to medical advance directive?     No - Patient declined No - Patient declined No - Patient declined  Copy of Healthcare Power of Attorney in Chart?  No - copy requested No - copy requested No - copy requested No - copy requested No - copy requested   Would patient like information on creating a medical advance directive? Yes (MAU/Ambulatory/Procedural Areas - Information given) No - Patient declined No - Patient declined No - Patient declined No - Patient declined No - Patient declined No - Patient declined    Current Medications (verified) Outpatient Encounter Medications as of 05/15/2024  Medication Sig   acetaminophen  (TYLENOL ) 500 MG tablet Take 500 mg by mouth every 6 (six) hours as needed for mild pain, moderate pain or headache.   anastrozole  (ARIMIDEX ) 1 MG tablet Take 1 tablet (1 mg total) by mouth daily.   calcium -vitamin D  (OSCAL WITH D) 500-5 MG-MCG tablet Take 1 tablet by mouth 2 (two) times daily with a meal.   CARBOPLATIN  IV Inject into the vein.   Cholecalciferol  (VITAMIN D ) 50 MCG (2000 UT) tablet Take 2,000 Units by mouth daily.   DOCEtaxel  (TAXOTERE  IV) Inject into the vein.   gabapentin  (NEURONTIN ) 100 MG capsule Take 100 mg by mouth 3 (three) times daily.   lidocaine -prilocaine  (EMLA ) cream Apply to affected area once   melatonin 5 MG TABS Take 5 mg by mouth at bedtime as needed (sleep).  Melatonin-Pyridoxine 5-1 MG TABS Take 5 mg by mouth.   Multiple Vitamins-Minerals (MULTIVITAMIN WITH MINERALS) tablet Take 1 tablet by mouth daily.   prochlorperazine  (COMPAZINE ) 10 MG tablet Take 1 tablet (10 mg total) by mouth every 6 (six) hours as needed for nausea or vomiting.   RIVAROXABAN  (XARELTO ) VTE STARTER PACK (15 & 20 MG) Follow package directions: Take one 15mg  tablet by mouth twice a day. On day 22, switch to one 20mg   tablet once a day. Take with food.   rosuvastatin  (CRESTOR ) 5 MG tablet Take 1 tablet (5 mg total) by mouth daily.   sertraline  (ZOLOFT ) 50 MG tablet Take 50 mg by mouth at bedtime. (Patient taking differently: Take 50 mg by mouth at bedtime. Take one and a half tablets a day.)   tamsulosin  (FLOMAX ) 0.4 MG CAPS capsule Take 1 capsule (0.4 mg total) by mouth at bedtime.   traMADol  (ULTRAM ) 50 MG tablet Take 1 tablet (50 mg total) by mouth every 6 (six) hours as needed for severe pain (pain score 7-10).   Trastuzumab -anns (KANJINTI  IV) Inject into the vein.   [DISCONTINUED] RIVAROXABAN  (XARELTO ) VTE STARTER PACK (15 & 20 MG) Follow package directions: Take one 15mg  tablet by mouth twice a day. On day 22, switch to one 20mg  tablet once a day. Take with food.   No facility-administered encounter medications on file as of 05/15/2024.    Allergies (verified) Asa [aspirin], Eliquis [apixaban], and Oxycodone    History: Past Medical History:  Diagnosis Date   Allergy  2024   Anemia    Arthritis 1995   Bipolar 1 disorder (HCC)    Bipolar 1 disorder (HCC)    Cancer (HCC) 2024   Clotting disorder (HCC) 2024   Complication of anesthesia    Depression 2000   Headache    History of chicken pox    History of measles    History of mumps    Osteoporosis 2024   PONV (postoperative nausea and vomiting)    Stroke (HCC) 1975   Urine retention    takes Flomax    Past Surgical History:  Procedure Laterality Date   ABDOMINAL HYSTERECTOMY  09/24/1977   age 47 due to vaginal bleeding   BREAST BIOPSY Left 08/30/2023   US  LT BREAST BX W LOC DEV 1ST LESION IMG BX SPEC US  GUIDE 08/30/2023 [facility]   BREAST LUMPECTOMY Left 09/26/2023   COLONOSCOPY N/A 10/30/2017   Procedure: COLONOSCOPY;  Surgeon: Golda Claudis PENNER, MD;  [facility];  Service: Endoscopy;  Laterality: N/A;  1225   COLONOSCOPY WITH PROPOFOL  N/A 10/13/2022   Procedure: COLONOSCOPY WITH PROPOFOL ;  Surgeon: Eartha Angelia Sieving, MD;  Location: [facility];  Service: Gastroenterology;  Laterality: N/A;   ESOPHAGOGASTRODUODENOSCOPY (EGD) WITH PROPOFOL  N/A 10/13/2022   Procedure: ESOPHAGOGASTRODUODENOSCOPY (EGD) WITH PROPOFOL ;  Surgeon: Eartha Angelia Sieving, MD;  [facility]: [facility];  Service: Gastroenterology;  Laterality: N/A;   FRACTURE SURGERY  2024   ORIF TIBIA PLATEAU Right 12/12/2022   Procedure: OPEN REDUCTION INTERNAL FIXATION (ORIF) TIBIAL PLATEAU;  Surgeon: Kendal Franky SQUIBB, MD;  Location: [facility];  Service: Orthopedics;  Laterality: Right;   PARTIAL MASTECTOMY WITH AXILLARY SENTINEL LYMPH NODE BIOPSY Left 09/26/2023   Procedure: PARTIAL MASTECTOMY WITH AXILLARY SENTINEL LYMPH NODE BIOPSY;  Surgeon: Kallie Manuelita BROCKS, MD;  Location: [facility];  Service: General;  Laterality: Left;   PERIPHERAL VASCULAR THROMBECTOMY Right 11/2022   2 DVT following surgical intervention of fracture   POLYPECTOMY  10/13/2022   Procedure:  POLYPECTOMY;  Surgeon: Eartha Flavors, Toribio, MD;  [facility];  Service: Gastroenterology;;   PORTACATH PLACEMENT Right 11/01/2023   Procedure: INSERTION PORT-A-CATH;  Surgeon: Kallie Manuelita BROCKS, MD;  Location: [facility];  Service: General;  Laterality: Right;   Family History  Problem Relation Age of Onset   Stroke Mother 66   Early death Mother    Stroke Father 15   Early death Father    Colon cancer Neg Hx    Social History   Socioeconomic History   Marital status: Widowed    Spouse name: Not on file   Number of children: 1   Years of education: Not on file   Highest education level: Some college, no degree  Occupational History   Occupation: Disablity    Comment: Due to back problems  Tobacco Use   Smoking status: Former    Current packs/day: 0.00    Average packs/day: 1 pack/day for 15.0 years (15.0 ttl pk-yrs)    Types: Cigarettes    Start date: 09/25/1983    Quit date: 09/24/1998    Years since quitting: 25.6   Smokeless tobacco: Never    Tobacco comments:    I quit  Vaping Use   Vaping status: Never Used  Substance and Sexual Activity   Alcohol use: No   Drug use: No   Sexual activity: Not Currently  Other Topics Concern   Not on file  Social History Narrative   Not on file   Social Drivers of Health   Financial Resource Strain: Low Risk  (05/11/2024)   Overall Financial Resource Strain (CARDIA)    Difficulty of Paying Living Expenses: Not very hard  Food Insecurity: No Food Insecurity (05/11/2024)   Hunger Vital Sign    Worried About Running Out of Food in the Last Year: Never true    Ran Out of Food in the Last Year: Never true  Transportation Needs: No Transportation Needs (05/11/2024)   PRAPARE - Administrator, Civil Service (Medical): No    Lack of Transportation (Non-Medical): No  Physical Activity: Inactive (05/11/2024)   Exercise Vital Sign    Days of Exercise per Week: 0 days    Minutes of Exercise per Session: 0 min  Stress: Stress Concern Present (05/11/2024)   [employer] of Occupational Health - Occupational Stress Questionnaire    Feeling of Stress: To some extent  Social Connections: Socially Isolated (05/11/2024)   Social Connection and Isolation Panel    Frequency of Communication with Friends and Family: More than three times a week    Frequency of Social Gatherings with Friends and Family: Three times a week    Attends Religious Services: Never    Active Member of Clubs or Organizations: No    Attends Banker Meetings: Never    Marital Status: Widowed    Tobacco Counseling Counseling given: Not Answered Tobacco comments: I quit    Clinical Intake:  Pre-visit preparation completed: Yes  Pain : No/denies pain  Diabetes: No   How often do you need to have someone help you when you read instructions, pamphlets, or other written materials from your doctor or pharmacy?: 1 - Never  Interpreter Needed?: No  Information entered by :: Charmaine Bloodgood  LPN   Activities of Daily Living     05/15/2024   11:03 AM 11/01/2023    9:43 AM  In your present state of health, do you have any difficulty performing the following activities:  Hearing? 0  0  Vision? 0 0  Difficulty concentrating or making decisions? 0 0  Walking or climbing stairs? 0   Dressing or bathing? 0   Doing errands, shopping? 0   Preparing Food and eating ? N   Using the Toilet? N   In the past six months, have you accidently leaked urine? N   Do you have problems with loss of bowel control? N   Managing your Medications? N   Managing your Finances? N   Housekeeping or managing your Housekeeping? N     Patient Care Team: Cook, Jayce G, DO as PCP - General (Family Medicine) Blinda Ferry, MD as Consulting Physician (Psychiatry) Joshua Pao, NP as Nurse Practitioner (Psychiatry) Celestia Joesph SQUIBB, RN as Oncology Nurse Navigator (Medical Oncology) Lenis Ethelle ORN, MD as Consulting Physician (Endocrinology) Pennington, Rebekah M, PA-C as Physician Assistant (Oncology) Dannielle Alm BRAVO, MD as Referring Physician (Radiation Oncology) [facility]   I have updated your Care Teams any recent Medical Services you may have received from other providers in the past year.     Assessment:   This is a routine wellness examination for Pamela Henson.  Hearing/Vision screen Hearing Screening - Comments:: Denies hearing difficulties   Vision Screening - Comments:: Wears rx glasses - up to date with routine eye exams with Dr. Darroll    Goals Addressed             This Visit's Progress    Patient Stated   On track    To stay out of the hospital and get back to pre-injury health       Depression Screen     05/15/2024   10:44 AM 05/13/2024    7:57 AM 05/06/2024   12:56 PM 04/15/2024    2:06 PM 03/31/2024    1:31 PM 10/16/2023    2:27 PM 08/20/2023   10:28 AM  PHQ 2/9 Scores  PHQ - 2 Score 0 0 0 0 0 0 0  PHQ- 9 Score       0    Fall Risk      05/15/2024   10:43 AM 10/16/2023    2:26 PM 09/10/2023    8:59 AM 08/20/2023   10:28 AM 05/10/2023   12:47 PM  Fall Risk   Falls in the past year? 0 1 0 1 1  Number falls in past yr: 0 1 0 0 1  Injury with Fall? 0 1 1 1 1   Risk for fall due to : No Fall Risks History of fall(s);Impaired balance/gait No Fall Risks  History of fall(s);Impaired balance/gait;Orthopedic patient  Follow up Falls prevention discussed;Education provided;Falls evaluation completed Falls evaluation completed;Falls prevention discussed Falls evaluation completed  Education provided;Falls prevention discussed    MEDICARE RISK AT HOME:  Medicare Risk at Home Any stairs in or around the home?: No If so, are there any without handrails?: No Home free of loose throw rugs in walkways, pet beds, electrical cords, etc?: Yes Adequate lighting in your home to reduce risk of falls?: Yes Life alert?: No Use of a cane, walker or w/c?: No Grab bars in the bathroom?: Yes Shower chair or bench in shower?: No Elevated toilet seat or a handicapped toilet?: Yes  TIMED UP AND GO:  Was the test performed?  No  Cognitive Function: Declined/Normal: No cognitive concerns noted by patient or family. Patient alert, oriented, able to answer questions appropriately and recall recent events. No signs of memory loss or confusion.  05/10/2023   12:39 PM  6CIT Screen  What Year? 0 points  What month? 0 points  What time? 0 points  Count back from 20 0 points  Months in reverse 0 points  Repeat phrase 0 points  Total Score 0 points    Immunizations Immunization History  Administered Date(s) Administered   Fluad Trivalent(High Dose 65+) 08/17/2023   Moderna Covid-19 Fall Seasonal Vaccine 35yrs & older 08/17/2023   PNEUMOCOCCAL CONJUGATE-20 08/20/2023    Screening Tests Health Maintenance  Topic Date Due   DTaP/Tdap/Td (1 - Tdap) Never done   Zoster Vaccines- Shingrix (1 of 2) Never done   COVID-19 Vaccine (2 -  Moderna risk series) 09/14/2023   INFLUENZA VACCINE  06/23/2024 (Originally 04/24/2024)   Medicare Annual Wellness (AWV)  05/15/2025   MAMMOGRAM  08/26/2025   Colonoscopy  10/13/2029   Pneumococcal Vaccine: 50+ Years  Completed   DEXA SCAN  Completed   Hepatitis C Screening  Completed   HPV VACCINES  Aged Out   Meningococcal B Vaccine  Aged Out    Health Maintenance  Health Maintenance Due  Topic Date Due   DTaP/Tdap/Td (1 - Tdap) Never done   Zoster Vaccines- Shingrix (1 of 2) Never done   COVID-19 Vaccine (2 - Moderna risk series) 09/14/2023    Additional Screening:  Vision Screening: Recommended annual ophthalmology exams for early detection of glaucoma and other disorders of the eye. Would you like a referral to an eye doctor? No    Dental Screening: Recommended annual dental exams for proper oral hygiene  Community Resource Referral / Chronic Care Management: CRR required this visit?  No   CCM required this visit?  No   Plan:    I have personally reviewed and noted the following in the patient's chart:   Medical and social history Use of alcohol, tobacco or illicit drugs  Current medications and supplements including opioid prescriptions. Patient is not currently taking opioid prescriptions. Functional ability and status Nutritional status Physical activity Advanced directives List of other physicians Hospitalizations, surgeries, and ER visits in previous 12 months Vitals Screenings to include cognitive, depression, and falls Referrals and appointments  In addition, I have reviewed and discussed with patient certain preventive protocols, quality metrics, and best practice recommendations. A written personalized care plan for preventive services as well as general preventive health recommendations were provided to patient.   Lavelle Pfeiffer [place], [place]   1/77/7974   After Visit Summary: (MyChart) Due to this being a telephonic visit, the after visit  summary with patients personalized plan was offered to patient via MyChart   Notes: Nothing significant to report at this time.

## 2024-05-15 NOTE — Patient Instructions (Signed)
 Pamela Henson , Thank you for taking time out of your busy schedule to complete your Annual Wellness Visit with me. I enjoyed our conversation and look forward to speaking with you again next year. I, as well as your care team,  appreciate your ongoing commitment to your health goals. Please review the following plan we discussed and let me know if I can assist you in the future. Your Game plan/ To Do List     Follow up Visits: We will see or speak with you next year for your Next Medicare AWV with our clinical staff Have you seen your provider in the last 6 months (3 months if uncontrolled diabetes)? No  Clinician Recommendations:  Aim for 30 minutes of exercise or brisk walking, 6-8 glasses of water, and 5 servings of fruits and vegetables each day.       This is a list of the screenings recommended for you:  Health Maintenance  Topic Date Due   DTaP/Tdap/Td vaccine (1 - Tdap) Never done   Zoster (Shingles) Vaccine (1 of 2) Never done   COVID-19 Vaccine (2 - Moderna risk series) 09/14/2023   Flu Shot  06/23/2024*   Medicare Annual Wellness Visit  05/15/2025   Mammogram  08/26/2025   Colon Cancer Screening  10/13/2029   Pneumococcal Vaccine for age over 71  Completed   DEXA scan (bone density measurement)  Completed   Hepatitis C Screening  Completed   HPV Vaccine  Aged Out   Meningitis B Vaccine  Aged Out  *Topic was postponed. The date shown is not the original due date.    Advanced directives: (ACP Link)Information on Advanced Care Planning can be found at   Secretary of [facility] Advance Health Care Directives Advance Health Care Directives. [email or web address]   Advance Care Planning is important because it:  [x]  Makes sure you receive the medical care that is consistent with your values, goals, and preferences  [x]  It provides guidance to your family and loved ones and reduces their decisional burden about whether or not they are making the right decisions based on your  wishes.  Follow the link provided in your after visit summary or read over the paperwork we have mailed to you to help you started getting your Advance Directives in place. If you need assistance in completing these, please reach out to us  so that we can help you!  See attachments for Preventive Care and Fall Prevention Tips.

## 2024-05-17 ENCOUNTER — Other Ambulatory Visit: Payer: Self-pay

## 2024-05-27 ENCOUNTER — Inpatient Hospital Stay

## 2024-05-27 ENCOUNTER — Inpatient Hospital Stay: Attending: Hematology

## 2024-05-27 VITALS — BP 145/66 | HR 65 | Temp 97.8°F | Resp 18

## 2024-05-27 DIAGNOSIS — M810 Age-related osteoporosis without current pathological fracture: Secondary | ICD-10-CM | POA: Diagnosis not present

## 2024-05-27 DIAGNOSIS — C50412 Malignant neoplasm of upper-outer quadrant of left female breast: Secondary | ICD-10-CM | POA: Insufficient documentation

## 2024-05-27 DIAGNOSIS — Z86718 Personal history of other venous thrombosis and embolism: Secondary | ICD-10-CM | POA: Diagnosis not present

## 2024-05-27 DIAGNOSIS — Z86711 Personal history of pulmonary embolism: Secondary | ICD-10-CM | POA: Diagnosis not present

## 2024-05-27 DIAGNOSIS — Z1722 Progesterone receptor negative status: Secondary | ICD-10-CM | POA: Insufficient documentation

## 2024-05-27 DIAGNOSIS — R232 Flushing: Secondary | ICD-10-CM | POA: Insufficient documentation

## 2024-05-27 DIAGNOSIS — Z8673 Personal history of transient ischemic attack (TIA), and cerebral infarction without residual deficits: Secondary | ICD-10-CM | POA: Insufficient documentation

## 2024-05-27 DIAGNOSIS — Z923 Personal history of irradiation: Secondary | ICD-10-CM | POA: Diagnosis not present

## 2024-05-27 DIAGNOSIS — Z170 Estrogen receptor positive status [ER+]: Secondary | ICD-10-CM | POA: Insufficient documentation

## 2024-05-27 DIAGNOSIS — Z1731 Human epidermal growth factor receptor 2 positive status: Secondary | ICD-10-CM | POA: Diagnosis not present

## 2024-05-27 DIAGNOSIS — Z5112 Encounter for antineoplastic immunotherapy: Secondary | ICD-10-CM | POA: Diagnosis not present

## 2024-05-27 DIAGNOSIS — Z7901 Long term (current) use of anticoagulants: Secondary | ICD-10-CM | POA: Diagnosis not present

## 2024-05-27 DIAGNOSIS — Z79811 Long term (current) use of aromatase inhibitors: Secondary | ICD-10-CM | POA: Diagnosis not present

## 2024-05-27 LAB — CBC WITH DIFFERENTIAL/PLATELET
Abs Immature Granulocytes: 0.01 K/uL (ref 0.00–0.07)
Basophils Absolute: 0 K/uL (ref 0.0–0.1)
Basophils Relative: 1 %
Eosinophils Absolute: 0.1 K/uL (ref 0.0–0.5)
Eosinophils Relative: 2 %
HCT: 43.1 % (ref 36.0–46.0)
Hemoglobin: 14.1 g/dL (ref 12.0–15.0)
Immature Granulocytes: 0 %
Lymphocytes Relative: 22 %
Lymphs Abs: 1.3 K/uL (ref 0.7–4.0)
MCH: 32.7 pg (ref 26.0–34.0)
MCHC: 32.7 g/dL (ref 30.0–36.0)
MCV: 100 fL (ref 80.0–100.0)
Monocytes Absolute: 0.3 K/uL (ref 0.1–1.0)
Monocytes Relative: 5 %
Neutro Abs: 4 K/uL (ref 1.7–7.7)
Neutrophils Relative %: 70 %
Platelets: 192 K/uL (ref 150–400)
RBC: 4.31 MIL/uL (ref 3.87–5.11)
RDW: 13.7 % (ref 11.5–15.5)
WBC: 5.8 K/uL (ref 4.0–10.5)
nRBC: 0 % (ref 0.0–0.2)

## 2024-05-27 LAB — COMPREHENSIVE METABOLIC PANEL WITH GFR
ALT: 10 U/L (ref 0–44)
AST: 21 U/L (ref 15–41)
Albumin: 3.5 g/dL (ref 3.5–5.0)
Alkaline Phosphatase: 71 U/L (ref 38–126)
Anion gap: 10 (ref 5–15)
BUN: 15 mg/dL (ref 8–23)
CO2: 24 mmol/L (ref 22–32)
Calcium: 9 mg/dL (ref 8.9–10.3)
Chloride: 104 mmol/L (ref 98–111)
Creatinine, Ser: 0.54 mg/dL (ref 0.44–1.00)
GFR, Estimated: >60 mL/min (ref >60)
Glucose, Bld: 129 mg/dL — ABNORMAL HIGH (ref 70–99)
Potassium: 3.7 mmol/L (ref 3.5–5.1)
Sodium: 138 mmol/L (ref 135–145)
Total Bilirubin: 0.5 mg/dL (ref 0.0–1.2)
Total Protein: 6.3 g/dL — ABNORMAL LOW (ref 6.5–8.1)

## 2024-05-27 LAB — MAGNESIUM: Magnesium: 2.2 mg/dL (ref 1.7–2.4)

## 2024-05-27 MED ORDER — SODIUM CHLORIDE 0.9% FLUSH: 10.0000 mL | INTRAVENOUS | Status: DC | PRN

## 2024-05-27 MED ORDER — TRASTUZUMAB-ANNS CHEMO 150 MG IV SOLR
6.0000 mg/kg | Freq: Once | INTRAVENOUS | Status: AC
  Administered 2024-05-27: 300 mg via INTRAVENOUS
  Filled 2024-05-27: qty 14.29

## 2024-05-27 MED ORDER — SODIUM CHLORIDE 0.9 % IV SOLN
INTRAVENOUS | Status: DC
Start: 2024-05-27 — End: 2024-05-27

## 2024-05-27 MED ORDER — HEPARIN SOD (PORK) LOCK FLUSH 100 UNIT/ML IV SOLN
500.0000 [IU] | Freq: Once | INTRAVENOUS | Status: DC | PRN
Start: 2024-05-27 — End: 2024-05-27

## 2024-05-27 NOTE — Patient Instructions (Addendum)
 [facility] [place] - A DEPT OF [facility]  Discharge Instructions: Thank you for choosing [facility] to provide your oncology and hematology care.  If you have a lab appointment with the Cancer Center - please note that after April 8th, 2024, all labs will be drawn in the cancer center.  You do not have to check in or register with the main entrance as you have in the past but will complete your check-in in the cancer center.  Wear comfortable clothing and clothing appropriate for easy access to any Portacath or PICC line.   We strive to give you quality time with your provider. You may need to reschedule your appointment if you arrive late (15 or more minutes).  Arriving late affects you and other patients whose appointments are after yours.  Also, if you miss three or more appointments without notifying the office, you may be dismissed from the clinic at the provider's discretion.      For prescription refill requests, have your pharmacy contact our office and allow 72 hours for refills to be completed.    Today you received the following chemotherapy and/or immunotherapy agents Kanjinti .  Trastuzumab  Injection What is this medication? TRASTUZUMAB  (tras TOO zoo mab) treats breast cancer and stomach cancer. It works by blocking a protein that causes cancer cells to grow and multiply. This helps to slow or stop the spread of cancer cells. This medicine may be used for other purposes; ask your health care provider or pharmacist if you have questions. COMMON BRAND NAME(S): Herceptin , HERCESSI, Herzuma , KANJINTI , Ogivri , Ontruzant , Trazimera  What should I tell my care team before I take this medication? They need to know if you have any of these conditions: Heart failure Lung disease An unusual or allergic reaction to trastuzumab , other medications, foods, dyes, or preservatives Pregnant or trying to get pregnant Breast-feeding How should I use this  medication? This medication is injected into a vein. It is given by your care team in a hospital or clinic setting. Talk to your care team about the use of this medication in children. It is not approved for use in children. Overdosage: If you think you have taken too much of this medicine contact a poison control center or emergency room at once. NOTE: This medicine is only for you. Do not share this medicine with others. What if I miss a dose? Keep appointments for follow-up doses. It is important not to miss your dose. Call your care team if you are unable to keep an appointment. What may interact with this medication? Certain types of chemotherapy, such as daunorubicin, doxorubicin, epirubicin, idarubicin This list may not describe all possible interactions. Give your health care provider a list of all the medicines, herbs, non-prescription drugs, or dietary supplements you use. Also tell them if you smoke, drink alcohol, or use illegal drugs. Some items may interact with your medicine. What should I watch for while using this medication? Your condition will be monitored carefully while you are receiving this medication. This medication may make you feel generally unwell. This is not uncommon, as chemotherapy affects healthy cells as well as cancer cells. Report any side effects. Continue your course of treatment even though you feel ill unless your care team tells you to stop. This medication may increase your risk of getting an infection. Call your care team for advice if you get a fever, chills, sore throat, or other symptoms of a cold or flu. Do  not treat yourself. Try to avoid being around people who are sick. Avoid taking medications that contain aspirin, acetaminophen , ibuprofen , naproxen , or ketoprofen unless instructed by your care team. These medications can hide a fever. Talk to your care team if you may be pregnant. Serious birth defects can occur if you take this medication during  pregnancy and for 7 months after the last dose. You will need a negative pregnancy test before starting this medication. Contraception is recommended while taking this medication and for 7 months after the last dose. Your care team can help you find the option that works for you. Do not breastfeed while taking this medication and for 7 months after stopping treatment. What side effects may I notice from receiving this medication? Side effects that you should report to your care team as soon as possible: Allergic reactions or angioedema--skin rash, itching or hives, swelling of the face, eyes, lips, tongue, arms, or legs, trouble swallowing or breathing Dry cough, shortness of breath or trouble breathing Heart failure--shortness of breath, swelling of the ankles, feet, or hands, sudden weight gain, unusual weakness or fatigue Infection--fever, chills, cough, or sore throat Infusion reactions--chest pain, shortness of breath or trouble breathing, feeling faint or lightheaded Side effects that usually do not require medical attention (report to your care team if they continue or are bothersome): Diarrhea Dizziness Headache Nausea Trouble sleeping Vomiting This list may not describe all possible side effects. Call your doctor for medical advice about side effects. You may report side effects to FDA at 1-800-FDA-1088. Where should I keep my medication? This medication is given in a hospital or clinic. It will not be stored at home. NOTE: This sheet is a summary. It may not cover all possible information. If you have questions about this medicine, talk to your doctor, pharmacist, or health care provider.  2024 Elsevier/Gold Standard (2022-01-23 00:00:00)       To help prevent nausea and vomiting after your treatment, we encourage you to take your nausea medication as directed.  BELOW ARE SYMPTOMS THAT SHOULD BE REPORTED IMMEDIATELY: *FEVER GREATER THAN 100.4 F (38 C) OR HIGHER *CHILLS OR  SWEATING *NAUSEA AND VOMITING THAT IS NOT CONTROLLED WITH YOUR NAUSEA MEDICATION *UNUSUAL SHORTNESS OF BREATH *UNUSUAL BRUISING OR BLEEDING *URINARY PROBLEMS (pain or burning when urinating, or frequent urination) *BOWEL PROBLEMS (unusual diarrhea, constipation, pain near the anus) TENDERNESS IN MOUTH AND THROAT WITH OR WITHOUT PRESENCE OF ULCERS (sore throat, sores in mouth, or a toothache) UNUSUAL RASH, SWELLING OR PAIN  UNUSUAL VAGINAL DISCHARGE OR ITCHING   Items with * indicate a potential emergency and should be followed up as soon as possible or go to the Emergency Department if any problems should occur.  Please show the CHEMOTHERAPY ALERT CARD or IMMUNOTHERAPY ALERT CARD at check-in to the Emergency Department and triage nurse.  Should you have questions after your visit or need to cancel or reschedule your appointment, please contact [facility] [place] - A DEPT OF [facility] [phone]  and follow the prompts.  Office hours are 8:00 a.m. to 4:30 p.m. Monday - Friday. Please note that voicemails left after 4:00 p.m. may not be returned until the following business day.  We are closed weekends and major holidays. You have access to a nurse at all times for urgent questions. Please call the main number to the clinic [phone] and follow the prompts.  For any non-urgent questions, you may also contact your provider using MyChart. We now  offer e-Visits for anyone 17 and older to request care online for non-urgent symptoms. For details visit [email or web address].   Also download the MyChart app! Go to the app store, search MyChart, open the app, select [facility], and log in with your MyChart username and password.

## 2024-05-27 NOTE — Progress Notes (Signed)
 Patient took premedications prior to visit.

## 2024-05-27 NOTE — Progress Notes (Signed)
 Patient tolerated treatment well with no complaints voiced.  Patient left ambulatory in stable condition.  Vital signs stable at discharge.  Follow up as scheduled.

## 2024-06-02 ENCOUNTER — Other Ambulatory Visit: Payer: Self-pay | Admitting: Family Medicine

## 2024-06-02 ENCOUNTER — Other Ambulatory Visit: Payer: Self-pay

## 2024-06-02 MED ORDER — ROSUVASTATIN CALCIUM 5 MG PO TABS: 5.0000 mg | ORAL_TABLET | Freq: Every day | ORAL | 3 refills | Status: AC

## 2024-06-02 NOTE — Telephone Encounter (Signed)
 Copied from CRM [id number]. Topic: Clinical - Medication Refill >> Jun 02, 2024 11:28 AM Precious C wrote: Medication: rosuvastatin  (CRESTOR ) 5 MG tablet  Has the patient contacted their pharmacy? No, prefer pharmacy change (Agent: If no, request that the patient contact the pharmacy for the refill. If patient does not wish to contact the pharmacy document the reason why and proceed with request.) (Agent: If yes, when and what did the pharmacy advise?)  This is the patient's preferred pharmacy:  [facility] [place], [place] - [id number] [place] [address] Phone: [phone] Fax: [phone]  Is this the correct pharmacy for this prescription? Yes If no, delete pharmacy and type the correct one.   Has the prescription been filled recently? No  Is the patient out of the medication? Yes, 1 wks worth left  Has the patient been seen for an appointment in the last year OR does the patient have an upcoming appointment? Yes  Can we respond through MyChart? Yes  Agent: Please be advised that Rx refills may take up to 3 business days. We ask that you follow-up with your pharmacy.

## 2024-06-11 ENCOUNTER — Encounter: Payer: Self-pay | Admitting: *Deleted

## 2024-06-11 NOTE — Progress Notes (Signed)
 Home O2 ordered for patient per recommendations from [facility], [facility]  Patient was not sure if she wanted to proceed with home 02, however order was placed.  Received message from [facility] that they had attempted to contact patient to discuss co-pay multiple times, therefore order was cancelled.  [facility] made aware of patient's decision to opt out of oxygen  therapy.

## 2024-06-16 NOTE — Progress Notes (Signed)
 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 female was transferred to my care after her prior physician has left.   ASSESSMENT & PLAN:   Assessment & Plan: [name]  is a 73 y.o. 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  Will continue anastrozole  1 mg daily - Will continue Herceptin  every 3 weeks for a total of 52 weeks - Last mammogram was from 08/2023.  Will repeat in December 2025 - Last DEXA scan was in 07/2023 with a T-score of -4.3.  Will repeat in 2 years - Continue vitamin D  and calcium  -Last echo from 03/2024: LVEF: 65 to 70%.  Will repeat in 6 months that is 09/2024  Return to clinic for follow-up in 6 weeks. 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  Will continue for a lifetime because of recurrent thrombosis also in the setting of breast carcinoma - Patient ran out of Xarelto  on Monday.  Recommended compliance with Xarelto  not missing doses.  Will send a refill today. Osteoporosis, unspecified osteoporosis type, unspecified pathological fracture presence Last DEXA scan from 07/2023 with a T-score of -4.3 Gets Reclast /zoledronic  acid injections every year.  Last one was in 09/2023  - Will repeat Reclast  in 09/2024 - Continue calcium  and vitamin D  - Will repeat DEXA scan in 2 years that is 07/2025    The patient understands the plans discussed today and is in agreement with them.  She knows to contact our office if she develops concerns prior to her next appointment.  40 minutes of total time was spent for this patient encounter, including preparation,review of records,  face-to-face counseling with the patient and coordination of care, physical exam, and documentation of the encounter.    LILLETTE Verneta SAUNDERS Teague,acting as a Neurosurgeon for Pamela Henson.,have documented all relevant documentation on the behalf of Pamela Henson,as directed by  Pamela Henson while in the presence of Pamela Henson.  I, Pamela Dry Henson, have reviewed the above documentation for accuracy and completeness, and I agree with the above.     Pamela Henson  [facility] [place] - A DEPT OF [facility] [address] [place] [address] Dept: [phone] Dept Fax: [phone]   Orders  Placed This Encounter  Procedures   MM DIAG BREAST TOMO BILATERAL    Standing Status:   Future    Expected Date:   09/16/2024    Expiration Date:   06/17/2025    Reason for Exam (SYMPTOM  OR DIAGNOSIS REQUIRED):   breast cancer screening    Preferred imaging location?:   [facility]      ONCOLOGY HISTORY:   I have reviewed her chart and materials related to her cancer extensively and collaborated history with the patient. 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 -08/30/2023: Left breast core biopsy.  Pathology: Invasive ductal carcinoma, 1.7 cm. DCIS solid and cribriform types, intermediate nuclear grade. No lymphovascular invasion identified. No calcifications identified. Tumor cells are ER positive (95%), Her2 positive (3+), and PR negative. Ki-67 is 5%.  -09/06/2023: CT chest: No new or progressive interval findings to suggest metastatic disease in the chest. -09/26/2023: Left breast lumpectomy and SNLB.  Pathology: Invasive ductal carcinoma, 2.4 cm, grade 2. Ductal carcinoma in situ, intermediate-grade. Resection margins are negative for carcinoma. Negative for lymphovascular or perineural invasion. Three lymph nodes, negative for carcinoma (0/3). Pathologic staging: pT2, pN0  -11/04/2023-02/18/2024: 6 cycles of TCH  -02/19/2024-current: Maintenance Herceptin  every 3 weeks -03/31/2024-current: Anastrozole  1mg  daily -05/01/2024-05/06/2024: Radiation therapy to left breast and chest wall  Current Treatment: Maintenance Herceptin  plus anastrozole   INTERVAL HISTORY:   Pamela Henson is here today for follow up and to establish care with me for left breast cancer.   She reported occasional hot flashes today and has no other complaints.  She does not have any arthralgias.  She denies shortness of breath and stated that it has significantly improved since the starting of Xarelto .  She stated that she ran out of Xarelto  on Monday and has not taken it since then.   I have reviewed the past  medical history, past surgical history, social history and family history with the patient and they are unchanged from previous note.  ALLERGIES:  is allergic to asa [aspirin], eliquis [apixaban], and oxycodone .  MEDICATIONS:  Current Outpatient Medications  Medication Sig Dispense Refill   acetaminophen  (TYLENOL ) 500 MG tablet Take 500 mg by mouth every 6 (six) hours as needed for mild pain, moderate pain or headache.     anastrozole  (ARIMIDEX ) 1 MG tablet Take 1 tablet (1 mg total) by mouth daily. 30 tablet 5   calcium -vitamin D  (OSCAL WITH D) 500-5 MG-MCG tablet Take 1 tablet by mouth 2 (two) times daily with a meal. 180 tablet 1   Cholecalciferol  (VITAMIN D ) 50 MCG (2000 UT) tablet Take 2,000 Units by mouth daily.     gabapentin  (NEURONTIN ) 100 MG capsule Take 100 mg by mouth 3 (three) times daily.     lidocaine -prilocaine  (EMLA ) cream Apply to affected area once 30 g 3   melatonin 5 MG TABS Take 5 mg by mouth at bedtime as needed (sleep).     Melatonin-Pyridoxine 5-1 MG TABS Take 5 mg by mouth.     Multiple Vitamins-Minerals (MULTIVITAMIN WITH MINERALS) tablet Take 1 tablet by mouth daily.     rivaroxaban  (XARELTO ) 20 MG TABS tablet Take 1 tablet (20 mg total) by mouth daily with supper. 30 tablet  12   rosuvastatin  (CRESTOR ) 5 MG tablet Take 1 tablet (5 mg total) by mouth daily. 90 tablet 3   sertraline  (ZOLOFT ) 50 MG tablet Take 50 mg by mouth at bedtime. (Patient taking differently: Take 50 mg by mouth at bedtime. Take one and a half tablets a day.)     tamsulosin  (FLOMAX ) 0.4 MG CAPS capsule Take 1 capsule (0.4 mg total) by mouth at bedtime. 90 capsule 3   Trastuzumab -anns (KANJINTI  IV) Inject into the vein.     No current facility-administered medications for this visit.   Facility-Administered Medications Ordered in Other Visits  Medication Dose Route Frequency Provider Last Rate Last Admin   0.9 %  sodium chloride  infusion   Intravenous Continuous Nolie Bignell, Henson   Stopped  at 06/17/24 1132   acetaminophen  (TYLENOL ) tablet 650 mg  650 mg Oral Once Skylor Hughson, Henson       diphenhydrAMINE  (BENADRYL ) capsule 25 mg  25 mg Oral Once Sharay Bellissimo, Henson        REVIEW OF SYSTEMS:   Constitutional: Denies fevers, chills or abnormal weight loss Eyes: Denies blurriness of vision Ears, nose, mouth, throat, and face: Denies mucositis or sore throat Respiratory: Denies cough, dyspnea or wheezes Cardiovascular: Denies palpitation, chest discomfort or lower extremity swelling Gastrointestinal:  Denies nausea, heartburn or change in bowel habits Skin: Denies abnormal skin rashes Lymphatics: Denies new lymphadenopathy or easy bruising Neurological:Denies numbness, tingling or new weaknesses Behavioral/Psych: Mood is stable, no new changes  All other systems were reviewed with the patient and are negative.   VITALS:  Weight 107 lb 9.6 oz (48.8 kg).  Wt Readings from Last 3 Encounters:  06/17/24 107 lb 9.6 oz (48.8 kg)  05/27/24 107 lb 12.8 oz (48.9 kg)  05/15/24 107 lb (48.5 kg)    Body mass index is 19.06 kg/m.  Performance status (ECOG): 0 - Asymptomatic  PHYSICAL EXAM:   GENERAL:alert, no distress and comfortable SKIN: skin color, texture, turgor are normal, no rashes or significant lesions BREAST: Right breast exam: Normal.  Left breast exam: Some radiation skin changes, no lumps palpated.  No axillary lymphadenopathy palpated. LYMPH:  no palpable lymphadenopathy in the cervical, axillary or inguinal LUNGS: clear to auscultation and percussion with normal breathing effort HEART: regular rate & rhythm and no murmurs and no lower extremity edema ABDOMEN:abdomen soft, non-tender and normal bowel sounds Musculoskeletal:no cyanosis of digits and no clubbing  NEURO: alert & oriented x 3 with fluent speech, no focal motor/sensory deficits  LABORATORY DATA:  I have reviewed the data as listed   Lab Results  Component Value Date   WBC 5.9 06/17/2024    NEUTROABS 4.0 06/17/2024   HGB 13.8 06/17/2024   HCT 42.6 06/17/2024   MCV 100.2 (H) 06/17/2024   PLT 198 06/17/2024      Chemistry      Component Value Date/Time   NA 140 06/17/2024 0821   NA 147 (H) 08/20/2023 1122   K 4.1 06/17/2024 0821   CL 104 06/17/2024 0821   CO2 25 06/17/2024 0821   BUN 14 06/17/2024 0821   BUN 12 08/20/2023 1122   CREATININE 0.55 06/17/2024 0821      Component Value Date/Time   CALCIUM  8.8 (L) 06/17/2024 0821   ALKPHOS 70 06/17/2024 0821   AST 24 06/17/2024 0821   ALT 9 06/17/2024 0821   BILITOT 0.6 06/17/2024 0821   BILITOT <0.2 08/20/2023 1122       RADIOGRAPHIC STUDIES: I have personally reviewed the  radiological images as listed and agreed with the findings in the report.  CT Angio Chest Pulmonary Embolism (PE) W or WO Contrast EXAM: CTA of the Chest with contrast for PE 05/13/2024 11:46:49 AM  TECHNIQUE: CTA of the chest was performed after the administration of intravenous contrast. Multiplanar reformatted images are provided for review. MIP images are provided for review. Automated exposure control, iterative reconstruction, and/or weight based adjustment of the mA/kV was utilized to reduce the radiation dose to as low as reasonably achievable.  COMPARISON: CT of the chest dated 09/05/2001.  CLINICAL HISTORY: SOB, hypoxia, hx DVT, active breast cancer.  FINDINGS:  PULMONARY ARTERIES: Pulmonary arteries are adequately opacified for evaluation. No definite evidence of pulmonary embolus. However, there appears to be thrombus within the segmental veins of the right lower lobe. The left lower lobe venous network is well opacified, but the right lower lobe network appears occluded with thrombus. The main pulmonary artery is mildly dilated, measuring 3 cm in diameter.  MEDIASTINUM: The heart and pericardium demonstrate no acute abnormality. The ascending thoracic aorta measures 3.3 cm in diameter. The descending thoracic aorta  is normal in caliber.  LYMPH NODES: No mediastinal, hilar or axillary lymphadenopathy.  LUNGS AND PLEURA: There is moderate central lobular emphysema. There is mucus plugging within the right lower lobe bronchi. No pleural effusion or pneumothorax.  UPPER ABDOMEN: There is a nonobstructive calculus within the right kidney measuring 6 mm.  SOFT TISSUES AND BONES: No acute bone or soft tissue abnormality.  IMPRESSION: 1. Thrombus within the segmental veins of the right lower lobe, with occlusion of the right lower lobe venous network. No definite evidence of pulmonary embolus. 2. Moderate central lobular emphysema and mucus plugging within the right lower lobe bronchi. 3. Main pulmonary artery mildly dilated, measuring 3 cm in diameter.  Electronically signed by: Evalene Coho Henson 05/13/2024 12:02 PM EDT RP Workstation: [id number]

## 2024-06-17 ENCOUNTER — Inpatient Hospital Stay

## 2024-06-17 ENCOUNTER — Ambulatory Visit

## 2024-06-17 ENCOUNTER — Inpatient Hospital Stay (HOSPITAL_BASED_OUTPATIENT_CLINIC_OR_DEPARTMENT_OTHER): Admitting: Oncology

## 2024-06-17 VITALS — BP 118/67 | HR 73 | Resp 18

## 2024-06-17 VITALS — Wt 107.6 lb

## 2024-06-17 DIAGNOSIS — Z170 Estrogen receptor positive status [ER+]: Secondary | ICD-10-CM

## 2024-06-17 DIAGNOSIS — M810 Age-related osteoporosis without current pathological fracture: Secondary | ICD-10-CM | POA: Diagnosis not present

## 2024-06-17 DIAGNOSIS — Z86711 Personal history of pulmonary embolism: Secondary | ICD-10-CM | POA: Diagnosis not present

## 2024-06-17 DIAGNOSIS — I2699 Other pulmonary embolism without acute cor pulmonale: Secondary | ICD-10-CM | POA: Insufficient documentation

## 2024-06-17 DIAGNOSIS — C50412 Malignant neoplasm of upper-outer quadrant of left female breast: Secondary | ICD-10-CM | POA: Diagnosis not present

## 2024-06-17 DIAGNOSIS — Z5112 Encounter for antineoplastic immunotherapy: Secondary | ICD-10-CM | POA: Diagnosis not present

## 2024-06-17 LAB — CBC WITH DIFFERENTIAL/PLATELET
Abs Immature Granulocytes: 0.01 K/uL (ref 0.00–0.07)
Basophils Absolute: 0.1 K/uL (ref 0.0–0.1)
Basophils Relative: 1 %
Eosinophils Absolute: 0.1 K/uL (ref 0.0–0.5)
Eosinophils Relative: 2 %
HCT: 42.6 % (ref 36.0–46.0)
Hemoglobin: 13.8 g/dL (ref 12.0–15.0)
Immature Granulocytes: 0 %
Lymphocytes Relative: 22 %
Lymphs Abs: 1.3 K/uL (ref 0.7–4.0)
MCH: 32.5 pg (ref 26.0–34.0)
MCHC: 32.4 g/dL (ref 30.0–36.0)
MCV: 100.2 fL — ABNORMAL HIGH (ref 80.0–100.0)
Monocytes Absolute: 0.4 K/uL (ref 0.1–1.0)
Monocytes Relative: 7 %
Neutro Abs: 4 K/uL (ref 1.7–7.7)
Neutrophils Relative %: 68 %
Platelets: 198 K/uL (ref 150–400)
RBC: 4.25 MIL/uL (ref 3.87–5.11)
RDW: 13.8 % (ref 11.5–15.5)
WBC: 5.9 K/uL (ref 4.0–10.5)
nRBC: 0 % (ref 0.0–0.2)

## 2024-06-17 LAB — COMPREHENSIVE METABOLIC PANEL WITH GFR
ALT: 9 U/L (ref 0–44)
AST: 24 U/L (ref 15–41)
Albumin: 3.5 g/dL (ref 3.5–5.0)
Alkaline Phosphatase: 70 U/L (ref 38–126)
Anion gap: 11 (ref 5–15)
BUN: 14 mg/dL (ref 8–23)
CO2: 25 mmol/L (ref 22–32)
Calcium: 8.8 mg/dL — ABNORMAL LOW (ref 8.9–10.3)
Chloride: 104 mmol/L (ref 98–111)
Creatinine, Ser: 0.55 mg/dL (ref 0.44–1.00)
GFR, Estimated: >60 mL/min (ref >60)
Glucose, Bld: 98 mg/dL (ref 70–99)
Potassium: 4.1 mmol/L (ref 3.5–5.1)
Sodium: 140 mmol/L (ref 135–145)
Total Bilirubin: 0.6 mg/dL (ref 0.0–1.2)
Total Protein: 6.3 g/dL — ABNORMAL LOW (ref 6.5–8.1)

## 2024-06-17 LAB — MAGNESIUM: Magnesium: 2.3 mg/dL (ref 1.7–2.4)

## 2024-06-17 MED ORDER — RIVAROXABAN 20 MG PO TABS: 20.0000 mg | ORAL_TABLET | Freq: Every day | ORAL | 12 refills | Status: AC

## 2024-06-17 MED ORDER — ACETAMINOPHEN 325 MG PO TABS: 650.0000 mg | ORAL_TABLET | Freq: Once | ORAL | Status: DC

## 2024-06-17 MED ORDER — TRASTUZUMAB-ANNS CHEMO 150 MG IV SOLR
6.0000 mg/kg | Freq: Once | INTRAVENOUS | Status: AC
  Administered 2024-06-17: 300 mg via INTRAVENOUS
  Filled 2024-06-17: qty 14.29

## 2024-06-17 MED ORDER — SODIUM CHLORIDE 0.9 % IV SOLN: INTRAVENOUS | Status: DC

## 2024-06-17 MED ORDER — DIPHENHYDRAMINE HCL 25 MG PO CAPS: 25.0000 mg | ORAL_CAPSULE | Freq: Once | ORAL | Status: DC

## 2024-06-17 NOTE — Progress Notes (Signed)
 Patient presents today for Kanjinti  infusion. Patient is in satisfactory condition with no new complaints voiced.  Vital signs are stable.  Labs reviewed by Dr. Davonna during the office visit and all labs are within treatment parameters.  We will proceed with treatment per MD orders.   Patient took pre-meds at home prior to arrival.  Treatment given today per MD orders. Tolerated infusion without adverse affects. Vital signs stable. No complaints at this time. Discharged from clinic ambulatory in stable condition. Alert and oriented x 3. F/U with [facility] as scheduled.

## 2024-06-17 NOTE — Assessment & Plan Note (Addendum)
 Last DEXA scan from 07/2023 with a T-score of -4.3 Gets Reclast /zoledronic  acid injections every year.  Last one was in 09/2023  - Will repeat Reclast  in 09/2024 - Continue calcium  and vitamin D  - Will repeat DEXA scan in 2 years that is 07/2025

## 2024-06-17 NOTE — Progress Notes (Signed)
 Patient has been examined by Dr. Davonna. Vital signs and labs have been reviewed by MD - ANC, Creatinine, LFTs, hemoglobin, and platelets have been reviewed by M.D. - pt may proceed with treatment.  Primary RN and pharmacy notified.

## 2024-06-17 NOTE — Assessment & Plan Note (Signed)
 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  Will continue for a lifetime because of recurrent thrombosis also in the setting of breast carcinoma - Patient ran out of Xarelto  on Monday.  Recommended compliance with Xarelto  not missing doses.  Will send a refill today.

## 2024-06-17 NOTE — Patient Instructions (Signed)
 [facility] [place] - A DEPT OF [place]  Discharge Instructions: Thank you for choosing [facility] to provide your oncology and hematology care.  If you have a lab appointment with the Cancer Center - please note that after April 8th, 2024, all labs will be drawn in the cancer center.  You do not have to check in or register with the main entrance as you have in the past but will complete your check-in in the cancer center.  Wear comfortable clothing and clothing appropriate for easy access to any Portacath or PICC line.   We strive to give you quality time with your provider. You may need to reschedule your appointment if you arrive late (15 or more minutes).  Arriving late affects you and other patients whose appointments are after yours.  Also, if you miss three or more appointments without notifying the office, you may be dismissed from the clinic at the provider's discretion.      For prescription refill requests, have your pharmacy contact our office and allow 72 hours for refills to be completed.    Today you received the following chemotherapy and/or immunotherapy agents Kanjinti       To help prevent nausea and vomiting after your treatment, we encourage you to take your nausea medication as directed.   Trastuzumab  Injection What is this medication? TRASTUZUMAB  (tras TOO zoo mab) treats breast cancer and stomach cancer. It works by blocking a protein that causes cancer cells to grow and multiply. This helps to slow or stop the spread of cancer cells. This medicine may be used for other purposes; ask your health care provider or pharmacist if you have questions. COMMON BRAND NAME(S): Herceptin , HERCESSI, Herzuma , KANJINTI , Ogivri , Ontruzant , Trazimera  What should I tell my care team before I take this medication? They need to know if you have any of these conditions: Heart failure Lung disease An unusual or allergic reaction to trastuzumab ,  other medications, foods, dyes, or preservatives Pregnant or trying to get pregnant Breast-feeding How should I use this medication? This medication is injected into a vein. It is given by your care team in a hospital or clinic setting. Talk to your care team about the use of this medication in children. It is not approved for use in children. Overdosage: If you think you have taken too much of this medicine contact a poison control center or emergency room at once. NOTE: This medicine is only for you. Do not share this medicine with others. What if I miss a dose? Keep appointments for follow-up doses. It is important not to miss your dose. Call your care team if you are unable to keep an appointment. What may interact with this medication? Certain types of chemotherapy, such as daunorubicin, doxorubicin, epirubicin, idarubicin This list may not describe all possible interactions. Give your health care provider a list of all the medicines, herbs, non-prescription drugs, or dietary supplements you use. Also tell them if you smoke, drink alcohol, or use illegal drugs. Some items may interact with your medicine. What should I watch for while using this medication? Your condition will be monitored carefully while you are receiving this medication. This medication may make you feel generally unwell. This is not uncommon, as chemotherapy affects healthy cells as well as cancer cells. Report any side effects. Continue your course of treatment even though you feel ill unless your care team tells you to stop. This medication may increase your risk of getting  an infection. Call your care team for advice if you get a fever, chills, sore throat, or other symptoms of a cold or flu. Do not treat yourself. Try to avoid being around people who are sick. Avoid taking medications that contain aspirin , acetaminophen , ibuprofen, naproxen, or ketoprofen unless instructed by your care team. These medications can hide a  fever. Talk to your care team if you may be pregnant. Serious birth defects can occur if you take this medication during pregnancy and for 7 months after the last dose. You will need a negative pregnancy test before starting this medication. Contraception is recommended while taking this medication and for 7 months after the last dose. Your care team can help you find the option that works for you. Do not breastfeed while taking this medication and for 7 months after stopping treatment. What side effects may I notice from receiving this medication? Side effects that you should report to your care team as soon as possible: Allergic reactions or angioedema--skin rash, itching or hives, swelling of the face, eyes, lips, tongue, arms, or legs, trouble swallowing or breathing Dry cough, shortness of breath or trouble breathing Heart failure--shortness of breath, swelling of the ankles, feet, or hands, sudden weight gain, unusual weakness or fatigue Infection--fever, chills, cough, or sore throat Infusion reactions--chest pain, shortness of breath or trouble breathing, feeling faint or lightheaded Side effects that usually do not require medical attention (report to your care team if they continue or are bothersome): Diarrhea Dizziness Headache Nausea Trouble sleeping Vomiting This list may not describe all possible side effects. Call your doctor for medical advice about side effects. You may report side effects to FDA at 1-800-FDA-1088. Where should I keep my medication? This medication is given in a hospital or clinic. It will not be stored at home. NOTE: This sheet is a summary. It may not cover all possible information. If you have questions about this medicine, talk to your doctor, pharmacist, or health care provider.  2024 Elsevier/Gold Standard (2022-01-23 00:00:00) BELOW ARE SYMPTOMS THAT SHOULD BE REPORTED IMMEDIATELY: *FEVER GREATER THAN 100.4 F (38 C) OR HIGHER *CHILLS OR  SWEATING *NAUSEA AND VOMITING THAT IS NOT CONTROLLED WITH YOUR NAUSEA MEDICATION *UNUSUAL SHORTNESS OF BREATH *UNUSUAL BRUISING OR BLEEDING *URINARY PROBLEMS (pain or burning when urinating, or frequent urination) *BOWEL PROBLEMS (unusual diarrhea, constipation, pain near the anus) TENDERNESS IN MOUTH AND THROAT WITH OR WITHOUT PRESENCE OF ULCERS (sore throat, sores in mouth, or a toothache) UNUSUAL RASH, SWELLING OR PAIN  UNUSUAL VAGINAL DISCHARGE OR ITCHING   Items with * indicate a potential emergency and should be followed up as soon as possible or go to the Emergency Department if any problems should occur.  Please show the CHEMOTHERAPY ALERT CARD or IMMUNOTHERAPY ALERT CARD at check-in to the Emergency Department and triage nurse.  Should you have questions after your visit or need to cancel or reschedule your appointment, please contact [facility] [place] - A DEPT OF [facility] [phone]  and follow the prompts.  Office hours are 8:00 a.m. to 4:30 p.m. Monday - Friday. Please note that voicemails left after 4:00 p.m. may not be returned until the following business day.  We are closed weekends and major holidays. You have access to a nurse at all times for urgent questions. Please call the main number to the clinic [phone] and follow the prompts.  For any non-urgent questions, you may also contact your provider using MyChart. We now offer  e-Visits for anyone 44 and older to request care online for non-urgent symptoms. For details visit [email or web address].   Also download the MyChart app! Go to the app store, search MyChart, open the app, select [facility], and log in with your MyChart username and password.

## 2024-06-17 NOTE — Patient Instructions (Signed)

## 2024-06-17 NOTE — Assessment & Plan Note (Signed)
 Stage IIa ER positive, HER2 positive left breast carcinoma S/p lumpectomy with sentinel lymph node biopsy S/p on adjuvant chemotherapy with TCH and currently on maintenance Herceptin  S/p radiation to left breast and chest wall He is also on adjuvant anastrozole  and tolerating well  - Patient has no complaints today.  Will continue anastrozole  1 mg daily - Will continue Herceptin  every 3 weeks for a total of 52 weeks - Last mammogram was from 08/2023.  Will repeat in December 2025 - Last DEXA scan was in 07/2023 with a T-score of -4.3.  Will repeat in 2 years - Continue vitamin D  and calcium  -Last echo from 03/2024: LVEF: 65 to 70%.  Will repeat in 6 months that is 09/2024  Return to clinic for follow-up in 6 weeks.

## 2024-06-29 ENCOUNTER — Ambulatory Visit (HOSPITAL_COMMUNITY)
Admission: RE | Admit: 2024-06-29 | Discharge: 2024-06-29 | Disposition: A | Discharge status: Home / Self Care | Source: Ambulatory Visit | Attending: Hematology | Admitting: Hematology

## 2024-06-29 DIAGNOSIS — Z79899 Other long term (current) drug therapy: Secondary | ICD-10-CM | POA: Insufficient documentation

## 2024-06-29 LAB — ECHOCARDIOGRAM COMPLETE
Area-P 1/2: 2.87 cm2
S' Lateral: 1.6 cm

## 2024-06-29 NOTE — Progress Notes (Signed)
*  PRELIMINARY RESULTS* Echocardiogram 2D Echocardiogram has been performed.  Pamela Henson 06/29/2024, 3:45 PM

## 2024-07-08 ENCOUNTER — Inpatient Hospital Stay: Attending: Hematology

## 2024-07-08 ENCOUNTER — Inpatient Hospital Stay

## 2024-07-08 ENCOUNTER — Ambulatory Visit: Admitting: Oncology

## 2024-07-08 ENCOUNTER — Encounter (INDEPENDENT_AMBULATORY_CARE_PROVIDER_SITE_OTHER): Payer: Self-pay | Admitting: Gastroenterology

## 2024-07-08 VITALS — BP 124/82 | HR 62 | Temp 96.7°F | Resp 18

## 2024-07-08 DIAGNOSIS — Z1722 Progesterone receptor negative status: Secondary | ICD-10-CM | POA: Diagnosis not present

## 2024-07-08 DIAGNOSIS — Z170 Estrogen receptor positive status [ER+]: Secondary | ICD-10-CM | POA: Diagnosis not present

## 2024-07-08 DIAGNOSIS — Z5112 Encounter for antineoplastic immunotherapy: Secondary | ICD-10-CM | POA: Diagnosis not present

## 2024-07-08 DIAGNOSIS — Z7901 Long term (current) use of anticoagulants: Secondary | ICD-10-CM | POA: Diagnosis not present

## 2024-07-08 DIAGNOSIS — R232 Flushing: Secondary | ICD-10-CM | POA: Diagnosis not present

## 2024-07-08 DIAGNOSIS — M810 Age-related osteoporosis without current pathological fracture: Secondary | ICD-10-CM | POA: Diagnosis not present

## 2024-07-08 DIAGNOSIS — Z8673 Personal history of transient ischemic attack (TIA), and cerebral infarction without residual deficits: Secondary | ICD-10-CM | POA: Insufficient documentation

## 2024-07-08 DIAGNOSIS — Z1731 Human epidermal growth factor receptor 2 positive status: Secondary | ICD-10-CM | POA: Diagnosis not present

## 2024-07-08 DIAGNOSIS — Z923 Personal history of irradiation: Secondary | ICD-10-CM | POA: Insufficient documentation

## 2024-07-08 DIAGNOSIS — Z86718 Personal history of other venous thrombosis and embolism: Secondary | ICD-10-CM | POA: Diagnosis not present

## 2024-07-08 DIAGNOSIS — C50412 Malignant neoplasm of upper-outer quadrant of left female breast: Secondary | ICD-10-CM | POA: Insufficient documentation

## 2024-07-08 DIAGNOSIS — Z86711 Personal history of pulmonary embolism: Secondary | ICD-10-CM | POA: Diagnosis not present

## 2024-07-08 DIAGNOSIS — Z79811 Long term (current) use of aromatase inhibitors: Secondary | ICD-10-CM | POA: Insufficient documentation

## 2024-07-08 LAB — CBC WITH DIFFERENTIAL/PLATELET
Abs Immature Granulocytes: 0.02 K/uL (ref 0.00–0.07)
Basophils Absolute: 0 K/uL (ref 0.0–0.1)
Basophils Relative: 1 %
Eosinophils Absolute: 0.1 K/uL (ref 0.0–0.5)
Eosinophils Relative: 2 %
HCT: 42.8 % (ref 36.0–46.0)
Hemoglobin: 13.7 g/dL (ref 12.0–15.0)
Immature Granulocytes: 0 %
Lymphocytes Relative: 23 %
Lymphs Abs: 1.6 K/uL (ref 0.7–4.0)
MCH: 32.6 pg (ref 26.0–34.0)
MCHC: 32 g/dL (ref 30.0–36.0)
MCV: 101.9 fL — ABNORMAL HIGH (ref 80.0–100.0)
Monocytes Absolute: 0.5 K/uL (ref 0.1–1.0)
Monocytes Relative: 6 %
Neutro Abs: 5 K/uL (ref 1.7–7.7)
Neutrophils Relative %: 68 %
Platelets: 199 K/uL (ref 150–400)
RBC: 4.2 MIL/uL (ref 3.87–5.11)
RDW: 13.8 % (ref 11.5–15.5)
WBC: 7.3 K/uL (ref 4.0–10.5)
nRBC: 0 % (ref 0.0–0.2)

## 2024-07-08 LAB — MAGNESIUM: Magnesium: 2.3 mg/dL (ref 1.7–2.4)

## 2024-07-08 LAB — COMPREHENSIVE METABOLIC PANEL WITH GFR
ALT: <5 U/L (ref 0–44)
AST: 26 U/L (ref 15–41)
Albumin: 4 g/dL (ref 3.5–5.0)
Alkaline Phosphatase: 87 U/L (ref 38–126)
Anion gap: 11 (ref 5–15)
BUN: 14 mg/dL (ref 8–23)
CO2: 24 mmol/L (ref 22–32)
Calcium: 9.2 mg/dL (ref 8.9–10.3)
Chloride: 107 mmol/L (ref 98–111)
Creatinine, Ser: 0.68 mg/dL (ref 0.44–1.00)
GFR, Estimated: >60 mL/min (ref >60)
Glucose, Bld: 131 mg/dL — ABNORMAL HIGH (ref 70–99)
Potassium: 4.2 mmol/L (ref 3.5–5.1)
Sodium: 142 mmol/L (ref 135–145)
Total Bilirubin: 0.2 mg/dL (ref 0.0–1.2)
Total Protein: 6.4 g/dL — ABNORMAL LOW (ref 6.5–8.1)

## 2024-07-08 MED ORDER — SODIUM CHLORIDE 0.9 % IV SOLN: INTRAVENOUS | Status: DC

## 2024-07-08 MED ORDER — ACETAMINOPHEN 325 MG PO TABS: 650.0000 mg | ORAL_TABLET | Freq: Once | ORAL | Status: DC

## 2024-07-08 MED ORDER — DIPHENHYDRAMINE HCL 25 MG PO CAPS: 25.0000 mg | ORAL_CAPSULE | Freq: Once | ORAL | Status: DC

## 2024-07-08 MED ORDER — TRASTUZUMAB-ANNS CHEMO 150 MG IV SOLR
6.0000 mg/kg | Freq: Once | INTRAVENOUS | Status: AC
  Administered 2024-07-08: 300 mg via INTRAVENOUS
  Filled 2024-07-08: qty 14.29

## 2024-07-08 NOTE — Progress Notes (Signed)
 Patient presents today for Kanjinti  infusion.  Vital signs and lab work within parameters for treatment.   Treatment given today per MD orders. Tolerated infusion without adverse affects. Vital signs stable. No complaints at this time. Discharged from clinic ambulatory in stable condition. Alert and oriented x 3. F/U with [facility] as scheduled.

## 2024-07-08 NOTE — Patient Instructions (Signed)
 [facility] [place] - A DEPT OF [facility]  Discharge Instructions: Thank you for choosing [facility] to provide your oncology and hematology care.  If you have a lab appointment with the Cancer Center - please note that after April 8th, 2024, all labs will be drawn in the cancer center.  You do not have to check in or register with the main entrance as you have in the past but will complete your check-in in the cancer center.  Wear comfortable clothing and clothing appropriate for easy access to any Portacath or PICC line.   We strive to give you quality time with your provider. You may need to reschedule your appointment if you arrive late (15 or more minutes).  Arriving late affects you and other patients whose appointments are after yours.  Also, if you miss three or more appointments without notifying the office, you may be dismissed from the clinic at the provider's discretion.      For prescription refill requests, have your pharmacy contact our office and allow 72 hours for refills to be completed.    Today you received the following chemotherapy and/or immunotherapy agents Kanjinti .  Trastuzumab  Injection What is this medication? TRASTUZUMAB  (tras TOO zoo mab) treats breast cancer and stomach cancer. It works by blocking a protein that causes cancer cells to grow and multiply. This helps to slow or stop the spread of cancer cells. This medicine may be used for other purposes; ask your health care provider or pharmacist if you have questions. COMMON BRAND NAME(S): Herceptin , HERCESSI, Herzuma , KANJINTI , Ogivri , Ontruzant , Trazimera  What should I tell my care team before I take this medication? They need to know if you have any of these conditions: Heart failure Lung disease An unusual or allergic reaction to trastuzumab , other medications, foods, dyes, or preservatives Pregnant or trying to get pregnant Breast-feeding How should I use this  medication? This medication is injected into a vein. It is given by your care team in a hospital or clinic setting. Talk to your care team about the use of this medication in children. It is not approved for use in children. Overdosage: If you think you have taken too much of this medicine contact a poison control center or emergency room at once. NOTE: This medicine is only for you. Do not share this medicine with others. What if I miss a dose? Keep appointments for follow-up doses. It is important not to miss your dose. Call your care team if you are unable to keep an appointment. What may interact with this medication? Certain types of chemotherapy, such as daunorubicin, doxorubicin, epirubicin, idarubicin This list may not describe all possible interactions. Give your health care provider a list of all the medicines, herbs, non-prescription drugs, or dietary supplements you use. Also tell them if you smoke, drink alcohol, or use illegal drugs. Some items may interact with your medicine. What should I watch for while using this medication? Your condition will be monitored carefully while you are receiving this medication. This medication may make you feel generally unwell. This is not uncommon, as chemotherapy affects healthy cells as well as cancer cells. Report any side effects. Continue your course of treatment even though you feel ill unless your care team tells you to stop. This medication may increase your risk of getting an infection. Call your care team for advice if you get a fever, chills, sore throat, or other symptoms of a cold or flu. Do  not treat yourself. Try to avoid being around people who are sick. Avoid taking medications that contain aspirin, acetaminophen , ibuprofen , naproxen , or ketoprofen unless instructed by your care team. These medications can hide a fever. Talk to your care team if you may be pregnant. Serious birth defects can occur if you take this medication during  pregnancy and for 7 months after the last dose. You will need a negative pregnancy test before starting this medication. Contraception is recommended while taking this medication and for 7 months after the last dose. Your care team can help you find the option that works for you. Do not breastfeed while taking this medication and for 7 months after stopping treatment. What side effects may I notice from receiving this medication? Side effects that you should report to your care team as soon as possible: Allergic reactions or angioedema--skin rash, itching or hives, swelling of the face, eyes, lips, tongue, arms, or legs, trouble swallowing or breathing Dry cough, shortness of breath or trouble breathing Heart failure--shortness of breath, swelling of the ankles, feet, or hands, sudden weight gain, unusual weakness or fatigue Infection--fever, chills, cough, or sore throat Infusion reactions--chest pain, shortness of breath or trouble breathing, feeling faint or lightheaded Side effects that usually do not require medical attention (report to your care team if they continue or are bothersome): Diarrhea Dizziness Headache Nausea Trouble sleeping Vomiting This list may not describe all possible side effects. Call your doctor for medical advice about side effects. You may report side effects to FDA at 1-800-FDA-1088. Where should I keep my medication? This medication is given in a hospital or clinic. It will not be stored at home. NOTE: This sheet is a summary. It may not cover all possible information. If you have questions about this medicine, talk to your doctor, pharmacist, or health care provider.  2024 Elsevier/Gold Standard (2022-01-23 00:00:00)       To help prevent nausea and vomiting after your treatment, we encourage you to take your nausea medication as directed.  BELOW ARE SYMPTOMS THAT SHOULD BE REPORTED IMMEDIATELY: *FEVER GREATER THAN 100.4 F (38 C) OR HIGHER *CHILLS OR  SWEATING *NAUSEA AND VOMITING THAT IS NOT CONTROLLED WITH YOUR NAUSEA MEDICATION *UNUSUAL SHORTNESS OF BREATH *UNUSUAL BRUISING OR BLEEDING *URINARY PROBLEMS (pain or burning when urinating, or frequent urination) *BOWEL PROBLEMS (unusual diarrhea, constipation, pain near the anus) TENDERNESS IN MOUTH AND THROAT WITH OR WITHOUT PRESENCE OF ULCERS (sore throat, sores in mouth, or a toothache) UNUSUAL RASH, SWELLING OR PAIN  UNUSUAL VAGINAL DISCHARGE OR ITCHING   Items with * indicate a potential emergency and should be followed up as soon as possible or go to the Emergency Department if any problems should occur.  Please show the CHEMOTHERAPY ALERT CARD or IMMUNOTHERAPY ALERT CARD at check-in to the Emergency Department and triage nurse.  Should you have questions after your visit or need to cancel or reschedule your appointment, please contact [facility] [place] - A DEPT OF [facility] [phone]  and follow the prompts.  Office hours are 8:00 a.m. to 4:30 p.m. Monday - Friday. Please note that voicemails left after 4:00 p.m. may not be returned until the following business day.  We are closed weekends and major holidays. You have access to a nurse at all times for urgent questions. Please call the main number to the clinic [phone] and follow the prompts.  For any non-urgent questions, you may also contact your provider using MyChart. We now  offer e-Visits for anyone 17 and older to request care online for non-urgent symptoms. For details visit [email or web address].   Also download the MyChart app! Go to the app store, search MyChart, open the app, select [facility], and log in with your MyChart username and password.

## 2024-07-09 DIAGNOSIS — F411 Generalized anxiety disorder: Secondary | ICD-10-CM | POA: Diagnosis not present

## 2024-07-09 DIAGNOSIS — F410 Panic disorder [episodic paroxysmal anxiety] without agoraphobia: Secondary | ICD-10-CM | POA: Diagnosis not present

## 2024-07-09 DIAGNOSIS — F331 Major depressive disorder, recurrent, moderate: Secondary | ICD-10-CM | POA: Diagnosis not present

## 2024-07-29 ENCOUNTER — Inpatient Hospital Stay: Admitting: Oncology

## 2024-07-29 ENCOUNTER — Inpatient Hospital Stay

## 2024-07-29 ENCOUNTER — Other Ambulatory Visit (HOSPITAL_COMMUNITY): Payer: Self-pay | Admitting: Oncology

## 2024-07-29 ENCOUNTER — Inpatient Hospital Stay: Attending: Oncology

## 2024-07-29 VITALS — BP 110/63 | HR 67 | Temp 96.4°F | Resp 18

## 2024-07-29 VITALS — BP 113/76 | HR 87 | Temp 96.4°F | Resp 18 | Wt 107.4 lb

## 2024-07-29 DIAGNOSIS — Z79811 Long term (current) use of aromatase inhibitors: Secondary | ICD-10-CM | POA: Insufficient documentation

## 2024-07-29 DIAGNOSIS — Z86711 Personal history of pulmonary embolism: Secondary | ICD-10-CM | POA: Insufficient documentation

## 2024-07-29 DIAGNOSIS — C50412 Malignant neoplasm of upper-outer quadrant of left female breast: Secondary | ICD-10-CM

## 2024-07-29 DIAGNOSIS — M810 Age-related osteoporosis without current pathological fracture: Secondary | ICD-10-CM | POA: Insufficient documentation

## 2024-07-29 DIAGNOSIS — Z7901 Long term (current) use of anticoagulants: Secondary | ICD-10-CM | POA: Diagnosis not present

## 2024-07-29 DIAGNOSIS — I8290 Acute embolism and thrombosis of unspecified vein: Secondary | ICD-10-CM

## 2024-07-29 DIAGNOSIS — Z170 Estrogen receptor positive status [ER+]: Secondary | ICD-10-CM | POA: Insufficient documentation

## 2024-07-29 DIAGNOSIS — Z1722 Progesterone receptor negative status: Secondary | ICD-10-CM | POA: Diagnosis not present

## 2024-07-29 DIAGNOSIS — Z1731 Human epidermal growth factor receptor 2 positive status: Secondary | ICD-10-CM | POA: Insufficient documentation

## 2024-07-29 DIAGNOSIS — Z853 Personal history of malignant neoplasm of breast: Secondary | ICD-10-CM

## 2024-07-29 DIAGNOSIS — Z86718 Personal history of other venous thrombosis and embolism: Secondary | ICD-10-CM | POA: Diagnosis not present

## 2024-07-29 DIAGNOSIS — Z5112 Encounter for antineoplastic immunotherapy: Secondary | ICD-10-CM | POA: Diagnosis not present

## 2024-07-29 DIAGNOSIS — Z8673 Personal history of transient ischemic attack (TIA), and cerebral infarction without residual deficits: Secondary | ICD-10-CM | POA: Insufficient documentation

## 2024-07-29 DIAGNOSIS — Z923 Personal history of irradiation: Secondary | ICD-10-CM | POA: Insufficient documentation

## 2024-07-29 DIAGNOSIS — Z79899 Other long term (current) drug therapy: Secondary | ICD-10-CM

## 2024-07-29 LAB — CBC WITH DIFFERENTIAL/PLATELET
Abs Immature Granulocytes: 0.01 K/uL (ref 0.00–0.07)
Basophils Absolute: 0.1 K/uL (ref 0.0–0.1)
Basophils Relative: 1 %
Eosinophils Absolute: 0.1 K/uL (ref 0.0–0.5)
Eosinophils Relative: 2 %
HCT: 42.7 % (ref 36.0–46.0)
Hemoglobin: 13.7 g/dL (ref 12.0–15.0)
Immature Granulocytes: 0 %
Lymphocytes Relative: 23 %
Lymphs Abs: 1.5 K/uL (ref 0.7–4.0)
MCH: 32.5 pg (ref 26.0–34.0)
MCHC: 32.1 g/dL (ref 30.0–36.0)
MCV: 101.4 fL — ABNORMAL HIGH (ref 80.0–100.0)
Monocytes Absolute: 0.4 K/uL (ref 0.1–1.0)
Monocytes Relative: 7 %
Neutro Abs: 4.5 K/uL (ref 1.7–7.7)
Neutrophils Relative %: 67 %
Platelets: 210 K/uL (ref 150–400)
RBC: 4.21 MIL/uL (ref 3.87–5.11)
RDW: 13.5 % (ref 11.5–15.5)
WBC: 6.6 K/uL (ref 4.0–10.5)
nRBC: 0 % (ref 0.0–0.2)

## 2024-07-29 LAB — COMPREHENSIVE METABOLIC PANEL WITH GFR
ALT: 5 U/L (ref 0–44)
AST: 21 U/L (ref 15–41)
Albumin: 3.9 g/dL (ref 3.5–5.0)
Alkaline Phosphatase: 76 U/L (ref 38–126)
Anion gap: 10 (ref 5–15)
BUN: 18 mg/dL (ref 8–23)
CO2: 26 mmol/L (ref 22–32)
Calcium: 9 mg/dL (ref 8.9–10.3)
Chloride: 106 mmol/L (ref 98–111)
Creatinine, Ser: 0.6 mg/dL (ref 0.44–1.00)
GFR, Estimated: >60 mL/min (ref >60)
Glucose, Bld: 132 mg/dL — ABNORMAL HIGH (ref 70–99)
Potassium: 4.1 mmol/L (ref 3.5–5.1)
Sodium: 142 mmol/L (ref 135–145)
Total Bilirubin: 0.3 mg/dL (ref 0.0–1.2)
Total Protein: 6.4 g/dL — ABNORMAL LOW (ref 6.5–8.1)

## 2024-07-29 LAB — MAGNESIUM: Magnesium: 2.3 mg/dL (ref 1.7–2.4)

## 2024-07-29 MED ORDER — SODIUM CHLORIDE 0.9 % IV SOLN: INTRAVENOUS | Status: DC

## 2024-07-29 MED ORDER — TRASTUZUMAB-ANNS CHEMO 150 MG IV SOLR
6.0000 mg/kg | Freq: Once | INTRAVENOUS | Status: AC
  Administered 2024-07-29: 300 mg via INTRAVENOUS
  Filled 2024-07-29: qty 14.29

## 2024-07-29 NOTE — Progress Notes (Signed)
 Patient Care Team: Cook, Jayce G, DO as PCP - General (Family Medicine) Blinda Ferry, MD as Consulting Physician (Psychiatry) Joshua Pao, NP as Nurse Practitioner (Psychiatry) Celestia Joesph SQUIBB, RN as Oncology Nurse Navigator (Medical Oncology) Lenis Ethelle ORN, MD as Consulting Physician (Endocrinology) Lamon Pleasant CHRISTELLA DEVONNA as Physician Assistant (Oncology) Dannielle Alm BRAVO, MD as Referring Physician (Radiation Oncology) [facility], [facility]   Clinic Day:  07/29/2024  Referring physician: Bluford Jacqulyn MATSU, DO   CHIEF COMPLAINT:  CC: Stage IIa (T2 N0 M0 G2 ER positive, PR negative,HER2+) left breast UOQ IDC   ASSESSMENT & PLAN:   Assessment & Plan: Pamela Henson  is a 73 y.o. 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  Will continue anastrozole  1 mg daily - Will continue Herceptin  every 3 weeks for a total of 52 weeks - Last mammogram was from 08/2023.  Will repeat in December 2025.  Scheduled for 09/01/2024 - Last DEXA scan was in 07/2023 with a T-score of -4.3.  Will repeat in 2 years - Continue vitamin D  and calcium  - Last echo from 03/2024: LVEF: 65 to 70%.  Will repeat in 6 months that is 09/2024.  Scheduled for 09/25/2024   Return to clinic for follow-up in 6 weeks.  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  Will continue for a  lifetime because of recurrent thrombosis also in the setting of breast carcinoma  Osteoporosis Last DEXA scan from 07/2023 with a T-score of -4.3 Gets Reclast /zoledronic  acid injections every year.  Last one was in 09/2023   - Will administer Reclast  in 09/2024 - Continue calcium  and vitamin D  - Will repeat DEXA scan in 2 years that is 07/2025   The patient understands the plans discussed today and is in agreement with them.  She knows to contact our office if she develops concerns prior to her next appointment.  The total time spent in the appointment was 20 minutes for the encounter  with patient, including review of chart and various tests results, discussions about plan of care and coordination of care plan   Mickiel Dry, MD  [facility] [place] - A DEPT OF [facility] [address] [place] [address] Dept: [phone] Dept Fax: [phone]   Orders Placed This Encounter  Procedures   ECHOCARDIOGRAM COMPLETE    Standing Status:   Future    Expected Date:   08/28/2024    Expiration Date:   07/29/2025    Where should this test be performed:   [facility]    Perflutren DEFINITY (image enhancing agent) should be administered unless hypersensitivity or allergy  exist:   Administer Perflutren    Reason for exam-Echo:   Chemo  Z09     ONCOLOGY HISTORY:   I have reviewed her chart and materials related to her cancer extensively and collaborated history with the patient. Summary  of oncologic history is as follows:   Diagnosis: Stage IIa (T2 N0 M0 G2 ER positive, PR negative,HER2+) left breast UOQ IDC   -Presented with a palpable lump in left breast of October 2024 -08/27/2023: Bilateral diagnostic mammogram and US : Suspicious palpable 2.1 cm mass in the left breast the 3 o'clock position. -08/30/2023: Left breast core biopsy.  Pathology: Invasive ductal carcinoma, 1.7 cm. DCIS solid and cribriform types,  intermediate nuclear grade. No lymphovascular invasion identified. No calcifications identified. Tumor cells are ER positive (95%), Her2 positive (3+), and PR negative. Ki-67 is 5%.  -09/06/2023: CT chest: No new or progressive interval findings to suggest metastatic disease in the chest. -09/26/2023: Left breast lumpectomy and SNLB.  Pathology: Invasive ductal carcinoma, 2.4 cm, grade 2. Ductal carcinoma in situ, intermediate-grade. Resection margins are negative for carcinoma. Negative for lymphovascular or perineural invasion. Three lymph nodes, negative for carcinoma (0/3). Pathologic staging: pT2, pN0  -11/04/2023-02/18/2024: 6 cycles of TCH  -02/19/2024-current: Maintenance Herceptin  every 3 weeks -03/31/2024-current: Anastrozole  1mg  daily -05/01/2024-05/06/2024: Radiation therapy to left breast and chest wall  Current Treatment: Maintenance Herceptin  plus anastrozole   INTERVAL HISTORY:   Discussed the use of AI scribe software for clinical note transcription with the patient, who gave verbal consent to proceed.  History of Present Illness Pamela Henson is a 73 year old female who presents for a follow-up visit for her breast cancer.  She experiences occasional discomfort at the site of her port, describing it as 'uncomfortable.'  She continues anastrozole  therapy without experiencing side effects such as hot flashes or joint pains. No breathing difficulties or shortness of breath are reported.  She is on Xarelto  for venous thromboembolism. She is also taking calcium  and vitamin D  supplements as part of her treatment regimen.  Her last mammogram was in December of the previous year and will repeat it next month.  No hot flashes, joint pains, breathing difficulties, shortness of breath, or chills.   I have reviewed the past medical history, past surgical history, social history and family history with the patient and they are unchanged from previous note.  ALLERGIES:  is  allergic to asa [aspirin], eliquis [apixaban], and oxycodone .  MEDICATIONS:  Current Outpatient Medications  Medication Sig Dispense Refill   acetaminophen  (TYLENOL ) 500 MG tablet Take 500 mg by mouth every 6 (six) hours as needed for mild pain, moderate pain or headache.     anastrozole  (ARIMIDEX ) 1 MG tablet Take 1 tablet (1 mg total) by mouth daily. 30 tablet 5   calcium -vitamin D  (OSCAL WITH D) 500-5 MG-MCG tablet Take 1 tablet by mouth 2 (two) times daily with a meal. 180 tablet 1   Cholecalciferol  (VITAMIN D ) 50 MCG (2000 UT) tablet Take 2,000 Units by mouth daily.     gabapentin  (NEURONTIN ) 100 MG capsule Take 100 mg by mouth 3 (three) times daily.     lidocaine -prilocaine  (EMLA ) cream Apply to affected area once 30 g 3   melatonin 5 MG TABS Take 5 mg by mouth at bedtime as needed (sleep).     Melatonin-Pyridoxine 5-1 MG TABS Take 5 mg by mouth.     Multiple Vitamins-Minerals (MULTIVITAMIN WITH MINERALS) tablet Take 1 tablet by mouth daily.     rivaroxaban  (XARELTO ) 20 MG TABS tablet Take 1 tablet (20 mg total) by mouth daily with supper. 30 tablet 12   rosuvastatin  (CRESTOR ) 5 MG tablet Take 1 tablet (5 mg total) by mouth daily. 90 tablet 3   sertraline  (ZOLOFT ) 50 MG tablet Take  50 mg by mouth at bedtime. (Patient taking differently: Take 50 mg by mouth at bedtime. Take one and a half tablets a day.)     tamsulosin  (FLOMAX ) 0.4 MG CAPS capsule Take 1 capsule (0.4 mg total) by mouth at bedtime. 90 capsule 3   Trastuzumab -anns (KANJINTI  IV) Inject into the vein.     No current facility-administered medications for this visit.   Facility-Administered Medications Ordered in Other Visits  Medication Dose Route Frequency Provider Last Rate Last Admin   0.9 %  sodium chloride  infusion   Intravenous Continuous Delayza Lungren, MD   Stopped at 07/29/24 1240    REVIEW OF SYSTEMS:   Constitutional: Denies fevers, chills or abnormal weight loss Eyes: Denies blurriness of vision Ears,  nose, mouth, throat, and face: Denies mucositis or sore throat Respiratory: Denies cough, dyspnea or wheezes Cardiovascular: Denies palpitation, chest discomfort or lower extremity swelling Gastrointestinal:  Denies nausea, heartburn or change in bowel habits Skin: Denies abnormal skin rashes Lymphatics: Denies new lymphadenopathy or easy bruising Neurological:Denies numbness, tingling or new weaknesses Behavioral/Psych: Mood is stable, no new changes  All other systems were reviewed with the patient and are negative.   VITALS:  There were no vitals taken for this visit.  Wt Readings from Last 3 Encounters:  07/29/24 107 lb 5.8 oz (48.7 kg)  07/08/24 106 lb 11.2 oz (48.4 kg)  06/17/24 107 lb 9.6 oz (48.8 kg)    There is no height or weight on file to calculate BMI.  Performance status (ECOG): 0 - Asymptomatic  PHYSICAL EXAM:   GENERAL:alert, no distress and comfortable SKIN: skin color, texture, turgor are normal, no rashes or significant lesions LYMPH:  no palpable lymphadenopathy in the cervical, axillary or inguinal LUNGS: clear to auscultation and percussion with normal breathing effort HEART: regular rate & rhythm and no murmurs and no lower extremity edema ABDOMEN:abdomen soft, non-tender and normal bowel sounds Musculoskeletal:no cyanosis of digits and no clubbing  NEURO: alert & oriented x 3 with fluent speech  LABORATORY DATA:  I have reviewed the data as listed   Lab Results  Component Value Date   WBC 6.6 07/29/2024   NEUTROABS 4.5 07/29/2024   HGB 13.7 07/29/2024   HCT 42.7 07/29/2024   MCV 101.4 (H) 07/29/2024   PLT 210 07/29/2024      Chemistry      Component Value Date/Time   NA 142 07/29/2024 0920   NA 147 (H) 08/20/2023 1122   K 4.1 07/29/2024 0920   CL 106 07/29/2024 0920   CO2 26 07/29/2024 0920   BUN 18 07/29/2024 0920   BUN 12 08/20/2023 1122   CREATININE 0.60 07/29/2024 0920      Component Value Date/Time   CALCIUM  9.0 07/29/2024 0920    ALKPHOS 76 07/29/2024 0920   AST 21 07/29/2024 0920   ALT 5 07/29/2024 0920   BILITOT 0.3 07/29/2024 0920   BILITOT <0.2 08/20/2023 1122       RADIOGRAPHIC STUDIES: I have personally reviewed the radiological images as listed and agreed with the findings in the report.

## 2024-07-29 NOTE — Patient Instructions (Signed)

## 2024-07-29 NOTE — Patient Instructions (Signed)
 [facility] [place] - A DEPT OF [facility]  Discharge Instructions: Thank you for choosing [facility] to provide your oncology and hematology care.  If you have a lab appointment with the Cancer Center - please note that after April 8th, 2024, all labs will be drawn in the cancer center.  You do not have to check in or register with the main entrance as you have in the past but will complete your check-in in the cancer center.  Wear comfortable clothing and clothing appropriate for easy access to any Portacath or PICC line.   We strive to give you quality time with your provider. You may need to reschedule your appointment if you arrive late (15 or more minutes).  Arriving late affects you and other patients whose appointments are after yours.  Also, if you miss three or more appointments without notifying the office, you may be dismissed from the clinic at the provider's discretion.      For prescription refill requests, have your pharmacy contact our office and allow 72 hours for refills to be completed.    Today you received the following chemotherapy and/or immunotherapy agents Kanjinti, return as scheduled.   To help prevent nausea and vomiting after your treatment, we encourage you to take your nausea medication as directed.  BELOW ARE SYMPTOMS THAT SHOULD BE REPORTED IMMEDIATELY: *FEVER GREATER THAN 100.4 F (38 C) OR HIGHER *CHILLS OR SWEATING *NAUSEA AND VOMITING THAT IS NOT CONTROLLED WITH YOUR NAUSEA MEDICATION *UNUSUAL SHORTNESS OF BREATH *UNUSUAL BRUISING OR BLEEDING *URINARY PROBLEMS (pain or burning when urinating, or frequent urination) *BOWEL PROBLEMS (unusual diarrhea, constipation, pain near the anus) TENDERNESS IN MOUTH AND THROAT WITH OR WITHOUT PRESENCE OF ULCERS (sore throat, sores in mouth, or a toothache) UNUSUAL RASH, SWELLING OR PAIN  UNUSUAL VAGINAL DISCHARGE OR ITCHING   Items with * indicate a potential emergency and  should be followed up as soon as possible or go to the Emergency Department if any problems should occur.  Please show the CHEMOTHERAPY ALERT CARD or IMMUNOTHERAPY ALERT CARD at check-in to the Emergency Department and triage nurse.  Should you have questions after your visit or need to cancel or reschedule your appointment, please contact [facility] [place] - A DEPT OF [facility] [phone]  and follow the prompts.  Office hours are 8:00 a.m. to 4:30 p.m. Monday - Friday. Please note that voicemails left after 4:00 p.m. may not be returned until the following business day.  We are closed weekends and major holidays. You have access to a nurse at all times for urgent questions. Please call the main number to the clinic [phone] and follow the prompts.  For any non-urgent questions, you may also contact your provider using MyChart. We now offer e-Visits for anyone 91 and older to request care online for non-urgent symptoms. For details visit [email or web address].   Also download the MyChart app! Go to the app store, search "MyChart", open the app, select [place], and log in with your MyChart username and password.

## 2024-07-29 NOTE — Progress Notes (Signed)
 Patient has been examined by Dr. Davonna. Vital signs and labs have been reviewed by MD - ANC, Creatinine, LFTs, hemoglobin, and platelets have been reviewed by M.D. - pt may proceed with treatment.  Primary RN and pharmacy notified.

## 2024-07-29 NOTE — Progress Notes (Signed)
 Patient okay for treatment per Dr. Davonna. Patient reports taking pre-medications at home. Patient tolerated therapy with no complaints voiced. Side effects with management reviewed with understanding verbalized. Port site clean and dry with no bruising or swelling noted at site. Good blood return noted before and after administration of therapy. Band aid applied. Patient left in satisfactory condition with VSS and no s/s of distress noted.

## 2024-08-19 ENCOUNTER — Inpatient Hospital Stay

## 2024-08-19 VITALS — BP 107/69 | HR 80 | Temp 97.8°F | Resp 18

## 2024-08-19 DIAGNOSIS — C50412 Malignant neoplasm of upper-outer quadrant of left female breast: Secondary | ICD-10-CM

## 2024-08-19 DIAGNOSIS — M810 Age-related osteoporosis without current pathological fracture: Secondary | ICD-10-CM | POA: Diagnosis not present

## 2024-08-19 DIAGNOSIS — Z7901 Long term (current) use of anticoagulants: Secondary | ICD-10-CM | POA: Diagnosis not present

## 2024-08-19 DIAGNOSIS — Z79811 Long term (current) use of aromatase inhibitors: Secondary | ICD-10-CM | POA: Diagnosis not present

## 2024-08-19 DIAGNOSIS — Z170 Estrogen receptor positive status [ER+]: Secondary | ICD-10-CM | POA: Diagnosis not present

## 2024-08-19 DIAGNOSIS — Z86711 Personal history of pulmonary embolism: Secondary | ICD-10-CM | POA: Diagnosis not present

## 2024-08-19 DIAGNOSIS — Z8673 Personal history of transient ischemic attack (TIA), and cerebral infarction without residual deficits: Secondary | ICD-10-CM | POA: Diagnosis not present

## 2024-08-19 DIAGNOSIS — Z1722 Progesterone receptor negative status: Secondary | ICD-10-CM | POA: Diagnosis not present

## 2024-08-19 DIAGNOSIS — Z5112 Encounter for antineoplastic immunotherapy: Secondary | ICD-10-CM | POA: Diagnosis not present

## 2024-08-19 DIAGNOSIS — Z1731 Human epidermal growth factor receptor 2 positive status: Secondary | ICD-10-CM | POA: Diagnosis not present

## 2024-08-19 DIAGNOSIS — Z923 Personal history of irradiation: Secondary | ICD-10-CM | POA: Diagnosis not present

## 2024-08-19 DIAGNOSIS — Z86718 Personal history of other venous thrombosis and embolism: Secondary | ICD-10-CM | POA: Diagnosis not present

## 2024-08-19 LAB — CBC WITH DIFFERENTIAL/PLATELET
Abs Immature Granulocytes: 0.02 K/uL (ref 0.00–0.07)
Basophils Absolute: 0.1 K/uL (ref 0.0–0.1)
Basophils Relative: 1 %
Eosinophils Absolute: 0.1 K/uL (ref 0.0–0.5)
Eosinophils Relative: 2 %
HCT: 42 % (ref 36.0–46.0)
Hemoglobin: 13.5 g/dL (ref 12.0–15.0)
Immature Granulocytes: 0 %
Lymphocytes Relative: 20 %
Lymphs Abs: 1.3 K/uL (ref 0.7–4.0)
MCH: 32.7 pg (ref 26.0–34.0)
MCHC: 32.1 g/dL (ref 30.0–36.0)
MCV: 101.7 fL — ABNORMAL HIGH (ref 80.0–100.0)
Monocytes Absolute: 0.3 K/uL (ref 0.1–1.0)
Monocytes Relative: 5 %
Neutro Abs: 4.6 K/uL (ref 1.7–7.7)
Neutrophils Relative %: 72 %
Platelets: 195 K/uL (ref 150–400)
RBC: 4.13 MIL/uL (ref 3.87–5.11)
RDW: 13.5 % (ref 11.5–15.5)
WBC: 6.4 K/uL (ref 4.0–10.5)
nRBC: 0 % (ref 0.0–0.2)

## 2024-08-19 LAB — COMPREHENSIVE METABOLIC PANEL WITH GFR
ALT: 7 U/L (ref 0–44)
AST: 22 U/L (ref 15–41)
Albumin: 3.9 g/dL (ref 3.5–5.0)
Alkaline Phosphatase: 76 U/L (ref 38–126)
Anion gap: 9 (ref 5–15)
BUN: 19 mg/dL (ref 8–23)
CO2: 26 mmol/L (ref 22–32)
Calcium: 9 mg/dL (ref 8.9–10.3)
Chloride: 105 mmol/L (ref 98–111)
Creatinine, Ser: 0.66 mg/dL (ref 0.44–1.00)
GFR, Estimated: >60 mL/min (ref >60)
Glucose, Bld: 225 mg/dL — ABNORMAL HIGH (ref 70–99)
Potassium: 3.9 mmol/L (ref 3.5–5.1)
Sodium: 140 mmol/L (ref 135–145)
Total Bilirubin: 0.2 mg/dL (ref 0.0–1.2)
Total Protein: 6.2 g/dL — ABNORMAL LOW (ref 6.5–8.1)

## 2024-08-19 LAB — MAGNESIUM: Magnesium: 2.4 mg/dL (ref 1.7–2.4)

## 2024-08-19 MED ORDER — SODIUM CHLORIDE 0.9 % IV SOLN: INTRAVENOUS | Status: DC

## 2024-08-19 MED ORDER — TRASTUZUMAB-ANNS CHEMO 420 MG IV SOLR
6.0000 mg/kg | Freq: Once | INTRAVENOUS | Status: AC
  Administered 2024-08-19: 300 mg via INTRAVENOUS
  Filled 2024-08-19: qty 14.29

## 2024-08-19 NOTE — Patient Instructions (Signed)
 [facility] [place] - A DEPT OF [facility]  Discharge Instructions: Thank you for choosing [facility] to provide your oncology and hematology care.  If you have a lab appointment with the Cancer Center - please note that after April 8th, 2024, all labs will be drawn in the cancer center.  You do not have to check in or register with the main entrance as you have in the past but will complete your check-in in the cancer center.  Wear comfortable clothing and clothing appropriate for easy access to any Portacath or PICC line.   We strive to give you quality time with your provider. You may need to reschedule your appointment if you arrive late (15 or more minutes).  Arriving late affects you and other patients whose appointments are after yours.  Also, if you miss three or more appointments without notifying the office, you may be dismissed from the clinic at the provider's discretion.      For prescription refill requests, have your pharmacy contact our office and allow 72 hours for refills to be completed.    Today you received the following chemotherapy and/or immunotherapy agents kanjinti      To help prevent nausea and vomiting after your treatment, we encourage you to take your nausea medication as directed.  BELOW ARE SYMPTOMS THAT SHOULD BE REPORTED IMMEDIATELY: *FEVER GREATER THAN 100.4 F (38 C) OR HIGHER *CHILLS OR SWEATING *NAUSEA AND VOMITING THAT IS NOT CONTROLLED WITH YOUR NAUSEA MEDICATION *UNUSUAL SHORTNESS OF BREATH *UNUSUAL BRUISING OR BLEEDING *URINARY PROBLEMS (pain or burning when urinating, or frequent urination) *BOWEL PROBLEMS (unusual diarrhea, constipation, pain near the anus) TENDERNESS IN MOUTH AND THROAT WITH OR WITHOUT PRESENCE OF ULCERS (sore throat, sores in mouth, or a toothache) UNUSUAL RASH, SWELLING OR PAIN  UNUSUAL VAGINAL DISCHARGE OR ITCHING   Items with * indicate a potential emergency and should be followed up  as soon as possible or go to the Emergency Department if any problems should occur.  Please show the CHEMOTHERAPY ALERT CARD or IMMUNOTHERAPY ALERT CARD at check-in to the Emergency Department and triage nurse.  Should you have questions after your visit or need to cancel or reschedule your appointment, please contact [facility]  - A DEPT OF [facility] [phone]  and follow the prompts.  Office hours are 8:00 a.m. to 4:30 p.m. Monday - Friday. Please note that voicemails left after 4:00 p.m. may not be returned until the following business day.  We are closed weekends and major holidays. You have access to a nurse at all times for urgent questions. Please call the main number to the clinic [phone] and follow the prompts.  For any non-urgent questions, you may also contact your provider using MyChart. We now offer e-Visits for anyone 28 and older to request care online for non-urgent symptoms. For details visit [email or web address].   Also download the MyChart app! Go to the app store, search "MyChart", open the app, select [facility], and log in with your MyChart username and password.

## 2024-08-19 NOTE — Progress Notes (Signed)
 Patient took own premeds from home.  Patient tolerated chemotherapy with no complaints voiced.  Side effects with management reviewed with understanding verbalized.  Port site clean and dry with no bruising or swelling noted at site.  Good blood return noted before and after administration of chemotherapy.  Band aid applied.  Patient left in satisfactory condition with VSS and no s/s of distress noted.

## 2024-09-01 ENCOUNTER — Ambulatory Visit (HOSPITAL_COMMUNITY)
Admission: RE | Admit: 2024-09-01 | Discharge: 2024-09-01 | Disposition: A | Source: Ambulatory Visit | Attending: Oncology

## 2024-09-01 ENCOUNTER — Encounter (HOSPITAL_COMMUNITY): Payer: Self-pay

## 2024-09-01 ENCOUNTER — Inpatient Hospital Stay

## 2024-09-01 DIAGNOSIS — Z170 Estrogen receptor positive status [ER+]: Secondary | ICD-10-CM

## 2024-09-01 DIAGNOSIS — Z853 Personal history of malignant neoplasm of breast: Secondary | ICD-10-CM

## 2024-09-01 DIAGNOSIS — C50412 Malignant neoplasm of upper-outer quadrant of left female breast: Secondary | ICD-10-CM | POA: Diagnosis not present

## 2024-09-02 DIAGNOSIS — E559 Vitamin D deficiency, unspecified: Secondary | ICD-10-CM | POA: Diagnosis not present

## 2024-09-02 DIAGNOSIS — M810 Age-related osteoporosis without current pathological fracture: Secondary | ICD-10-CM | POA: Diagnosis not present

## 2024-09-03 ENCOUNTER — Encounter: Payer: Self-pay | Admitting: Oncology

## 2024-09-03 LAB — COMPREHENSIVE METABOLIC PANEL WITH GFR
ALT: 10 IU/L (ref 0–32)
AST: 26 IU/L (ref 0–40)
Albumin: 4 g/dL (ref 3.8–4.8)
Alkaline Phosphatase: 81 IU/L (ref 49–135)
BUN/Creatinine Ratio: 23 (ref 12–28)
BUN: 16 mg/dL (ref 8–27)
Bilirubin Total: 0.3 mg/dL (ref 0.0–1.2)
CO2: 25 mmol/L (ref 20–29)
Calcium: 9.4 mg/dL (ref 8.7–10.3)
Chloride: 103 mmol/L (ref 96–106)
Creatinine, Ser: 0.7 mg/dL (ref 0.57–1.00)
Globulin, Total: 2.3 g/dL (ref 1.5–4.5)
Glucose: 103 mg/dL — ABNORMAL HIGH (ref 70–99)
Potassium: 4.1 mmol/L (ref 3.5–5.2)
Sodium: 142 mmol/L (ref 134–144)
Total Protein: 6.3 g/dL (ref 6.0–8.5)
eGFR: 92 mL/min/1.73 (ref >59)

## 2024-09-03 LAB — VITAMIN D 25 HYDROXY (VIT D DEFICIENCY, FRACTURES): Vit D, 25-Hydroxy: 44.2 ng/mL (ref 30.0–100.0)

## 2024-09-07 ENCOUNTER — Other Ambulatory Visit: Payer: Self-pay | Admitting: Oncology

## 2024-09-07 DIAGNOSIS — C50412 Malignant neoplasm of upper-outer quadrant of left female breast: Secondary | ICD-10-CM

## 2024-09-09 ENCOUNTER — Ambulatory Visit: Admitting: "Endocrinology

## 2024-09-09 ENCOUNTER — Encounter: Payer: Self-pay | Admitting: "Endocrinology

## 2024-09-09 VITALS — BP 96/62 | HR 70 | Resp 18 | Ht 62.0 in | Wt 106.2 lb

## 2024-09-09 DIAGNOSIS — E559 Vitamin D deficiency, unspecified: Secondary | ICD-10-CM

## 2024-09-09 DIAGNOSIS — M810 Age-related osteoporosis without current pathological fracture: Secondary | ICD-10-CM | POA: Diagnosis not present

## 2024-09-09 NOTE — Progress Notes (Signed)
 09/09/2024, 5:22 PM   Endocrinology follow-up note  Subjective:    Patient ID: Pamela Henson, female    DOB: 12/05/1950, PCP Bluford Jacqulyn MATSU, DO   Past Medical History:  Diagnosis Date   Allergy  2024   Anemia    Arthritis 1995   Bipolar 1 disorder (HCC)    Bipolar 1 disorder (HCC)    Cancer (HCC) 2024   Clotting disorder 2024   Complication of anesthesia    Depression 2000   Headache    History of chicken pox    History of measles    History of mumps    Osteoporosis 2024   PONV (postoperative nausea and vomiting)    Stroke (HCC) 1975   Urine retention    takes Flomax    Past Surgical History:  Procedure Laterality Date   ABDOMINAL HYSTERECTOMY  09/24/1977   age 73 due to vaginal bleeding   BREAST BIOPSY Left 08/30/2023   US  LT BREAST BX W LOC DEV 1ST LESION IMG BX SPEC US  GUIDE 08/30/2023 [facility]   BREAST LUMPECTOMY Left 09/26/2023   COLONOSCOPY N/A 10/30/2017   Procedure: COLONOSCOPY;  Surgeon: Golda Claudis PENNER, MD;  Location: [facility];  Service: Endoscopy;  Laterality: N/A;  1225   COLONOSCOPY WITH PROPOFOL  N/A 10/13/2022   Procedure: COLONOSCOPY WITH PROPOFOL ;  Surgeon: Eartha Angelia Sieving, MD;  Location: [facility];  Service: Gastroenterology;  Laterality: N/A;   ESOPHAGOGASTRODUODENOSCOPY (EGD) WITH PROPOFOL  N/A 10/13/2022   Procedure: ESOPHAGOGASTRODUODENOSCOPY (EGD) WITH PROPOFOL ;  Surgeon: Eartha Angelia Sieving, MD;  Location: [facility];  Service: Gastroenterology;  Laterality: N/A;   FRACTURE SURGERY  2024   ORIF TIBIA PLATEAU Right 12/12/2022   Procedure: OPEN REDUCTION INTERNAL FIXATION (ORIF) TIBIAL PLATEAU;  Surgeon: Kendal Franky SQUIBB, MD;  Location: [facility];  Service: Orthopedics;  Laterality: Right;   PARTIAL MASTECTOMY WITH AXILLARY SENTINEL LYMPH NODE BIOPSY Left 09/26/2023   Procedure: PARTIAL MASTECTOMY WITH AXILLARY SENTINEL LYMPH NODE BIOPSY;  Surgeon: Kallie Manuelita BROCKS, MD;  Location: [facility];  Service: General;  Laterality: Left;   PERIPHERAL VASCULAR THROMBECTOMY Right 11/2022   2 DVT following surgical intervention of fracture   POLYPECTOMY  10/13/2022   Procedure: POLYPECTOMY;  Surgeon: Eartha Angelia Sieving, MD;  Location: [facility];  Service: Gastroenterology;;   PORTACATH PLACEMENT Right 11/01/2023   Procedure: INSERTION PORT-A-CATH;  Surgeon: Kallie Manuelita BROCKS, MD;  Location: [facility];  Service: General;  Laterality: Right;   Social History   Socioeconomic History   Marital status: Widowed    Spouse name: Not on file   Number of children: 1   Years of education: Not on file   Highest education level: Some college, no degree  Occupational History   Occupation: Disablity    Comment: Due to back problems  Tobacco Use   Smoking status: Former    Current packs/day: 0.00    Average packs/day: 1 pack/day for 15.0 years (15.0 ttl pk-yrs)    Types: Cigarettes    Start date: 09/25/1983    Quit date: 09/24/1998    Years since quitting: 25.9   Smokeless tobacco: Never   Tobacco comments:    I quit  Vaping Use   Vaping status:  Never Used  Substance and Sexual Activity   Alcohol use: No   Drug use: No   Sexual activity: Not Currently  Other Topics Concern   Not on file  Social History Narrative   Not on file   Social Drivers of Health   Tobacco Use: Medium Risk (09/09/2024)   Patient History    Smoking Tobacco Use: Former    Smokeless Tobacco Use: Never    Passive Exposure: Not on file  Financial Resource Strain: Low Risk (05/11/2024)   Overall Financial Resource Strain (CARDIA)    Difficulty of Paying Living Expenses: Not very hard  Food Insecurity: No Food Insecurity (05/11/2024)   Epic    Worried About Radiation Protection Practitioner of Food in the Last Year: Never true    Ran Out of Food in the Last Year: Never true  Transportation Needs: No Transportation Needs (05/11/2024)   Epic    Lack of Transportation (Medical): No    Lack of  Transportation (Non-Medical): No  Physical Activity: Inactive (05/11/2024)   Exercise Vital Sign    Days of Exercise per Week: 0 days    Minutes of Exercise per Session: 0 min  Stress: Stress Concern Present (05/11/2024)   [employer] of Occupational Health - Occupational Stress Questionnaire    Feeling of Stress: To some extent  Social Connections: Socially Isolated (05/11/2024)   Social Connection and Isolation Panel    Frequency of Communication with Friends and Family: More than three times a week    Frequency of Social Gatherings with Friends and Family: Three times a week    Attends Religious Services: Never    Active Member of Clubs or Organizations: No    Attends Banker Meetings: Never    Marital Status: Widowed  Depression (PHQ2-9): Low Risk (08/19/2024)   Depression (PHQ2-9)    PHQ-2 Score: 0  Alcohol Screen: Low Risk (05/11/2024)   Alcohol Screen    Last Alcohol Screening Score (AUDIT): 0  Housing: Low Risk (05/11/2024)   Epic    Unable to Pay for Housing in the Last Year: No    Number of Times Moved in the Last Year: 0    Homeless in the Last Year: No  Utilities: Low Risk (03/04/2024)   Received from [facility]   Utilities    Within the past 12 months, have you been unable to get utilities(heat, electricity) when it was really needed?: No  Health Literacy: Patient Declined (05/10/2023)   B1300 Health Literacy    Frequency of need for help with medical instructions: Patient declines to respond   Family History  Problem Relation Age of Onset   Stroke Mother 40   Early death Mother    Stroke Father 60   Early death Father    Colon cancer Neg Hx    Breast cancer Neg Hx    Outpatient Encounter Medications as of 09/09/2024  Medication Sig   acetaminophen  (TYLENOL ) 500 MG tablet Take 500 mg by mouth every 6 (six) hours as needed for mild pain, moderate pain or headache.   anastrozole  (ARIMIDEX ) 1 MG tablet Take 1 tablet (1 mg total) by mouth  daily.   calcium -vitamin D  (OSCAL WITH D) 500-5 MG-MCG tablet Take 1 tablet by mouth 2 (two) times daily with a meal.   Cholecalciferol  (VITAMIN D ) 50 MCG (2000 UT) tablet Take 2,000 Units by mouth daily.   gabapentin  (NEURONTIN ) 100 MG capsule Take 100 mg by mouth 3 (three) times daily.   lidocaine -prilocaine  (EMLA ) cream  Apply to affected area once   melatonin 5 MG TABS Take 5 mg by mouth at bedtime as needed (sleep).   Melatonin-Pyridoxine 5-1 MG TABS Take 5 mg by mouth.   Multiple Vitamins-Minerals (MULTIVITAMIN WITH MINERALS) tablet Take 1 tablet by mouth daily.   rivaroxaban  (XARELTO ) 20 MG TABS tablet Take 1 tablet (20 mg total) by mouth daily with supper.   rosuvastatin  (CRESTOR ) 5 MG tablet Take 1 tablet (5 mg total) by mouth daily.   sertraline  (ZOLOFT ) 50 MG tablet Take 50 mg by mouth at bedtime. (Patient taking differently: Take 50 mg by mouth at bedtime. Take one and a half tablets a day.)   tamsulosin  (FLOMAX ) 0.4 MG CAPS capsule Take 1 capsule (0.4 mg total) by mouth at bedtime.   Trastuzumab -anns (KANJINTI  IV) Inject into the vein.   No facility-administered encounter medications on file as of 09/09/2024.   ALLERGIES: Allergies  Allergen Reactions   Asa [Aspirin] Other (See Comments)    Had GI bleed 09/2022   Eliquis [Apixaban]     Severe headaches    Oxycodone      Agitation, caused bipolar episode     VACCINATION STATUS: Immunization History  Administered Date(s) Administered   Fluad Trivalent(High Dose 65+) 08/17/2023   Moderna Covid-19 Fall Seasonal Vaccine 16yrs & older 08/17/2023   PNEUMOCOCCAL CONJUGATE-20 08/20/2023    HPI Pamela Henson is 73 y.o. female who presents today with a medical history as above. she is being seen in follow-up after she was seen in consultation for osteoporosis requested by Cook, Jayce G, DO.    Patient did have right lower extremity fracture in March 2024 due to a fall from stool she was standing on to fix something overhead.   It was reported as closed bicondylar fracture. Subsequently, she was offered screening bone density which she had on July 31, 2023 showing negative T-score of -3.1 on AP spine, -4.3 on right femur, and -4.0 on dual femur total mean. She was not previously diagnosed with osteoporosis and this was her first bone density.  She is a former smoker.  She denies any exposure to high-dose steroids.  She denies height loss. She was started on yearly Reclast  infusion during the prior visit.  She has received her first treatment in January 2024.  She has tolerated this treatment very well.  She is scheduled for her next treatment in January 2025.  She has no new complaints today.  Her previsit labs are favorable including correction of her previous hypocalcemia.  Recently, she was diagnosed with breast cancer, currently being for options of treatment.  She denies any thyroid /parathyroid dysfunctions. She is on regular vitamin D  supplement. Her other medical problems include anemia, mood disorders, arthritis, and degenerative lumbosacral disc. -She does not follow any particular diet or exercise regimen.  She has no major weight change, reports to have been normal with slight weight with current BMI of 19.66. Her daily and dietary intake of calcium  seems to be appropriate.   Review of Systems  Constitutional: +mildly fluctuating body weight , no fatigue, no subjective hyperthermia, no subjective hypothermia Eyes: no blurry vision, no xerophthalmia ENT: no sore throat, no nodules palpated in throat, no dysphagia/odynophagia, no hoarseness   Objective:       09/09/2024    1:11 PM 08/19/2024   12:54 PM 08/19/2024   11:36 AM  Vitals with BMI  Height 5' 2    Weight 106 lbs 3 oz    BMI 19.42    Systolic  96 107   Diastolic 62 69   Pulse 70 80 88    BP 96/62   Pulse 70   Resp 18   Ht 5' 2 (1.575 m)   Wt 106 lb 3.2 oz (48.2 kg)   SpO2 98%   BMI 19.42 kg/m   Wt Readings from Last 3  Encounters:  09/09/24 106 lb 3.2 oz (48.2 kg)  08/19/24 105 lb 11.2 oz (47.9 kg)  07/29/24 107 lb 5.8 oz (48.7 kg)    Physical Exam  Constitutional:  Body mass index is 19.42 kg/m.,  not in acute distress, normal state of mind Eyes: PERRLA, EOMI, no exophthalmos ENT: moist mucous membranes, no gross thyromegaly, no gross cervical lymphadenopathy Cardiovascular: normal precordial activity, Regular Rate and Rhythm, no Murmur/Rubs/Gallops   CMP ( most recent) CMP     Component Value Date/Time   NA 142 09/02/2024 1025   K 4.1 09/02/2024 1025   CL 103 09/02/2024 1025   CO2 25 09/02/2024 1025   GLUCOSE 103 (H) 09/02/2024 1025   GLUCOSE 225 (H) 08/19/2024 1054   BUN 16 09/02/2024 1025   CREATININE 0.70 09/02/2024 1025   CALCIUM  9.4 09/02/2024 1025   PROT 6.3 09/02/2024 1025   ALBUMIN 4.0 09/02/2024 1025   AST 26 09/02/2024 1025   ALT 10 09/02/2024 1025   ALKPHOS 81 09/02/2024 1025   BILITOT 0.3 09/02/2024 1025   EGFR 92 09/02/2024 1025   GFRNONAA >60 08/19/2024 1054    Lipid Panel     Component Value Date/Time   CHOL 189 08/20/2023 1122   TRIG 124 08/20/2023 1122   HDL 56 08/20/2023 1122   CHOLHDL 3.4 08/20/2023 1122   LDLCALC 111 (H) 08/20/2023 1122   LABVLDL 22 08/20/2023 1122     AP Spine L1-L3 (L2) 07/31/2023 70.5 Osteoporosis -3.1 0.789 g/cm2 - -DualFemur Total Right 07/31/2023 70.5 Osteoporosis -4.3 0.461 g/cm2   DualFemur Total Mean 07/31/2023 70.5 Osteoporosis -4.0 0.499 g/cm2 - - ASSESSMENT: The BMD measured at Femur Total Right is 0.461 g/cm2 with a T-score of -4.3. This patient is considered osteoporotic according to World Health Organization [facility]) criteria. The scan quality is good. L2 and L4 were excluded due to advanced degenerative changes.  Assessment & Plan:   Severe osteoporosis -spine/hip   2.  Hypocalcemia    3.  Vitamin D  deficiency  - I have reviewed her available  and new  records and clinically evaluated the patient. - Based on these  reviews, she has severe osteoporosis of the spine, femur, and hip. She presents with correction of her previously documented mild hypocalcemia. Workup for secondary causes of osteoporosis are so far been unrevealing. -She will be continued on IV Reclast  yearly.  She has received her first treatment in January 2024, scheduled for her second treatment in January 2025.  Her labs are favorable including renal function and calcium  levels.    She is encouraged to continue calcium  and vitamin D  combination supplement.  She will need repeat bone density in November November 2026.   -she has high risk for fracture which was discussed with her.  She will return in 6 months for reevaluation. - she is advised to maintain close follow up with Cook, Jayce G, DO for primary care needs.  I spent  20  minutes in the care of the patient today including review of labs from Thyroid  Function, CMP, and other relevant labs ; imaging/biopsy records (current and previous including abstractions from other facilities); face-to-face time discussing  her  lab results and symptoms, medications doses, her options of short and long term treatment based on the latest standards of care / guidelines;   and documenting the encounter.  Pamela Henson  participated in the discussions, expressed understanding, and voiced agreement with the above plans.  All questions were answered to her satisfaction. she is encouraged to contact clinic should she have any questions or concerns prior to her return visit.  Follow up plan: Return in about 6 months (around 03/10/2025) for F/U with Pre-visit Labs.   Ranny Earl, MD [facility] [address] [place], [place] [address] Phone: [phone]  Fax: [phone]     09/09/2024, 5:22 PM  This note was partially dictated with voice recognition software. Similar sounding words can be transcribed inadequately or may not  be corrected upon  review.

## 2024-09-11 ENCOUNTER — Inpatient Hospital Stay

## 2024-09-11 ENCOUNTER — Inpatient Hospital Stay: Attending: Oncology | Admitting: Oncology

## 2024-09-11 VITALS — BP 124/82 | HR 71 | Temp 96.7°F

## 2024-09-11 DIAGNOSIS — F17210 Nicotine dependence, cigarettes, uncomplicated: Secondary | ICD-10-CM | POA: Diagnosis not present

## 2024-09-11 DIAGNOSIS — C50412 Malignant neoplasm of upper-outer quadrant of left female breast: Secondary | ICD-10-CM

## 2024-09-11 DIAGNOSIS — Z5112 Encounter for antineoplastic immunotherapy: Secondary | ICD-10-CM | POA: Diagnosis present

## 2024-09-11 DIAGNOSIS — Z1722 Progesterone receptor negative status: Secondary | ICD-10-CM | POA: Insufficient documentation

## 2024-09-11 DIAGNOSIS — M2550 Pain in unspecified joint: Secondary | ICD-10-CM | POA: Insufficient documentation

## 2024-09-11 DIAGNOSIS — Z1731 Human epidermal growth factor receptor 2 positive status: Secondary | ICD-10-CM | POA: Diagnosis not present

## 2024-09-11 DIAGNOSIS — Z720 Tobacco use: Secondary | ICD-10-CM | POA: Diagnosis not present

## 2024-09-11 DIAGNOSIS — Z923 Personal history of irradiation: Secondary | ICD-10-CM | POA: Insufficient documentation

## 2024-09-11 DIAGNOSIS — Z86711 Personal history of pulmonary embolism: Secondary | ICD-10-CM | POA: Diagnosis not present

## 2024-09-11 DIAGNOSIS — Z8673 Personal history of transient ischemic attack (TIA), and cerebral infarction without residual deficits: Secondary | ICD-10-CM | POA: Insufficient documentation

## 2024-09-11 DIAGNOSIS — I8290 Acute embolism and thrombosis of unspecified vein: Secondary | ICD-10-CM | POA: Diagnosis not present

## 2024-09-11 DIAGNOSIS — Z7901 Long term (current) use of anticoagulants: Secondary | ICD-10-CM | POA: Diagnosis not present

## 2024-09-11 DIAGNOSIS — M810 Age-related osteoporosis without current pathological fracture: Secondary | ICD-10-CM | POA: Diagnosis not present

## 2024-09-11 DIAGNOSIS — Z170 Estrogen receptor positive status [ER+]: Secondary | ICD-10-CM | POA: Diagnosis not present

## 2024-09-11 DIAGNOSIS — Z86718 Personal history of other venous thrombosis and embolism: Secondary | ICD-10-CM | POA: Insufficient documentation

## 2024-09-11 DIAGNOSIS — Z79811 Long term (current) use of aromatase inhibitors: Secondary | ICD-10-CM | POA: Insufficient documentation

## 2024-09-11 LAB — CBC WITH DIFFERENTIAL/PLATELET
Abs Immature Granulocytes: 0.01 K/uL (ref 0.00–0.07)
Basophils Absolute: 0.1 K/uL (ref 0.0–0.1)
Basophils Relative: 1 %
Eosinophils Absolute: 0.1 K/uL (ref 0.0–0.5)
Eosinophils Relative: 2 %
HCT: 42 % (ref 36.0–46.0)
Hemoglobin: 13.8 g/dL (ref 12.0–15.0)
Immature Granulocytes: 0 %
Lymphocytes Relative: 26 %
Lymphs Abs: 1.7 K/uL (ref 0.7–4.0)
MCH: 32.9 pg (ref 26.0–34.0)
MCHC: 32.9 g/dL (ref 30.0–36.0)
MCV: 100 fL (ref 80.0–100.0)
Monocytes Absolute: 0.4 K/uL (ref 0.1–1.0)
Monocytes Relative: 7 %
Neutro Abs: 4.2 K/uL (ref 1.7–7.7)
Neutrophils Relative %: 64 %
Platelets: 212 K/uL (ref 150–400)
RBC: 4.2 MIL/uL (ref 3.87–5.11)
RDW: 13.4 % (ref 11.5–15.5)
WBC: 6.5 K/uL (ref 4.0–10.5)
nRBC: 0 % (ref 0.0–0.2)

## 2024-09-11 LAB — COMPREHENSIVE METABOLIC PANEL WITH GFR
ALT: 9 U/L (ref 0–44)
AST: 24 U/L (ref 15–41)
Albumin: 4 g/dL (ref 3.5–5.0)
Alkaline Phosphatase: 73 U/L (ref 38–126)
Anion gap: 12 (ref 5–15)
BUN: 17 mg/dL (ref 8–23)
CO2: 23 mmol/L (ref 22–32)
Calcium: 9.4 mg/dL (ref 8.9–10.3)
Chloride: 107 mmol/L (ref 98–111)
Creatinine, Ser: 0.54 mg/dL (ref 0.44–1.00)
GFR, Estimated: >60 mL/min
Glucose, Bld: 90 mg/dL (ref 70–99)
Potassium: 4.1 mmol/L (ref 3.5–5.1)
Sodium: 142 mmol/L (ref 135–145)
Total Bilirubin: 0.3 mg/dL (ref 0.0–1.2)
Total Protein: 6.4 g/dL — ABNORMAL LOW (ref 6.5–8.1)

## 2024-09-11 LAB — MAGNESIUM: Magnesium: 2.3 mg/dL (ref 1.7–2.4)

## 2024-09-11 MED ORDER — SODIUM CHLORIDE 0.9 % IV SOLN: INTRAVENOUS | Status: DC

## 2024-09-11 MED ORDER — TRASTUZUMAB-ANNS CHEMO 420 MG IV SOLR
6.0000 mg/kg | Freq: Once | INTRAVENOUS | Status: AC
  Administered 2024-09-11: 300 mg via INTRAVENOUS
  Filled 2024-09-11: qty 14.29

## 2024-09-11 MED ORDER — ZOLEDRONIC ACID 5 MG/100ML IV SOLN: 5.0000 mg | Freq: Once | INTRAVENOUS | Status: DC

## 2024-09-11 MED ORDER — ACETAMINOPHEN 325 MG PO TABS: 650.0000 mg | ORAL_TABLET | Freq: Once | ORAL | Status: DC

## 2024-09-11 MED ORDER — DIPHENHYDRAMINE HCL 25 MG PO CAPS: 25.0000 mg | ORAL_CAPSULE | Freq: Once | ORAL | Status: DC

## 2024-09-11 NOTE — Progress Notes (Unsigned)
 " Patient Care Team: Cook, Jayce G, DO as PCP - General (Family Medicine) Blinda Ferry, MD as Consulting Physician (Psychiatry) Joshua Pao, NP as Nurse Practitioner (Psychiatry) Celestia Joesph SQUIBB, RN as Oncology Nurse Navigator (Medical Oncology) Lenis Ethelle ORN, MD as Consulting Physician (Endocrinology) Lamon Pleasant CHRISTELLA DEVONNA as Physician Assistant (Oncology) Dannielle Alm BRAVO, MD as Referring Physician (Radiation Oncology) [facility], [facility]   Clinic Day:  09/11/2024  Referring physician: Bluford Jacqulyn MATSU, DO   CHIEF COMPLAINT:  CC: Stage IIa (T2 N0 M0 G2 ER positive, PR negative,HER2+) left breast UOQ IDC   ASSESSMENT & PLAN:   Assessment & Plan: Pamela Henson  is a 73 y.o. 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  Will continue anastrozole  1 mg daily - Will continue Herceptin  every 3 weeks for a total of 52 weeks - Last mammogram was from 08/2023.  Will repeat in December 2025.  Scheduled for 09/01/2024 - Last DEXA scan was in 07/2023 with a T-score of -4.3.  Will repeat in 2 years - Continue vitamin D  and calcium  - Last echo from 03/2024: LVEF: 65 to 70%.  Will repeat in 6 months that is 09/2024.  Scheduled for 09/25/2024   Return to clinic for follow-up in 6 weeks.  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  Will continue for a  lifetime because of recurrent thrombosis also in the setting of breast carcinoma  Osteoporosis Last DEXA scan from 07/2023 with a T-score of -4.3 Gets Reclast /zoledronic  acid injections every year.  Last one was in 09/2023   - Will administer Reclast  in 09/2024 - Continue calcium  and vitamin D  - Will repeat DEXA scan in 2 years that is 07/2025   The patient understands the plans discussed today and is in agreement with them.  She knows to contact our office if she develops concerns prior to her next appointment.  The total time spent in the appointment was 20 minutes for the encounter  with patient, including review of chart and various tests results, discussions about plan of care and coordination of care plan   Mickiel Dry, MD  [facility]  - A DEPT OF [facility] [address] [place] [address] Dept: [phone] Dept Fax: [phone]   No orders of the defined types were placed in this encounter.    ONCOLOGY HISTORY:   I have reviewed her chart and materials related to her cancer extensively and collaborated history with the patient. 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 -08/30/2023: Left breast core biopsy.  Pathology: Invasive ductal carcinoma, 1.7 cm.  DCIS solid and cribriform types, intermediate nuclear grade. No lymphovascular invasion identified. No calcifications identified. Tumor cells are ER positive (95%), Her2 positive (3+), and PR negative. Ki-67 is 5%.  -09/06/2023: CT chest: No new or progressive interval findings to suggest metastatic disease in the chest. -09/26/2023: Left breast lumpectomy and SNLB.   Pathology: Invasive ductal carcinoma, 2.4 cm, grade 2. Ductal carcinoma in situ, intermediate-grade. Resection margins are negative for carcinoma. Negative for lymphovascular or perineural invasion. Three lymph nodes, negative for carcinoma (0/3). Pathologic staging: pT2, pN0  -11/04/2023-02/18/2024: 6 cycles of TCH  -02/19/2024-current: Maintenance Herceptin  every 3 weeks -03/31/2024-current: Anastrozole  1mg  daily -05/01/2024-05/06/2024: Radiation therapy to left breast and chest wall  Current Treatment: Maintenance Herceptin  plus anastrozole   INTERVAL HISTORY:   Discussed the use of AI scribe software for clinical note transcription with the patient, who gave verbal consent to proceed.  History of Present Illness Ravleen Henson is a 73 year old female who presents for a follow-up visit for her breast cancer.  She experiences occasional discomfort at the site of her port, describing it as 'uncomfortable.'  She continues anastrozole  therapy without experiencing side effects such as hot flashes or joint pains. No breathing difficulties or shortness of breath are reported.  She is on Xarelto  for venous thromboembolism. She is also taking calcium  and vitamin D  supplements as part of her treatment regimen.  Her last mammogram was in December of the previous year and will repeat it next month.  No hot flashes, joint pains, breathing difficulties, shortness of breath, or chills.   I have reviewed the past medical history, past surgical history, social history and family history with the patient and they are unchanged from previous note.  ALLERGIES:  is allergic to asa [aspirin], eliquis [apixaban], and oxycodone .  MEDICATIONS:  Current Outpatient Medications  Medication Sig Dispense Refill   acetaminophen  (TYLENOL ) 500 MG tablet Take 500 mg by mouth every 6 (six) hours as needed for mild pain, moderate pain or headache.     anastrozole  (ARIMIDEX ) 1 MG tablet Take 1 tablet (1 mg total)  by mouth daily. 30 tablet 5   calcium -vitamin D  (OSCAL WITH D) 500-5 MG-MCG tablet Take 1 tablet by mouth 2 (two) times daily with a meal. 180 tablet 1   Cholecalciferol  (VITAMIN D ) 50 MCG (2000 UT) tablet Take 2,000 Units by mouth daily.     gabapentin  (NEURONTIN ) 100 MG capsule Take 100 mg by mouth 3 (three) times daily.     lidocaine -prilocaine  (EMLA ) cream Apply to affected area once 30 g 3   melatonin 5 MG TABS Take 5 mg by mouth at bedtime as needed (sleep).     Melatonin-Pyridoxine 5-1 MG TABS Take 5 mg by mouth.     Multiple Vitamins-Minerals (MULTIVITAMIN WITH MINERALS) tablet Take 1 tablet by mouth daily.     rivaroxaban  (XARELTO ) 20 MG TABS tablet Take 1 tablet (20 mg total) by mouth daily with supper. 30 tablet 12   rosuvastatin  (CRESTOR ) 5 MG tablet Take 1 tablet (5 mg total) by mouth daily. 90 tablet 3   sertraline  (ZOLOFT ) 50 MG tablet Take 50 mg by mouth at bedtime. (Patient taking differently: Take 50 mg by mouth at bedtime. Take one and a half tablets a day.)     tamsulosin  (FLOMAX ) 0.4 MG CAPS capsule Take 1 capsule (0.4 mg total) by mouth at bedtime. 90 capsule 3   Trastuzumab -anns (KANJINTI  IV) Inject into the vein.     No current facility-administered medications for this visit.  REVIEW OF SYSTEMS:   Constitutional: Denies fevers, chills or abnormal weight loss Eyes: Denies blurriness of vision Ears, nose, mouth, throat, and face: Denies mucositis or sore throat Respiratory: Denies cough, dyspnea or wheezes Cardiovascular: Denies palpitation, chest discomfort or lower extremity swelling Gastrointestinal:  Denies nausea, heartburn or change in bowel habits Skin: Denies abnormal skin rashes Lymphatics: Denies new lymphadenopathy or easy bruising Neurological:Denies numbness, tingling or new weaknesses Behavioral/Psych: Mood is stable, no new changes  All other systems were reviewed with the patient and are negative.   VITALS:  There were no vitals taken for this  visit.  Wt Readings from Last 3 Encounters:  09/09/24 106 lb 3.2 oz (48.2 kg)  08/19/24 105 lb 11.2 oz (47.9 kg)  07/29/24 107 lb 5.8 oz (48.7 kg)    There is no height or weight on file to calculate BMI.  Performance status (ECOG): 0 - Asymptomatic  PHYSICAL EXAM:   GENERAL:alert, no distress and comfortable SKIN: skin color, texture, turgor are normal, no rashes or significant lesions LYMPH:  no palpable lymphadenopathy in the cervical, axillary or inguinal LUNGS: clear to auscultation and percussion with normal breathing effort HEART: regular rate & rhythm and no murmurs and no lower extremity edema ABDOMEN:abdomen soft, non-tender and normal bowel sounds Musculoskeletal:no cyanosis of digits and no clubbing  NEURO: alert & oriented x 3 with fluent speech  LABORATORY DATA:  I have reviewed the data as listed   Lab Results  Component Value Date   WBC 6.4 08/19/2024   NEUTROABS 4.6 08/19/2024   HGB 13.5 08/19/2024   HCT 42.0 08/19/2024   MCV 101.7 (H) 08/19/2024   PLT 195 08/19/2024      Chemistry      Component Value Date/Time   NA 142 09/02/2024 1025   K 4.1 09/02/2024 1025   CL 103 09/02/2024 1025   CO2 25 09/02/2024 1025   BUN 16 09/02/2024 1025   CREATININE 0.70 09/02/2024 1025      Component Value Date/Time   CALCIUM  9.4 09/02/2024 1025   ALKPHOS 81 09/02/2024 1025   AST 26 09/02/2024 1025   ALT 10 09/02/2024 1025   BILITOT 0.3 09/02/2024 1025       RADIOGRAPHIC STUDIES: I have personally reviewed the radiological images as listed and agreed with the findings in the report.  MM DIAG BREAST TOMO BILATERAL CLINICAL DATA:  73 year old woman status post malignant LEFT lumpectomy in January 2025 (IDC and DCIS) also treated with radiation and chemotherapy presents for first post lumpectomy mammogram.  EXAM: DIGITAL DIAGNOSTIC BILATERAL MAMMOGRAM WITH TOMOSYNTHESIS AND CAD  TECHNIQUE: Bilateral digital diagnostic mammography and breast  tomosynthesis was performed. The images were evaluated with computer-aided detection.  COMPARISON:  Previous exam(s).  ACR Breast Density Category c: The breasts are heterogeneously dense, which may obscure small masses.  FINDINGS: RIGHT:  Mammogram: No suspicious mass, distortion, or microcalcifications are identified to suggest presence of malignancy.  LEFT:  Mammogram: Post lumpectomy changes are seen in the upper outer LEFT breast. No suspicious mass, distortion, or microcalcifications are identified to suggest presence of malignancy.  IMPRESSION: 1. No evidence of RIGHT or LEFT breast malignancy. 2. Lumpectomy changes seen in the upper outer LEFT breast.  RECOMMENDATION: BILATERAL diagnostic mammogram in 1 year  I have discussed the findings and recommendations with the patient. If applicable, a reminder letter will be sent to the patient regarding the next appointment.  BI-RADS CATEGORY  2: Benign.  Electronically Signed   By: Aliene Katha HERO.D.  On: 09/01/2024 10:51   "

## 2024-09-11 NOTE — Progress Notes (Signed)
Treatment given per orders. Patient tolerated it well without problems. Vitals stable and discharged home from clinic ambulatory. Follow up as scheduled.  

## 2024-09-11 NOTE — Patient Instructions (Signed)
 [facility] [place] - A DEPT OF [facility]  Discharge Instructions: Thank you for choosing [facility] to provide your oncology and hematology care.  If you have a lab appointment with the Cancer Center - please note that after April 8th, 2024, all labs will be drawn in the cancer center.  You do not have to check in or register with the main entrance as you have in the past but will complete your check-in in the cancer center.  Wear comfortable clothing and clothing appropriate for easy access to any Portacath or PICC line.   We strive to give you quality time with your provider. You may need to reschedule your appointment if you arrive late (15 or more minutes).  Arriving late affects you and other patients whose appointments are after yours.  Also, if you miss three or more appointments without notifying the office, you may be dismissed from the clinic at the provider's discretion.      For prescription refill requests, have your pharmacy contact our office and allow 72 hours for refills to be completed.    Today you received the following chemotherapy and/or immunotherapy agents Kanjinti      To help prevent nausea and vomiting after your treatment, we encourage you to take your nausea medication as directed.  BELOW ARE SYMPTOMS THAT SHOULD BE REPORTED IMMEDIATELY: *FEVER GREATER THAN 100.4 F (38 C) OR HIGHER *CHILLS OR SWEATING *NAUSEA AND VOMITING THAT IS NOT CONTROLLED WITH YOUR NAUSEA MEDICATION *UNUSUAL SHORTNESS OF BREATH *UNUSUAL BRUISING OR BLEEDING *URINARY PROBLEMS (pain or burning when urinating, or frequent urination) *BOWEL PROBLEMS (unusual diarrhea, constipation, pain near the anus) TENDERNESS IN MOUTH AND THROAT WITH OR WITHOUT PRESENCE OF ULCERS (sore throat, sores in mouth, or a toothache) UNUSUAL RASH, SWELLING OR PAIN  UNUSUAL VAGINAL DISCHARGE OR ITCHING   Items with * indicate a potential emergency and should be followed up  as soon as possible or go to the Emergency Department if any problems should occur.  Please show the CHEMOTHERAPY ALERT CARD or IMMUNOTHERAPY ALERT CARD at check-in to the Emergency Department and triage nurse.  Should you have questions after your visit or need to cancel or reschedule your appointment, please contact [facility] [place] - A DEPT OF [facility] [phone]  and follow the prompts.  Office hours are 8:00 a.m. to 4:30 p.m. Monday - Friday. Please note that voicemails left after 4:00 p.m. may not be returned until the following business day.  We are closed weekends and major holidays. You have access to a nurse at all times for urgent questions. Please call the main number to the clinic [phone] and follow the prompts.  For any non-urgent questions, you may also contact your provider using MyChart. We now offer e-Visits for anyone 41 and older to request care online for non-urgent symptoms. For details visit [email or web address].   Also download the MyChart app! Go to the app store, search "MyChart", open the app, select [facility], and log in with your MyChart username and password.

## 2024-09-11 NOTE — Progress Notes (Signed)
 Reclast  5 mg IVPB x 1 to begin in January 2026.  V.O. Dr Ivery Molt, PharmD

## 2024-09-12 ENCOUNTER — Encounter: Payer: Self-pay | Admitting: Oncology

## 2024-09-21 ENCOUNTER — Encounter: Payer: Self-pay | Admitting: *Deleted

## 2024-09-25 ENCOUNTER — Ambulatory Visit (HOSPITAL_COMMUNITY)
Admission: RE | Admit: 2024-09-25 | Discharge: 2024-09-25 | Disposition: A | Discharge status: Home / Self Care | Source: Ambulatory Visit | Attending: Oncology | Admitting: Oncology

## 2024-09-25 DIAGNOSIS — Z79899 Other long term (current) drug therapy: Secondary | ICD-10-CM | POA: Insufficient documentation

## 2024-09-25 LAB — ECHOCARDIOGRAM COMPLETE
Area-P 1/2: 3.91 cm2
S' Lateral: 2.9 cm

## 2024-09-25 NOTE — Progress Notes (Signed)
*  PRELIMINARY RESULTS* Echocardiogram 2D Echocardiogram has been performed.  Pamela Henson 09/25/2024, 12:23 PM

## 2024-10-02 ENCOUNTER — Inpatient Hospital Stay: Attending: Oncology

## 2024-10-02 ENCOUNTER — Inpatient Hospital Stay

## 2024-10-02 VITALS — BP 102/64 | HR 65 | Temp 97.6°F | Resp 18

## 2024-10-02 DIAGNOSIS — Z8673 Personal history of transient ischemic attack (TIA), and cerebral infarction without residual deficits: Secondary | ICD-10-CM | POA: Diagnosis not present

## 2024-10-02 DIAGNOSIS — Z86718 Personal history of other venous thrombosis and embolism: Secondary | ICD-10-CM | POA: Diagnosis not present

## 2024-10-02 DIAGNOSIS — M2550 Pain in unspecified joint: Secondary | ICD-10-CM | POA: Diagnosis not present

## 2024-10-02 DIAGNOSIS — Z86711 Personal history of pulmonary embolism: Secondary | ICD-10-CM | POA: Diagnosis not present

## 2024-10-02 DIAGNOSIS — Z1731 Human epidermal growth factor receptor 2 positive status: Secondary | ICD-10-CM | POA: Diagnosis not present

## 2024-10-02 DIAGNOSIS — C50412 Malignant neoplasm of upper-outer quadrant of left female breast: Secondary | ICD-10-CM | POA: Diagnosis present

## 2024-10-02 DIAGNOSIS — Z923 Personal history of irradiation: Secondary | ICD-10-CM | POA: Insufficient documentation

## 2024-10-02 DIAGNOSIS — M810 Age-related osteoporosis without current pathological fracture: Secondary | ICD-10-CM | POA: Insufficient documentation

## 2024-10-02 DIAGNOSIS — Z170 Estrogen receptor positive status [ER+]: Secondary | ICD-10-CM | POA: Diagnosis not present

## 2024-10-02 DIAGNOSIS — Z5112 Encounter for antineoplastic immunotherapy: Secondary | ICD-10-CM | POA: Insufficient documentation

## 2024-10-02 DIAGNOSIS — Z1722 Progesterone receptor negative status: Secondary | ICD-10-CM | POA: Diagnosis not present

## 2024-10-02 DIAGNOSIS — F17210 Nicotine dependence, cigarettes, uncomplicated: Secondary | ICD-10-CM | POA: Diagnosis not present

## 2024-10-02 DIAGNOSIS — Z79811 Long term (current) use of aromatase inhibitors: Secondary | ICD-10-CM | POA: Insufficient documentation

## 2024-10-02 DIAGNOSIS — Z7901 Long term (current) use of anticoagulants: Secondary | ICD-10-CM | POA: Insufficient documentation

## 2024-10-02 LAB — COMPREHENSIVE METABOLIC PANEL WITH GFR
ALT: 9 U/L (ref 0–44)
AST: 26 U/L (ref 15–41)
Albumin: 3.9 g/dL (ref 3.5–5.0)
Alkaline Phosphatase: 78 U/L (ref 38–126)
Anion gap: 13 (ref 5–15)
BUN: 16 mg/dL (ref 8–23)
CO2: 25 mmol/L (ref 22–32)
Calcium: 9.1 mg/dL (ref 8.9–10.3)
Chloride: 104 mmol/L (ref 98–111)
Creatinine, Ser: 0.63 mg/dL (ref 0.44–1.00)
GFR, Estimated: >60 mL/min
Glucose, Bld: 102 mg/dL — ABNORMAL HIGH (ref 70–99)
Potassium: 4.3 mmol/L (ref 3.5–5.1)
Sodium: 142 mmol/L (ref 135–145)
Total Bilirubin: 0.3 mg/dL (ref 0.0–1.2)
Total Protein: 6.2 g/dL — ABNORMAL LOW (ref 6.5–8.1)

## 2024-10-02 LAB — CBC WITH DIFFERENTIAL/PLATELET
Abs Immature Granulocytes: 0.01 K/uL (ref 0.00–0.07)
Basophils Absolute: 0.1 K/uL (ref 0.0–0.1)
Basophils Relative: 1 %
Eosinophils Absolute: 0.2 K/uL (ref 0.0–0.5)
Eosinophils Relative: 3 %
HCT: 43.7 % (ref 36.0–46.0)
Hemoglobin: 13.9 g/dL (ref 12.0–15.0)
Immature Granulocytes: 0 %
Lymphocytes Relative: 25 %
Lymphs Abs: 1.4 K/uL (ref 0.7–4.0)
MCH: 32 pg (ref 26.0–34.0)
MCHC: 31.8 g/dL (ref 30.0–36.0)
MCV: 100.5 fL — ABNORMAL HIGH (ref 80.0–100.0)
Monocytes Absolute: 0.4 K/uL (ref 0.1–1.0)
Monocytes Relative: 7 %
Neutro Abs: 3.4 K/uL (ref 1.7–7.7)
Neutrophils Relative %: 64 %
Platelets: 230 K/uL (ref 150–400)
RBC: 4.35 MIL/uL (ref 3.87–5.11)
RDW: 13.4 % (ref 11.5–15.5)
WBC: 5.4 K/uL (ref 4.0–10.5)
nRBC: 0 % (ref 0.0–0.2)

## 2024-10-02 LAB — MAGNESIUM: Magnesium: 2.2 mg/dL (ref 1.7–2.4)

## 2024-10-02 MED ORDER — SODIUM CHLORIDE 0.9 % IV SOLN: INTRAVENOUS | Status: DC

## 2024-10-02 MED ORDER — TRASTUZUMAB-ANNS CHEMO 150 MG IV SOLR
6.0000 mg/kg | Freq: Once | INTRAVENOUS | Status: AC
  Administered 2024-10-02: 300 mg via INTRAVENOUS
  Filled 2024-10-02: qty 14.29

## 2024-10-02 NOTE — Progress Notes (Signed)
 Patient presents today for Kanjinti  infusion per providers order.  Vital signs within parameters for treatment.  Patient took premedications prior to infusion appt. Today.  Per treatment plan order do not need to wait for labs to result prior to treatment.  Patient has an appt. At the infusion center on January 22nd for Reclast  and chooses to keep that appt at this time.  Patient has no new complaints at this time.  Treatment given today per MD orders.  Stable during infusion without adverse affects.  Vital signs stable.  No complaints at this time.  Discharge from clinic ambulatory in stable condition.  Alert and oriented X 3.  Follow up with [facility] as scheduled.

## 2024-10-02 NOTE — Patient Instructions (Signed)
 [facility] [place] - A DEPT OF [facility]  Discharge Instructions: Thank you for choosing [facility] to provide your oncology and hematology care.  If you have a lab appointment with the Cancer Center - please note that after April 8th, 2024, all labs will be drawn in the cancer center.  You do not have to check in or register with the main entrance as you have in the past but will complete your check-in in the cancer center.  Wear comfortable clothing and clothing appropriate for easy access to any Portacath or PICC line.   We strive to give you quality time with your provider. You may need to reschedule your appointment if you arrive late (15 or more minutes).  Arriving late affects you and other patients whose appointments are after yours.  Also, if you miss three or more appointments without notifying the office, you may be dismissed from the clinic at the provider's discretion.      For prescription refill requests, have your pharmacy contact our office and allow 72 hours for refills to be completed.    Today you received the following chemotherapy and/or immunotherapy agents Kanjinti      To help prevent nausea and vomiting after your treatment, we encourage you to take your nausea medication as directed.  BELOW ARE SYMPTOMS THAT SHOULD BE REPORTED IMMEDIATELY: *FEVER GREATER THAN 100.4 F (38 C) OR HIGHER *CHILLS OR SWEATING *NAUSEA AND VOMITING THAT IS NOT CONTROLLED WITH YOUR NAUSEA MEDICATION *UNUSUAL SHORTNESS OF BREATH *UNUSUAL BRUISING OR BLEEDING *URINARY PROBLEMS (pain or burning when urinating, or frequent urination) *BOWEL PROBLEMS (unusual diarrhea, constipation, pain near the anus) TENDERNESS IN MOUTH AND THROAT WITH OR WITHOUT PRESENCE OF ULCERS (sore throat, sores in mouth, or a toothache) UNUSUAL RASH, SWELLING OR PAIN  UNUSUAL VAGINAL DISCHARGE OR ITCHING   Items with * indicate a potential emergency and should be followed up  as soon as possible or go to the Emergency Department if any problems should occur.  Please show the CHEMOTHERAPY ALERT CARD or IMMUNOTHERAPY ALERT CARD at check-in to the Emergency Department and triage nurse.  Should you have questions after your visit or need to cancel or reschedule your appointment, please contact [facility] [place] - A DEPT OF [facility] [phone]  and follow the prompts.  Office hours are 8:00 a.m. to 4:30 p.m. Monday - Friday. Please note that voicemails left after 4:00 p.m. may not be returned until the following business day.  We are closed weekends and major holidays. You have access to a nurse at all times for urgent questions. Please call the main number to the clinic [phone] and follow the prompts.  For any non-urgent questions, you may also contact your provider using MyChart. We now offer e-Visits for anyone 41 and older to request care online for non-urgent symptoms. For details visit [email or web address].   Also download the MyChart app! Go to the app store, search "MyChart", open the app, select [facility], and log in with your MyChart username and password.

## 2024-10-05 ENCOUNTER — Other Ambulatory Visit: Payer: Self-pay | Admitting: *Deleted

## 2024-10-05 DIAGNOSIS — C50412 Malignant neoplasm of upper-outer quadrant of left female breast: Secondary | ICD-10-CM

## 2024-10-05 MED ORDER — ANASTROZOLE 1 MG PO TABS: 1.0000 mg | ORAL_TABLET | Freq: Every day | ORAL | 5 refills | Status: AC

## 2024-10-05 NOTE — Telephone Encounter (Signed)
 Patient tolerating Anastrozole  and is to continue therapy per last OCN.

## 2024-10-13 ENCOUNTER — Telehealth: Payer: Self-pay

## 2024-10-13 NOTE — Telephone Encounter (Signed)
 Auth Submission: NO AUTH NEEDED Site of care: Site of care: [facility] Payer: [employer] medicare Medication & CPT/J Code(s) submitted: Reclast  (Zolendronic acid) [id number] Diagnosis Code:  Route of submission (phone, fax, portal): phone Phone # Fax # Auth type: Buy/Bill PB Units/visits requested: 5mg  x 1 dose Reference number: [member [id number] Approval from: 10/13/24 to 10/13/25

## 2024-10-15 ENCOUNTER — Other Ambulatory Visit (HOSPITAL_COMMUNITY): Payer: Self-pay | Admitting: Oncology

## 2024-10-15 ENCOUNTER — Encounter: Payer: Medicare Other | Attending: "Endocrinology | Admitting: *Deleted

## 2024-10-15 VITALS — BP 123/80 | HR 88 | Temp 97.4°F | Resp 16

## 2024-10-15 DIAGNOSIS — M810 Age-related osteoporosis without current pathological fracture: Secondary | ICD-10-CM

## 2024-10-15 MED ORDER — HEPARIN SOD (PORK) LOCK FLUSH 100 UNIT/ML IV SOLN
500.0000 [IU] | Freq: Once | INTRAVENOUS | Status: AC
  Administered 2024-10-15: 500 [IU] via INTRAVENOUS

## 2024-10-15 MED ORDER — ZOLEDRONIC ACID 5 MG/100ML IV SOLN
5.0000 mg | Freq: Once | INTRAVENOUS | Status: AC
  Administered 2024-10-15: 5 mg via INTRAVENOUS

## 2024-10-15 NOTE — Progress Notes (Signed)
 Diagnosis: Osteoporosis  Provider:  Mickiel Dry, MD  Procedure: IV Infusion  IV Type: Port a Cath, IV Location: R Chest  Reclast  (Zolendronic Acid), Dose: 5 mg  Infusion Start Time: 1033  Infusion Stop Time: 1101  Post Infusion IV Care: Port a Cath Deaccessed/Flushed  Discharge: Condition: Good, Destination: Home . AVS Declined  Performed by:  Baldwin Darice Helling, RN

## 2024-10-16 ENCOUNTER — Other Ambulatory Visit: Payer: Self-pay | Admitting: Oncology

## 2024-10-16 ENCOUNTER — Other Ambulatory Visit: Payer: Self-pay

## 2024-10-16 DIAGNOSIS — C50412 Malignant neoplasm of upper-outer quadrant of left female breast: Secondary | ICD-10-CM

## 2024-10-23 ENCOUNTER — Inpatient Hospital Stay

## 2024-10-23 ENCOUNTER — Inpatient Hospital Stay: Admitting: Oncology

## 2024-10-23 VITALS — BP 147/84 | HR 71 | Resp 19

## 2024-10-23 VITALS — BP 105/76 | HR 74 | Temp 98.0°F | Resp 18 | Wt 104.7 lb

## 2024-10-23 DIAGNOSIS — M2550 Pain in unspecified joint: Secondary | ICD-10-CM | POA: Diagnosis not present

## 2024-10-23 DIAGNOSIS — M810 Age-related osteoporosis without current pathological fracture: Secondary | ICD-10-CM

## 2024-10-23 DIAGNOSIS — C50412 Malignant neoplasm of upper-outer quadrant of left female breast: Secondary | ICD-10-CM | POA: Diagnosis not present

## 2024-10-23 DIAGNOSIS — Z170 Estrogen receptor positive status [ER+]: Secondary | ICD-10-CM | POA: Diagnosis not present

## 2024-10-23 DIAGNOSIS — Z5112 Encounter for antineoplastic immunotherapy: Secondary | ICD-10-CM | POA: Diagnosis not present

## 2024-10-23 DIAGNOSIS — Z720 Tobacco use: Secondary | ICD-10-CM | POA: Diagnosis not present

## 2024-10-23 DIAGNOSIS — I8290 Acute embolism and thrombosis of unspecified vein: Secondary | ICD-10-CM

## 2024-10-23 LAB — COMPREHENSIVE METABOLIC PANEL WITH GFR
ALT: 7 U/L (ref 0–44)
AST: 24 U/L (ref 15–41)
Albumin: 4.1 g/dL (ref 3.5–5.0)
Alkaline Phosphatase: 83 U/L (ref 38–126)
Anion gap: 13 (ref 5–15)
BUN: 16 mg/dL (ref 8–23)
CO2: 25 mmol/L (ref 22–32)
Calcium: 9.3 mg/dL (ref 8.9–10.3)
Chloride: 106 mmol/L (ref 98–111)
Creatinine, Ser: 0.58 mg/dL (ref 0.44–1.00)
GFR, Estimated: >60 mL/min
Glucose, Bld: 107 mg/dL — ABNORMAL HIGH (ref 70–99)
Potassium: 4.2 mmol/L (ref 3.5–5.1)
Sodium: 143 mmol/L (ref 135–145)
Total Bilirubin: <0.2 mg/dL (ref 0.0–1.2)
Total Protein: 6.5 g/dL (ref 6.5–8.1)

## 2024-10-23 LAB — CBC WITH DIFFERENTIAL/PLATELET
Abs Immature Granulocytes: 0.01 10*3/uL (ref 0.00–0.07)
Basophils Absolute: 0.1 10*3/uL (ref 0.0–0.1)
Basophils Relative: 1 %
Eosinophils Absolute: 0.1 10*3/uL (ref 0.0–0.5)
Eosinophils Relative: 2 %
HCT: 42.3 % (ref 36.0–46.0)
Hemoglobin: 13.6 g/dL (ref 12.0–15.0)
Immature Granulocytes: 0 %
Lymphocytes Relative: 23 %
Lymphs Abs: 1.5 10*3/uL (ref 0.7–4.0)
MCH: 32.1 pg (ref 26.0–34.0)
MCHC: 32.2 g/dL (ref 30.0–36.0)
MCV: 99.8 fL (ref 80.0–100.0)
Monocytes Absolute: 0.4 10*3/uL (ref 0.1–1.0)
Monocytes Relative: 7 %
Neutro Abs: 4.4 10*3/uL (ref 1.7–7.7)
Neutrophils Relative %: 67 %
Platelets: 229 10*3/uL (ref 150–400)
RBC: 4.24 MIL/uL (ref 3.87–5.11)
RDW: 13.5 % (ref 11.5–15.5)
WBC: 6.6 10*3/uL (ref 4.0–10.5)
nRBC: 0 % (ref 0.0–0.2)

## 2024-10-23 LAB — MAGNESIUM: Magnesium: 2.2 mg/dL (ref 1.7–2.4)

## 2024-10-23 MED ORDER — SODIUM CHLORIDE 0.9 % IV SOLN: INTRAVENOUS | Status: DC

## 2024-10-23 MED ORDER — DIPHENHYDRAMINE HCL 25 MG PO TABS: 25.0000 mg | ORAL_TABLET | Freq: Once | ORAL | Status: DC

## 2024-10-23 MED ORDER — DIPHENHYDRAMINE HCL 25 MG PO CAPS
25.0000 mg | ORAL_CAPSULE | Freq: Once | ORAL | Status: DC
  Filled 2024-10-23: qty 1

## 2024-10-23 MED ORDER — TRASTUZUMAB-ANNS CHEMO 150 MG IV SOLR
6.0000 mg/kg | Freq: Once | INTRAVENOUS | Status: AC
  Administered 2024-10-23: 300 mg via INTRAVENOUS
  Filled 2024-10-23: qty 14.29

## 2024-10-23 MED ORDER — ACETAMINOPHEN 325 MG PO TABS: 650.0000 mg | ORAL_TABLET | Freq: Once | ORAL | Status: DC

## 2024-10-23 NOTE — Progress Notes (Signed)
 Patients port flushed without difficulty.  Good blood return noted with no bruising or swelling noted at site. Patient remains accessed for treatment.

## 2024-10-23 NOTE — Patient Instructions (Signed)
 [facility] [place] - A DEPT OF [facility]  Discharge Instructions: Thank you for choosing [facility] to provide your oncology and hematology care.  If you have a lab appointment with the Cancer Center - please note that after April 8th, 2024, all labs will be drawn in the cancer center.  You do not have to check in or register with the main entrance as you have in the past but will complete your check-in in the cancer center.  Wear comfortable clothing and clothing appropriate for easy access to any Portacath or PICC line.   We strive to give you quality time with your provider. You may need to reschedule your appointment if you arrive late (15 or more minutes).  Arriving late affects you and other patients whose appointments are after yours.  Also, if you miss three or more appointments without notifying the office, you may be dismissed from the clinic at the provider's discretion.      For prescription refill requests, have your pharmacy contact our office and allow 72 hours for refills to be completed.    Today you received the following chemotherapy and/or immunotherapy agents Kanjinti      To help prevent nausea and vomiting after your treatment, we encourage you to take your nausea medication as directed.  BELOW ARE SYMPTOMS THAT SHOULD BE REPORTED IMMEDIATELY: *FEVER GREATER THAN 100.4 F (38 C) OR HIGHER *CHILLS OR SWEATING *NAUSEA AND VOMITING THAT IS NOT CONTROLLED WITH YOUR NAUSEA MEDICATION *UNUSUAL SHORTNESS OF BREATH *UNUSUAL BRUISING OR BLEEDING *URINARY PROBLEMS (pain or burning when urinating, or frequent urination) *BOWEL PROBLEMS (unusual diarrhea, constipation, pain near the anus) TENDERNESS IN MOUTH AND THROAT WITH OR WITHOUT PRESENCE OF ULCERS (sore throat, sores in mouth, or a toothache) UNUSUAL RASH, SWELLING OR PAIN  UNUSUAL VAGINAL DISCHARGE OR ITCHING   Items with * indicate a potential emergency and should be followed up  as soon as possible or go to the Emergency Department if any problems should occur.  Please show the CHEMOTHERAPY ALERT CARD or IMMUNOTHERAPY ALERT CARD at check-in to the Emergency Department and triage nurse.  Should you have questions after your visit or need to cancel or reschedule your appointment, please contact [facility] [place] - A DEPT OF [facility] [phone]  and follow the prompts.  Office hours are 8:00 a.m. to 4:30 p.m. Monday - Friday. Please note that voicemails left after 4:00 p.m. may not be returned until the following business day.  We are closed weekends and major holidays. You have access to a nurse at all times for urgent questions. Please call the main number to the clinic [phone] and follow the prompts.  For any non-urgent questions, you may also contact your provider using MyChart. We now offer e-Visits for anyone 41 and older to request care online for non-urgent symptoms. For details visit [email or web address].   Also download the MyChart app! Go to the app store, search "MyChart", open the app, select [facility], and log in with your MyChart username and password.

## 2024-10-23 NOTE — Progress Notes (Signed)
 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  Will continue anastrozole  1 mg daily - Will continue Herceptin  every 3 weeks for a total of 52 weeks. Last treatment today. - Last mammogram 08/2024: Benign.  Will repeat in December 2026. - Last DEXA scan was in 07/2023 with a T-score of -4.3.  Will repeat in 2 years I.e 07/2025 - Continue vitamin D  and calcium  - Last echo from 09/2024: LVEF: 60-65%.    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  Will continue for a lifetime because of recurrent thrombosis also in the setting of  breast carcinoma  Osteoporosis Last DEXA scan from 07/2023 with a T-score of -4.3 Gets Reclast /zoledronic  acid injections every year.  Last one was in 09/2023   - Patient receiving Zometa  at the infusion center.  Will transition to here. - Continue calcium  and vitamin D  - Will repeat DEXA scan in 2 years that is 07/2025  Nicotine dependence Intermittent tobacco use. Counseling provided on cessation.  - Provided smoking cessation counseling.  Arthralgia Intermittent arthralgia, exacerbated in winter, managed with acetaminophen .  - Advised continuation of acetaminophen  (Tylenol ) as needed for arthralgia.   The patient understands the plans discussed today and is in agreement with them.  She knows to contact our office if she develops concerns prior to her next appointment.  The total time spent in the appointment was 20 minutes for the encounter  with patient, including review of chart and various tests results, discussions about plan of care and coordination of care plan   Mickiel Dry, MD  [facility] [place] - A DEPT OF [facility] [address] [place] [address] Dept: [phone] Dept Fax: [phone]   No orders of the defined types were placed in this encounter.    ONCOLOGY HISTORY:   I have reviewed her chart and materials related to her cancer extensively and collaborated history with the patient. 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 -08/30/2023: Left breast core biopsy.  Pathology: Invasive ductal carcinoma, 1.7 cm. DCIS solid and cribriform types, intermediate nuclear grade. No lymphovascular invasion identified. No  calcifications identified. Tumor cells are ER positive (95%), Her2 positive (3+), and PR negative. Ki-67 is 5%.  -09/06/2023: CT chest: No new or progressive interval findings to suggest metastatic disease in the chest. -09/26/2023: Left breast lumpectomy and SNLB.  Pathology: Invasive ductal carcinoma, 2.4 cm, grade 2. Ductal carcinoma in situ, intermediate-grade. Resection margins are negative for carcinoma. Negative for lymphovascular or perineural invasion. Three lymph nodes, negative for carcinoma (0/3). Pathologic staging: pT2, pN0  -11/04/2023-02/18/2024: 6 cycles of TCH  -02/19/2024-current: Maintenance Herceptin  every 3 weeks -03/31/2024-current: Anastrozole  1mg  daily -05/01/2024-05/06/2024: Radiation therapy to left breast and chest wall  Current Treatment: Maintenance Herceptin  plus anastrozole   INTERVAL HISTORY:   Discussed the use of AI scribe software for clinical note transcription with the patient, who gave verbal consent to proceed.  History of Present Illness Carsynn Henson is a 74 year old female with stage IIa left breast invasive ductal carcinoma (ER positive, PR negative, HER2 positive) who presents for completion of adjuvant trastuzumab  and post-treatment follow-up.  She has completed a full year of adjuvant therapy for left breast invasive ductal carcinoma, including lumpectomy, TCH chemotherapy, radiation, and maintenance trastuzumab , with her final infusion occurring at this visit. She remains on anastrozole  and continues calcium  and vitamin D  supplementation for bone health.  She is currently asymptomatic, tolerates oral intake without difficulty, and denies vasomotor symptoms while on anastrozole .  She expresses significant relief at completing therapy. Her next mammogram is scheduled for December 2026.   I have reviewed the past medical history, past surgical history, social history and family history with the patient and they are unchanged from previous  note.  ALLERGIES:  is allergic to asa [aspirin], eliquis [apixaban], and oxycodone .  MEDICATIONS:  Current Outpatient Medications  Medication Sig Dispense Refill   acetaminophen  (TYLENOL ) 500 MG tablet Take 500 mg by mouth every 6 (six) hours as needed for mild pain, moderate pain or headache.     anastrozole  (ARIMIDEX ) 1 MG tablet Take 1 tablet (1 mg total) by mouth daily. 30 tablet 5   calcium -vitamin D  (OSCAL WITH D) 500-5 MG-MCG tablet Take 1 tablet by mouth 2 (two) times daily with a meal. 180 tablet 1   Cholecalciferol  (VITAMIN D ) 50 MCG (2000 UT) tablet Take 2,000 Units by mouth daily.     gabapentin  (NEURONTIN ) 100 MG capsule Take 100 mg by mouth 3 (three) times daily.     lidocaine -prilocaine  (EMLA ) cream Apply to affected area once 30 g 3   melatonin 5 MG TABS Take 5 mg by mouth at bedtime as needed (sleep).     Melatonin-Pyridoxine 5-1 MG TABS Take 5 mg by mouth.     Multiple Vitamins-Minerals (MULTIVITAMIN WITH MINERALS) tablet Take 1 tablet by mouth daily.     rivaroxaban  (XARELTO ) 20 MG TABS tablet Take 1 tablet (20 mg total) by mouth daily with supper. 30 tablet 12   rosuvastatin  (CRESTOR ) 5 MG tablet Take 1 tablet (5 mg total) by mouth daily. 90 tablet 3   sertraline  (ZOLOFT ) 50 MG tablet Take 50 mg by mouth at bedtime. (Patient taking differently: Take 50 mg by mouth at bedtime. Take one and a half tablets a day.)     tamsulosin  (FLOMAX ) 0.4 MG CAPS capsule Take 1 capsule (0.4 mg  total) by mouth at bedtime. 90 capsule 3   Trastuzumab -anns (KANJINTI  IV) Inject into the vein.     No current facility-administered medications for this visit.   VITALS:  There were no vitals taken for this visit.  Wt Readings from Last 3 Encounters:  10/02/24 106 lb 7.7 oz (48.3 kg)  09/09/24 106 lb 3.2 oz (48.2 kg)  08/19/24 105 lb 11.2 oz (47.9 kg)    There is no height or weight on file to calculate BMI.  Performance status (ECOG): 0 - Asymptomatic  PHYSICAL EXAM:   GENERAL:alert,  no distress and comfortable SKIN: skin color, texture, turgor are normal, no rashes or significant lesions LYMPH:  no palpable lymphadenopathy in the cervical, axillary or inguinal LUNGS: clear to auscultation and percussion with normal breathing effort HEART: regular rate & rhythm and no murmurs and no lower extremity edema ABDOMEN:abdomen soft, non-tender and normal bowel sounds Musculoskeletal:no cyanosis of digits and no clubbing  NEURO: alert & oriented x 3 with fluent speech  LABORATORY DATA:  I have reviewed the data as listed   Lab Results  Component Value Date   WBC 5.4 10/02/2024   NEUTROABS 3.4 10/02/2024   HGB 13.9 10/02/2024   HCT 43.7 10/02/2024   MCV 100.5 (H) 10/02/2024   PLT 230 10/02/2024      Chemistry      Component Value Date/Time   NA 142 10/02/2024 0915   NA 142 09/02/2024 1025   K 4.3 10/02/2024 0915   CL 104 10/02/2024 0915   CO2 25 10/02/2024 0915   BUN 16 10/02/2024 0915   BUN 16 09/02/2024 1025   CREATININE 0.63 10/02/2024 0915      Component Value Date/Time   CALCIUM  9.1 10/02/2024 0915   ALKPHOS 78 10/02/2024 0915   AST 26 10/02/2024 0915   ALT 9 10/02/2024 0915   BILITOT 0.3 10/02/2024 0915   BILITOT 0.3 09/02/2024 1025       RADIOGRAPHIC STUDIES: I have personally reviewed the radiological images as listed and agreed with the findings in the report.   "

## 2024-10-23 NOTE — Progress Notes (Signed)
 Patient tolerated chemotherapy with no complaints voiced.  Side effects with management reviewed with understanding verbalized.  Port site clean and dry with no bruising or swelling noted at site.  Good blood return noted before and after administration of chemotherapy.  Band aid applied.  Patient left in satisfactory condition with VSS and no s/s of distress noted. All follow ups as scheduled.   Venkat Ankney [employer]

## 2025-01-18 ENCOUNTER — Inpatient Hospital Stay

## 2025-01-25 ENCOUNTER — Inpatient Hospital Stay: Admitting: Oncology

## 2025-03-11 ENCOUNTER — Ambulatory Visit: Admitting: "Endocrinology

## 2025-05-21 ENCOUNTER — Ambulatory Visit

## 2025-10-15 ENCOUNTER — Ambulatory Visit
